# Patient Record
Sex: Female | Born: 1970 | Race: Black or African American | Hispanic: No | Marital: Married | Smoking: Former smoker
Health system: Southern US, Community
[De-identification: ages and names within clinical notes are randomized; demographics above are authoritative.]

## PROBLEM LIST (undated history)

## (undated) DIAGNOSIS — Z853 Personal history of malignant neoplasm of breast: Secondary | ICD-10-CM

## (undated) DIAGNOSIS — Z9221 Personal history of antineoplastic chemotherapy: Secondary | ICD-10-CM

## (undated) DIAGNOSIS — Z1379 Encounter for other screening for genetic and chromosomal anomalies: Secondary | ICD-10-CM

## (undated) HISTORY — DX: Encounter for other screening for genetic and chromosomal anomalies: Z13.79

---

## 1998-03-17 ENCOUNTER — Other Ambulatory Visit: Admission: RE | Admit: 1998-03-17 | Discharge: 1998-03-17 | Payer: Self-pay | Admitting: Obstetrics and Gynecology

## 1998-04-28 ENCOUNTER — Ambulatory Visit (HOSPITAL_COMMUNITY): Admission: AD | Admit: 1998-04-28 | Discharge: 1998-04-28 | Payer: Self-pay | Admitting: Obstetrics and Gynecology

## 1998-07-22 ENCOUNTER — Inpatient Hospital Stay (HOSPITAL_COMMUNITY): Admission: AD | Admit: 1998-07-22 | Discharge: 1998-07-22 | Payer: Self-pay | Admitting: Obstetrics and Gynecology

## 1998-08-03 ENCOUNTER — Inpatient Hospital Stay (HOSPITAL_COMMUNITY): Admission: AD | Admit: 1998-08-03 | Discharge: 1998-08-03 | Payer: Self-pay | Admitting: *Deleted

## 1998-08-04 ENCOUNTER — Inpatient Hospital Stay (HOSPITAL_COMMUNITY): Admission: AD | Admit: 1998-08-04 | Discharge: 1998-08-08 | Payer: Self-pay | Admitting: Obstetrics and Gynecology

## 1998-09-14 ENCOUNTER — Other Ambulatory Visit: Admission: RE | Admit: 1998-09-14 | Discharge: 1998-09-14 | Payer: Self-pay | Admitting: Obstetrics and Gynecology

## 1999-10-25 ENCOUNTER — Other Ambulatory Visit: Admission: RE | Admit: 1999-10-25 | Discharge: 1999-10-25 | Payer: Self-pay | Admitting: Obstetrics and Gynecology

## 2001-06-12 ENCOUNTER — Other Ambulatory Visit: Admission: RE | Admit: 2001-06-12 | Discharge: 2001-06-12 | Payer: Self-pay | Admitting: Obstetrics & Gynecology

## 2002-12-05 ENCOUNTER — Other Ambulatory Visit: Admission: RE | Admit: 2002-12-05 | Discharge: 2002-12-05 | Payer: Self-pay | Admitting: Obstetrics & Gynecology

## 2003-12-11 ENCOUNTER — Other Ambulatory Visit: Admission: RE | Admit: 2003-12-11 | Discharge: 2003-12-11 | Payer: Self-pay | Admitting: Obstetrics & Gynecology

## 2005-01-27 ENCOUNTER — Other Ambulatory Visit: Admission: RE | Admit: 2005-01-27 | Discharge: 2005-01-27 | Payer: Self-pay | Admitting: Obstetrics & Gynecology

## 2006-01-30 ENCOUNTER — Ambulatory Visit: Payer: Self-pay | Admitting: Family Medicine

## 2006-10-17 ENCOUNTER — Inpatient Hospital Stay (HOSPITAL_COMMUNITY): Admission: AD | Admit: 2006-10-17 | Discharge: 2006-10-17 | Payer: Self-pay | Admitting: Obstetrics and Gynecology

## 2007-03-14 ENCOUNTER — Ambulatory Visit (HOSPITAL_COMMUNITY): Admission: RE | Admit: 2007-03-14 | Discharge: 2007-03-14 | Payer: Self-pay | Admitting: Obstetrics & Gynecology

## 2007-05-28 ENCOUNTER — Inpatient Hospital Stay (HOSPITAL_COMMUNITY): Admission: RE | Admit: 2007-05-28 | Discharge: 2007-05-31 | Payer: Self-pay | Admitting: Obstetrics & Gynecology

## 2008-09-12 ENCOUNTER — Ambulatory Visit: Payer: Self-pay | Admitting: Family Medicine

## 2008-09-12 DIAGNOSIS — J029 Acute pharyngitis, unspecified: Secondary | ICD-10-CM | POA: Insufficient documentation

## 2008-09-17 ENCOUNTER — Encounter (INDEPENDENT_AMBULATORY_CARE_PROVIDER_SITE_OTHER): Payer: Self-pay | Admitting: *Deleted

## 2010-01-22 ENCOUNTER — Ambulatory Visit: Payer: Self-pay | Admitting: Family Medicine

## 2010-01-22 DIAGNOSIS — N760 Acute vaginitis: Secondary | ICD-10-CM | POA: Insufficient documentation

## 2010-01-22 DIAGNOSIS — R109 Unspecified abdominal pain: Secondary | ICD-10-CM | POA: Insufficient documentation

## 2010-01-22 LAB — CONVERTED CEMR LAB
Bilirubin Urine: NEGATIVE
Ketones, urine, test strip: NEGATIVE
Protein, U semiquant: NEGATIVE
Urobilinogen, UA: 0.2
pH: 5

## 2010-01-23 ENCOUNTER — Encounter: Payer: Self-pay | Admitting: Family Medicine

## 2010-01-25 ENCOUNTER — Telehealth (INDEPENDENT_AMBULATORY_CARE_PROVIDER_SITE_OTHER): Payer: Self-pay | Admitting: *Deleted

## 2010-01-25 LAB — CONVERTED CEMR LAB: Chlamydia, Swab/Urine, PCR: NEGATIVE

## 2010-02-12 ENCOUNTER — Encounter: Admission: RE | Admit: 2010-02-12 | Discharge: 2010-02-12 | Payer: Self-pay | Admitting: Family Medicine

## 2010-02-12 ENCOUNTER — Telehealth (INDEPENDENT_AMBULATORY_CARE_PROVIDER_SITE_OTHER): Payer: Self-pay | Admitting: *Deleted

## 2010-12-07 NOTE — Progress Notes (Signed)
Summary: Lab results   Phone Note Outgoing Call   Call placed by: Army Fossa CMA,  January 25, 2010 10:13 AM Reason for Call: Discuss lab or test results Summary of Call: Regarding results, LMTCB:  [place], GC probe, Culture- all normal  Follow-up for Phone Call        Pt is aware. Army Fossa CMA  January 27, 2010 1:22 PM

## 2010-12-07 NOTE — Progress Notes (Signed)
Summary: [place] Results  Phone Note Outgoing Call   Reason for Call: Discuss lab or test results Summary of Call: to discuss lab results  essentially negative   Signed by Loreen Freud DO on 02/12/2010 at 10:58 AM  LMTCB Harold Barban  February 12, 2010 3:20 PM  Initial call taken by: Harold Barban,  February 12, 2010 3:20 PM  Follow-up for Phone Call        [facility]. Army Fossa CMA  February 15, 2010 10:50 AM   Additional Follow-up for Phone Call Additional follow up Details #1::        Pt is aware. Army Fossa CMA  February 15, 2010 11:52 AM

## 2010-12-07 NOTE — Assessment & Plan Note (Signed)
Summary: pain during menstural cycle//lch   Vital Signs:  Patient profile:   40 year old female Height:      65 inches Weight:      141 pounds BMI:     23.55 Pulse rate:   82 / minute Pulse rhythm:   regular BP sitting:   102 / 70  (left arm) Cuff size:   regular  Vitals Entered By: Army Fossa CMA (January 22, 2010 2:59 PM) CC: Olivia Frederick c/o very painful cramps during period. x 2-3 months.    History of Present Illness: Olivia Frederick here c/o cramps with period .   It starts few days before and last about a week.  Periods only last about 3 days.  Olivia Frederick has taken Ibuprofen with little relief.   Pap in May was normal.  No d/c.     Current Medications (verified): 1)  Metrogel-Vaginal 0.75 % Gel (Metronidazole) .Marland Kitchen.. 1 Applicator Pv At Bedtime  Allergies (verified): No Known Drug Allergies  Past History:  Past medical, surgical, family and social histories (including risk factors) reviewed for relevance to current acute and chronic problems.  Family History: Reviewed history and no changes required.  Social History: Reviewed history and no changes required.  Review of Systems      See HPI  Physical Exam  General:  Well-developed,well-nourished,in no acute distress; alert,appropriate and cooperative throughout examination Abdomen:  Bowel sounds positive,abdomen soft and non-tender without masses, organomegaly or hernias noted. Genitalia:  normal introitus, no external lesions, normal uterus size and position, no adnexal masses or tenderness, and vaginal discharge.   Psych:  Oriented X3 and normally interactive.     Impression & Recommendations:  Problem # 1:  PELVIC PAIN, RIGHT (ICD-789.09)  Discussed use of medications, application of heat or cold, and exercises.   Orders: T-Culture, Urine ([id number]) T-GC Probe, urine [id number]) T-Chlamydia  Probe, urine [id number]) Wet Prep ([id number]) UA Dipstick w/o Micro (manual) (29528) Radiology Referral (Radiology)  Problem # 2:   VAGINITIS, BACTERIAL (ICD-616.10)  Her updated medication list for this problem includes:    Metrogel-vaginal 0.75 % Gel (Metronidazole) .Marland Kitchen... 1 applicator pv at bedtime  Discussed symptomatic relief and treatment options.   Complete Medication List: 1)  Metrogel-vaginal 0.75 % Gel (Metronidazole) .Marland Kitchen.. 1 applicator pv at bedtime Prescriptions: METROGEL-VAGINAL 0.75 % GEL (METRONIDAZOLE) 1 applicator pv at bedtime  #5 days x 0   Entered and Authorized by:   Loreen Freud DO   Signed by:   Loreen Freud DO on 01/22/2010   Method used:   Electronically to        [facility] [phone]* (retail)       [address]       [place], [place]  [address]       Ph: [phone]       Fax: [phone]   RxID:   [id number]   Laboratory Results   Urine Tests    Routine Urinalysis   Color: yellow Appearance: Clear Glucose: negative   (Normal Range: Negative) Bilirubin: negative   (Normal Range: Negative) Ketone: negative   (Normal Range: Negative) Spec. Gravity: 1.015   (Normal Range: 1.003-1.035) Blood: trace-lysed   (Normal Range: Negative) pH: 5.0   (Normal Range: 5.0-8.0) Protein: negative   (Normal Range: Negative) Urobilinogen: 0.2   (Normal Range: 0-1) Nitrite: negative   (Normal Range: Negative) Leukocyte Esterace: negative   (Normal Range: Negative)    Comments: Army Fossa CMA  January 22, 2010 3:29 PM     Appended Document: pain during menstural cycle//lch  Laboratory Results   Urine Tests   Date/Time Reported: January 22, 2010 3:49 PM     Urine HCG: negative Comments: Floydene Flock  January 22, 2010 3:50 PM

## 2011-03-25 NOTE — H&P (Signed)
Olivia Frederick, Olivia Frederick         ACCOUNT NO.:  [id number]   MEDICAL RECORD NO.:  [id number]          PATIENT TYPE:  INP   LOCATION:  NA                            [facility]   PHYSICIAN:  Freddy Finner, M.D.   DATE OF BIRTH:  1971-04-06   DATE OF ADMISSION:  DATE OF DISCHARGE:                              HISTORY & PHYSICAL   ADMISSION DIAGNOSES:  1. Intrauterine pregnancy at 38-1/[redacted] weeks gestation.  2. Surgically scarred uterus.  3. For repeat cesarean delivery.   The patient is a 40 year old gravida 2, para 1 whose prenatal has been  uncomplicated.  She delivered her first child by cesarean for placental  abruption.  She is admitted now for repeat cesarean delivery.  Her  prenatal course has been uncomplicated.   REVIEW OF SYSTEMS:  Cardiopulmonary, GI, GU are negative.   PAST MEDICAL HISTORY:  Recorded in the prenatal summary and not repeated  here.   FAMILY HISTORY:  Recorded in the prenatal summary and not repeated here.   PHYSICAL EXAMINATION:  HEENT:  Grossly within normal limits.  NECK:  The thyroid gland is not palpably enlarged.  VITAL SIGNS:  Blood pressure in the office 114/84.  CHEST:  Clear to auscultation.  HEART:  Normal sinus rhythm without murmurs, rubs, or gallops.  ABDOMEN:  Remarkable for gravid state.  Estimated fetal size of 8 pounds  or more.  EXTREMITIES:  +1 edema without cyanosis or clubbing.  PELVIC:  Exam is not performed.   ASSESSMENT:  1. Intrauterine pregnancy at term, uncomplicated prenatal course.  2. Surgically scarred uterus.   PLAN:  Repeat cesarean delivery.      Freddy Finner, M.D.  Electronically Signed     WRN/MEDQ  D:  05/24/2007  T:  05/24/2007  Job:  [id number]

## 2011-03-25 NOTE — Discharge Summary (Signed)
NAMEPAMELLA, SAMONS         ACCOUNT NO.:  [id number]   MEDICAL RECORD NO.:  [id number]          PATIENT TYPE:  INP   LOCATION:  9101                          [facility]   PHYSICIAN:  Freddy Finner, M.D.   DATE OF BIRTH:  May 24, 1971   DATE OF ADMISSION:  05/28/2007  DATE OF DISCHARGE:  05/31/2007                               DISCHARGE SUMMARY   ADMISSION DIAGNOSES:  1. Intrauterine pregnancy at term.  2. Previous cesarean delivery, desires repeat.   DISCHARGE DIAGNOSIS:  Status post low transverse cesarean section with a  viable female infant.   PROCEDURE:  Repeat low transverse cesarean section.   REASON FOR ADMISSION:  Please see dictated H&P.   HOSPITAL COURSE:  The patient 40 year old black married female gravida  2, para 1 that was admitted to [facility] all for scheduled  cesarean section.  The patient had had a previous cesarean delivery and  desired repeat.  On the morning of admission the patient was taken to  operating room where spinal anesthesia was administered without  difficulty.  Low transverse incision was made with delivery of a viable  female infant weighing 7 pounds 14 ounces with Apgars of 9 at 1 and 9 at  5 minutes.  The patient tolerated procedure well and taken to the  recovery room in stable condition.  On postoperative day #1 the patient  was without complaint.  Vital signs were stable.  She is afebrile.  Abdomen soft with good return of bowel function.  Fundus was firm and  nontender.  Abdominal dressing was noted to be clean, dry and intact.  Laboratory findings revealed hemoglobin of 8.7.  CBC was ordered for the  following a.m.  On postoperative day #2 the patient was without  complaint.  She denied any dizziness.  Vital signs were stable.  Heart  rate was 71-83.  Abdomen soft.  Fundus firm and nontender.  Incision was  noted to have a small amount of oozing noted in the midpoint.  She is  ambulating well.  Laboratory findings  revealed hemoglobin of 7.4,  platelet count 175,000, WBC count of 9.3.  The patient was started on  some iron supplementation and CBC was ordered for the following morning.  On postoperative day #3 the patient did complain of some peripheral  edema.  She denied headache or blurred vision.  Vital signs were stable.  She was afebrile.  Blood pressure was 127/71.  Abdomen soft.  Fundus  firm and even incision was noted to have a scant amount of moisture  noted from the midpoint.  Staples were left in place. The patient was  ambulating well, tolerating a regular diet without complaints of nausea,  vomiting.  Laboratory findings revealed hemoglobin of 7.8, platelet  count 199,000, WBC count of 9.4.  Instructions were reviewed and the  patient was later discharged home.   CONDITION ON DISCHARGE:  Stable, diet regular as tolerated.   ACTIVITY:  No heavy lifting, no driving x2 weeks.  A vaginal entry.   FOLLOW-UP:  She is to follow up in the office in 3-4 days for staple  removal.  She is to call for temperature greater than 100 degrees,  persistent nausea, vomiting, heavy vaginal bleeding and/or redness or  drainage from incisional site.   DISCHARGE MEDICATIONS:  1. Percocet 5/325 #30 one p.o. q.4-6 hours.  2. Motrin 600 mg every 6 hours.  3. Prenatal vitamins one p.o. daily.  4. Tandem one p.o. daily p.r.n.      Julio Sicks, N.P.      Freddy Finner, M.D.  Electronically Signed    CC/MEDQ  D:  06/21/2007  T:  06/21/2007  Job:  [id number]

## 2011-03-25 NOTE — Op Note (Signed)
Olivia Frederick, Olivia Frederick         ACCOUNT NO.:  [id number]   MEDICAL RECORD NO.:  [id number]          PATIENT TYPE:  INP   LOCATION:  9101                          [facility]   PHYSICIAN:  Freddy Finner, M.D.   DATE OF BIRTH:  [birth year]   DATE OF PROCEDURE:  05/28/2007  DATE OF DISCHARGE:                               OPERATIVE REPORT   PREOPERATIVE DIAGNOSES:  1. Intrauterine pregnancy at term.  2. Surgically scarred uterus by previous cesarean delivery.   POSTOPERATIVE DIAGNOSES:  1. Intrauterine pregnancy at term.  2. Surgically scarred uterus by previous cesarean delivery.  3. Subserosal leiomyomata.   OPERATIVE PROCEDURE:  Repeat low transverse cervical cesarean section  with delivery of viable female infant, Apgars of 9 and 9, birth weight 7  pounds 14 ounces.   SURGEON:  Freddy Finner, M.D.   ANESTHESIA:  Spinal.   ESTIMATED INTRAOPERATIVE BLOOD LOSS:  Less than or equal to 800 mL.   INTRAOPERATIVE COMPLICATIONS:  None.   The patient is a 40 year old black married female, gravida 2, para 1 by  cesarean, who has been followed through an uneventful prenatal course  and is admitted now at 38-1/2 weeks' gestation for repeat cesarean  delivery.  She was admitted on the morning of surgery.  She was taken to  the operating room, there placed under adequate spinal anesthesia,  placed in the dorsal recumbent position with elevation of the right hip  by 15 degrees.  Abdomen was prepped and draped in the usual fashion,  then Foley catheter placed using sterile technique.  A transverse lower  abdominal incision was made through an old scar and carried sharply down  to the fascia.  The fascia was entered sharply and extended to the  extent of skin incision.  Rectus sheath was developed superiorly and  inferiorly with blunt and sharp dissection.  Rectus muscles were divided  in the midline, peritoneum was elevated and entered sharply and extended  bluntly to the extent of  skin incision.  The old scar could be  visualized above the bladder reflection and for that reason, the bladder  flap was not developed.  A transverse incision was made in the lower  uterine segment, then extended into the amnion.  The fluid was clear.  The incision was extended bluntly in a transverse direction.  A Kiwi  vacuum with assist device was then used and easy delivery of the fetal  head was accomplished without difficulty.  A single loose loop of nuchal  cord was reduced.  The nostrils and mouth were bulbed before delivery of  the shoulders.  A viable female infant was then delivered.  Statistics  were noted above.  The placenta and other products of conception were  removed and taken off the table for placental cord donation.  The uterus  was delivered onto the anterior abdominal wall.  There were two small  subserosal leiomyomata measuring less than 6 cm each.  Both were on the  right side and one along the right posterior surface of the fundus.  Careful examination of the uterine cavity confirmed complete evacuation  of products of  conception.  The incision was then closed in a double  layer with running locking Monocryl or the first layer and an  imbricating suture of 0 Monocryl for the second layer.  The uterus was  delivered back into the abdominal cavity.  Hemostasis was complete.  Irrigation was carried out.  All pack, needle and instrument counts were  correct.  The abdominal incision was then closed in layers.  Running 0  Monocryl was used to close the peritoneum and reapproximate the rectus  muscles.  The fascia was closed with running 0 PDS running from angle to  angle on either side.  The subcutaneous tissue was approximated with a  running 2-0 plain suture.  The skin was closed with broad skin staples  and quarter-inch Steri-Strips.  The patient tolerated the operative  procedure well.  She was taken to recovery in good condition.      Freddy Finner, M.D.   Electronically Signed     WRN/MEDQ  D:  05/28/2007  T:  05/28/2007  Job:  [id number]

## 2011-06-10 ENCOUNTER — Ambulatory Visit: Payer: Self-pay | Admitting: Internal Medicine

## 2011-06-10 ENCOUNTER — Encounter: Payer: Self-pay | Admitting: Internal Medicine

## 2011-06-10 ENCOUNTER — Ambulatory Visit (INDEPENDENT_AMBULATORY_CARE_PROVIDER_SITE_OTHER): Payer: 59 | Admitting: Internal Medicine

## 2011-06-10 VITALS — BP 98/66 | HR 61 | Temp 98.6°F | Wt 147.2 lb

## 2011-06-10 DIAGNOSIS — H9190 Unspecified hearing loss, unspecified ear: Secondary | ICD-10-CM

## 2011-06-10 DIAGNOSIS — H6120 Impacted cerumen, unspecified ear: Secondary | ICD-10-CM

## 2011-06-10 DIAGNOSIS — H6122 Impacted cerumen, left ear: Secondary | ICD-10-CM

## 2011-06-10 DIAGNOSIS — H9192 Unspecified hearing loss, left ear: Secondary | ICD-10-CM

## 2011-06-10 NOTE — Progress Notes (Signed)
  Subjective:    Patient ID: Olivia Frederick, female    DOB: 1971/07/17, 40 y.o.   MRN: [id number]  HPI she has had a hearing loss on the left which began while she was at the beach and swimming in the ocean. She has had some minor frontal headaches. She denies facial pain, nasal obstruction/congestion, purulent discharge, dental pain, lymphadenopathy, fatigue, or loss of smell.   Review of Systems     Objective:   Physical Exam on exam she is healthy-appearing in no distress.  Nares are patent with no exudates or purulence.  Dental  hygiene is excellent. The oropharynx reveals no erythema or exudate.  She has no significant cervical or axillary lymphadenopathy.  The right tympanic membrane is slightly dull but otherwise normal. There is a large left cerumen impaction present. There is significant hearing loss on the left        Assessment & Plan:  #1 hearing loss due to  cerumen impaction  Plan: soaking & gavage with hearing retesting.. After gavage L canal & TM examined; no significant changes present. Hearing normal.

## 2011-06-10 NOTE — Patient Instructions (Signed)
Please do not use Q-tips as we discussed. Should wax build up occur, please put 2-3 drops of mineral oil in the ear at night and cover the canal with a  cotton ball. In the morning fill the canal with hydrogen peroxide & leave  for 10-15 minutes. Following this shower and use the thinnest washrag available to wick out the wax.  

## 2011-07-05 ENCOUNTER — Ambulatory Visit (INDEPENDENT_AMBULATORY_CARE_PROVIDER_SITE_OTHER): Payer: 59 | Admitting: Family Medicine

## 2011-07-05 ENCOUNTER — Encounter: Payer: Self-pay | Admitting: Family Medicine

## 2011-07-05 VITALS — BP 120/72 | HR 63 | Temp 99.1°F | Wt 144.8 lb

## 2011-07-05 DIAGNOSIS — Z0100 Encounter for examination of eyes and vision without abnormal findings: Secondary | ICD-10-CM

## 2011-07-05 DIAGNOSIS — H109 Unspecified conjunctivitis: Secondary | ICD-10-CM

## 2011-07-05 MED ORDER — MOXIFLOXACIN HCL 0.5 % OP SOLN: 1.0000 [drp] | Freq: Three times a day (TID) | OPHTHALMIC | Status: AC

## 2011-07-05 NOTE — Progress Notes (Signed)
  Subjective:    Olivia Frederick is a 41 y.o. female who presents for evaluation of discharge, erythema, itching and tearing in the right eye. She has noticed the above symptoms for 4 months. Onset was gradual. Patient denies blurred vision, foreign body sensation, pain, photophobia and visual field deficit. There is a history of wearing glasses and had her makeup done at dept store in May.  The following portions of the patient's history were reviewed and updated as appropriate: allergies, current medications, past family history, past medical history, past social history, past surgical history and problem list.  Review of Systems Pertinent items are noted in HPI.   Objective:    BP 120/72  Pulse 63  Temp(Src) 99.1 F (37.3 C) (Oral)  Wt 144 lb 12.8 oz (65.681 kg)  SpO2 97%      General: alert, cooperative, appears stated age and no distress  Eyes:  negative findings: L eye normal, positive findings: conjunctiva: 2+ injection, injected bacterial conjunctivitis and sclera red  Vision: Uncorrected:            L  20/50            R 20/50  Fluorescein:  not done     Assessment:    Acute conjunctivitis   Plan:    Discussed the diagnosis and proper care of conjunctivitis.  Stressed household Presenter, broadcasting. Ophthalmic drops per orders. Warm compress to eye(s).  F/u eye doctor in 2-3 days if no better

## 2011-07-12 ENCOUNTER — Telehealth: Payer: Self-pay

## 2011-07-12 DIAGNOSIS — H44009 Unspecified purulent endophthalmitis, unspecified eye: Secondary | ICD-10-CM

## 2011-07-12 NOTE — Telephone Encounter (Signed)
She needs referral to optho

## 2011-07-12 NOTE — Telephone Encounter (Signed)
Discussed with patient referral put in     [place]

## 2011-07-12 NOTE — Telephone Encounter (Signed)
Call from patient and she sated she was told to call back if the medicatoin did not work on her eye, she says she has something inside of her eye, says it looks like a white pimple is in her eye and it is still red. She thinks she goes to [facility] eye care on [address]. Please advise    KP

## 2011-08-22 LAB — URINALYSIS, ROUTINE W REFLEX MICROSCOPIC
Glucose, UA: NEGATIVE
Nitrite: NEGATIVE
Protein, ur: NEGATIVE
Specific Gravity, Urine: <1.005 — ABNORMAL LOW
pH: 6.5

## 2011-08-22 LAB — CBC
HCT: 21.5 — ABNORMAL LOW
HCT: 22.2 — ABNORMAL LOW
HCT: 25.4 — ABNORMAL LOW
HCT: 35.3 — ABNORMAL LOW
Hemoglobin: 12.3
Hemoglobin: 7.4 — CL
Hemoglobin: 7.8 — CL
MCHC: 34.9
MCV: 82.1
MCV: 83.3
Platelets: 163
Platelets: 175
Platelets: 188
Platelets: 199
RBC: 2.66 — ABNORMAL LOW
RDW: 13.7
RDW: 13.7
WBC: 9.4
WBC: 9.9

## 2011-08-22 LAB — RPR: RPR Ser Ql: NONREACTIVE

## 2011-08-22 LAB — RH IMMUNE GLOB WKUP(>/=20WKS)(NOT WOMEN'S HOSP)

## 2012-11-28 ENCOUNTER — Other Ambulatory Visit: Payer: Self-pay | Admitting: Obstetrics & Gynecology

## 2012-11-28 DIAGNOSIS — R928 Other abnormal and inconclusive findings on diagnostic imaging of breast: Secondary | ICD-10-CM

## 2012-12-07 ENCOUNTER — Ambulatory Visit
Admission: RE | Admit: 2012-12-07 | Discharge: 2012-12-07 | Disposition: A | Discharge status: Home / Self Care | Payer: 59 | Source: Ambulatory Visit | Attending: Obstetrics & Gynecology | Admitting: Obstetrics & Gynecology

## 2012-12-07 DIAGNOSIS — R928 Other abnormal and inconclusive findings on diagnostic imaging of breast: Secondary | ICD-10-CM

## 2013-08-16 ENCOUNTER — Ambulatory Visit (INDEPENDENT_AMBULATORY_CARE_PROVIDER_SITE_OTHER): Payer: 59 | Admitting: Family Medicine

## 2013-08-16 ENCOUNTER — Encounter: Payer: Self-pay | Admitting: Family Medicine

## 2013-08-16 VITALS — BP 118/74 | HR 78 | Temp 98.3°F | Wt 146.6 lb

## 2013-08-16 DIAGNOSIS — J029 Acute pharyngitis, unspecified: Secondary | ICD-10-CM

## 2013-08-16 DIAGNOSIS — J020 Streptococcal pharyngitis: Secondary | ICD-10-CM

## 2013-08-16 MED ORDER — PENICILLIN G BENZATHINE 1200000 UNIT/2ML IM SUSP
1.2000 10*6.[IU] | Freq: Once | INTRAMUSCULAR | Status: AC
  Administered 2013-08-16: 1.2 10*6.[IU] via INTRAMUSCULAR

## 2013-08-16 NOTE — Progress Notes (Signed)
  Subjective:     Olivia Frederick is a 42 y.o. female who presents for evaluation of sore throat. Associated symptoms include nasal blockage, post nasal drip, sinus and nasal congestion, sore throat and swollen glands. Onset of symptoms was 2 weeks ago, and have been gradually worsening since that time. She is drinking plenty of fluids. She has not had a recent close exposure to someone with proven streptococcal pharyngitis.  The following portions of the patient's history were reviewed and updated as appropriate: allergies, current medications, past family history, past medical history, past social history, past surgical history and problem list.  Review of Systems Pertinent items are noted in HPI.    Objective:    BP 118/74  Pulse 78  Temp(Src) 98.3 F (36.8 C) (Oral)  Wt 146 lb 9.6 oz (66.497 kg)  BMI 24.4 kg/m2  SpO2 98% General appearance: alert, cooperative, appears stated age and no distress Ears: normal TM's and external ear canals both ears Nose: Nares normal. Septum midline. Mucosa normal. No drainage or sinus tenderness. Throat: abnormal findings: exudates present and marked oropharyngeal erythema Neck: moderate anterior cervical adenopathy, supple, symmetrical, trachea midline and thyroid not enlarged, symmetric, no tenderness/mass/nodules Lungs: clear to auscultation bilaterally  Laboratory Strep test done. Results:positive.    Assessment:    Acute pharyngitis, likely  Strep throat.    Plan:    Patient placed on antibiotics. Follow up as needed. Follow up in several days. -- if needed

## 2013-08-16 NOTE — Addendum Note (Signed)
Addended by: Arnette Norris on: 08/16/2013 05:41 PM   Modules accepted: Orders

## 2013-08-16 NOTE — Addendum Note (Signed)
Addended by: Arnette Norris on: 08/16/2013 05:42 PM   Modules accepted: Orders

## 2013-08-16 NOTE — Patient Instructions (Signed)
Strep Throat  Strep throat is an infection of the throat caused by a bacteria named Streptococcus pyogenes. Your caregiver may call the infection streptococcal "tonsillitis" or "pharyngitis" depending on whether there are signs of inflammation in the tonsils or back of the throat. Strep throat is most common in children aged 40 15 years during the cold months of the year, but it can occur in people of any age during any season. This infection is spread from person to person (contagious) through coughing, sneezing, or other close contact.  SYMPTOMS   · Fever or chills.  · Painful, swollen, red tonsils or throat.  · Pain or difficulty when swallowing.  · White or yellow spots on the tonsils or throat.  · Swollen, tender lymph nodes or "glands" of the neck or under the jaw.  · Red rash all over the body (rare).  DIAGNOSIS   Many different infections can cause the same symptoms. A test must be done to confirm the diagnosis so the right treatment can be given. A "rapid strep test" can help your caregiver make the diagnosis in a few minutes. If this test is not available, a light swab of the infected area can be used for a throat culture test. If a throat culture test is done, results are usually available in a day or two.  TREATMENT   Strep throat is treated with antibiotic medicine.  HOME CARE INSTRUCTIONS   · Gargle with 1 tsp of salt in 1 cup of warm water, 3 4 times per day or as needed for comfort.  · Family members who also have a sore throat or fever should be tested for strep throat and treated with antibiotics if they have the strep infection.  · Make sure everyone in your household washes their hands well.  · Do not share food, drinking cups, or personal items that could cause the infection to spread to others.  · You may need to eat a soft food diet until your sore throat gets better.  · Drink enough water and fluids to keep your urine clear or pale yellow. This will help prevent dehydration.  · Get plenty of  rest.  · Stay home from school, daycare, or work until you have been on antibiotics for 24 hours.  · Only take over-the-counter or prescription medicines for pain, discomfort, or fever as directed by your caregiver.  · If antibiotics are prescribed, take them as directed. Finish them even if you start to feel better.  SEEK MEDICAL CARE IF:   · The glands in your neck continue to enlarge.  · You develop a rash, cough, or earache.  · You cough up green, yellow-brown, or bloody sputum.  · You have pain or discomfort not controlled by medicines.  · Your problems seem to be getting worse rather than better.  SEEK IMMEDIATE MEDICAL CARE IF:   · You develop any new symptoms such as vomiting, severe headache, stiff or painful neck, chest pain, shortness of breath, or trouble swallowing.  · You develop severe throat pain, drooling, or changes in your voice.  · You develop swelling of the neck, or the skin on the neck becomes red and tender.  · You have a fever.  · You develop signs of dehydration, such as fatigue, dry mouth, and decreased urination.  · You become increasingly sleepy, or you cannot wake up completely.  Document Released: 10/21/2000 Document Revised: 10/10/2012 Document Reviewed: 12/23/2010  [employer]® Patient Information ©2014 [employer].

## 2014-07-29 ENCOUNTER — Ambulatory Visit (INDEPENDENT_AMBULATORY_CARE_PROVIDER_SITE_OTHER): Payer: 59 | Admitting: Family Medicine

## 2014-07-29 ENCOUNTER — Encounter: Payer: Self-pay | Admitting: Family Medicine

## 2014-07-29 ENCOUNTER — Ambulatory Visit: Payer: 59 | Admitting: Family Medicine

## 2014-07-29 VITALS — BP 129/76 | HR 63 | Temp 98.0°F | Wt 147.5 lb

## 2014-07-29 DIAGNOSIS — M545 Low back pain, unspecified: Secondary | ICD-10-CM

## 2014-07-29 MED ORDER — HYDROCODONE-ACETAMINOPHEN 5-325MG PREPACK (~~LOC~~: ORAL_TABLET | ORAL | Status: DC

## 2014-07-29 MED ORDER — CYCLOBENZAPRINE HCL 10 MG PO TABS: 10.0000 mg | ORAL_TABLET | Freq: Three times a day (TID) | ORAL | Status: DC | PRN

## 2014-07-29 NOTE — Patient Instructions (Signed)
Back Pain, Adult Low back pain is very common. About 1 in 5 people have back pain.The cause of low back pain is rarely dangerous. The pain often gets better over time.About half of people with a sudden onset of back pain feel better in just 2 weeks. About 8 in 10 people feel better by 6 weeks.  CAUSES Some common causes of back pain include:  Strain of the muscles or ligaments supporting the spine.  Wear and tear (degeneration) of the spinal discs.  Arthritis.  Direct injury to the back. DIAGNOSIS Most of the time, the direct cause of low back pain is not known.However, back pain can be treated effectively even when the exact cause of the pain is unknown.Answering your caregiver's questions about your overall health and symptoms is one of the most accurate ways to make sure the cause of your pain is not dangerous. If your caregiver needs more information, he or she may order lab work or imaging tests (X-rays or MRIs).However, even if imaging tests show changes in your back, this usually does not require surgery. HOME CARE INSTRUCTIONS For many people, back pain returns.Since low back pain is rarely dangerous, it is often a condition that people can learn to manageon their own.   Remain active. It is stressful on the back to sit or stand in one place. Do not sit, drive, or stand in one place for more than 30 minutes at a time. Take short walks on level surfaces as soon as pain allows.Try to increase the length of time you walk each day.  Do not stay in bed.Resting more than 1 or 2 days can delay your recovery.  Do not avoid exercise or work.Your body is made to move.It is not dangerous to be active, even though your back may hurt.Your back will likely heal faster if you return to being active before your pain is gone.  Pay attention to your body when you bend and lift. Many people have less discomfortwhen lifting if they bend their knees, keep the load close to their bodies,and  avoid twisting. Often, the most comfortable positions are those that put less stress on your recovering back.  Find a comfortable position to sleep. Use a firm mattress and lie on your side with your knees slightly bent. If you lie on your back, put a pillow under your knees.  Only take over-the-counter or prescription medicines as directed by your caregiver. Over-the-counter medicines to reduce pain and inflammation are often the most helpful.Your caregiver may prescribe muscle relaxant drugs.These medicines help dull your pain so you can more quickly return to your normal activities and healthy exercise.  Put ice on the injured area.  Put ice in a plastic bag.  Place a towel between your skin and the bag.  Leave the ice on for 15-20 minutes, 03-04 times a day for the first 2 to 3 days. After that, ice and heat may be alternated to reduce pain and spasms.  Ask your caregiver about trying back exercises and gentle massage. This may be of some benefit.  Avoid feeling anxious or stressed.Stress increases muscle tension and can worsen back pain.It is important to recognize when you are anxious or stressed and learn ways to manage it.Exercise is a great option. SEEK MEDICAL CARE IF:  You have pain that is not relieved with rest or medicine.  You have pain that does not improve in 1 week.  You have new symptoms.  You are generally not feeling well. SEEK   IMMEDIATE MEDICAL CARE IF:   You have pain that radiates from your back into your legs.  You develop new bowel or bladder control problems.  You have unusual weakness or numbness in your arms or legs.  You develop nausea or vomiting.  You develop abdominal pain.  You feel faint. Document Released: 10/24/2005 Document Revised: 04/24/2012 Document Reviewed: 02/25/2014 [employer] Patient Information 2015 [employer]. This information is not intended to replace advice given to you by your health care provider. Make sure you  discuss any questions you have with your health care provider.  

## 2014-07-29 NOTE — Progress Notes (Signed)
Pre visit review using our clinic review tool, if applicable. No additional management support is needed unless otherwise documented below in the visit note. 

## 2014-07-29 NOTE — Progress Notes (Signed)
  Subjective:    FLORENDA WATT is a 43 y.o. female who presents for evaluation of low back pain. The patient has had no prior back problems. Symptoms have been present for 3 days and are gradually worsening.  Onset was related to / precipitated by no known injury. The pain is located in the left lumbar area and radiates to the left hip. The pain is described as aching and sharp and occurs all day. She rates her pain as a 8 on a scale of 0-10. Symptoms are exacerbated by sitting, standing and twisting. Symptoms are improved by nothing. She has also tried acetaminophen, change in body position, heat, ice, NSAIDs, rest and sleep which provided no symptom relief. She has no other symptoms associated with the back pain. The patient has no "red flag" history indicative of complicated back pain.  The following portions of the patient's history were reviewed and updated as appropriate: allergies, current medications, past family history, past medical history, past social history, past surgical history and problem list.  Review of Systems Pertinent items are noted in HPI.    Objective:   Inspection and palpation: paraspinal tenderness noted R mid= low back, antalgic gait. Muscle tone and ROM exam: muscle spasm noted r mid to low back. Straight leg raise: positive at 10 degrees on the left. Neurological: normal DTRs, muscle strength and reflexes.    Assessment:    Nonspecific acute low back pain    Plan:    Natural history and expected course discussed. Questions answered. Neurosurgeon distributed. Regular aerobic and trunk strengthening exercises discussed. Short (2-4 day) period of relative rest recommended until acute symptoms improve. Ice to affected area as needed for local pain relief. Heat to affected area as needed for local pain relief. Muscle relaxants per medication orders. Follow-up in 2 weeks. ---prn

## 2014-11-13 ENCOUNTER — Ambulatory Visit (INDEPENDENT_AMBULATORY_CARE_PROVIDER_SITE_OTHER): Payer: 59 | Admitting: Family Medicine

## 2014-11-13 ENCOUNTER — Other Ambulatory Visit (HOSPITAL_COMMUNITY)
Admission: RE | Admit: 2014-11-13 | Discharge: 2014-11-13 | Disposition: A | Discharge status: Home / Self Care | Payer: 59 | Source: Ambulatory Visit | Attending: Family Medicine | Admitting: Family Medicine

## 2014-11-13 ENCOUNTER — Encounter: Payer: Self-pay | Admitting: Family Medicine

## 2014-11-13 VITALS — BP 120/78 | HR 63 | Temp 98.2°F | Resp 16 | Ht 66.0 in | Wt 147.2 lb

## 2014-11-13 DIAGNOSIS — Z1151 Encounter for screening for human papillomavirus (HPV): Secondary | ICD-10-CM | POA: Insufficient documentation

## 2014-11-13 DIAGNOSIS — Z1239 Encounter for other screening for malignant neoplasm of breast: Secondary | ICD-10-CM

## 2014-11-13 DIAGNOSIS — Z124 Encounter for screening for malignant neoplasm of cervix: Secondary | ICD-10-CM

## 2014-11-13 DIAGNOSIS — Z01419 Encounter for gynecological examination (general) (routine) without abnormal findings: Secondary | ICD-10-CM | POA: Insufficient documentation

## 2014-11-13 DIAGNOSIS — Z0000 Encounter for general adult medical examination without abnormal findings: Secondary | ICD-10-CM

## 2014-11-13 NOTE — Patient Instructions (Signed)
Preventive Care for Adults A healthy lifestyle and preventive care can promote health and wellness. Preventive health guidelines for women include the following key practices.  A routine yearly physical is a good way to check with your health care provider about your health and preventive screening. It is a chance to share any concerns and updates on your health and to receive a thorough exam.  Visit your dentist for a routine exam and preventive care every 6 months. Brush your teeth twice a day and floss once a day. Good oral hygiene prevents tooth decay and gum disease.  The frequency of eye exams is based on your age, health, family medical history, use of contact lenses, and other factors. Follow your health care provider's recommendations for frequency of eye exams.  Eat a healthy diet. Foods like vegetables, fruits, whole grains, low-fat dairy products, and lean protein foods contain the nutrients you need without too many calories. Decrease your intake of foods high in solid fats, added sugars, and salt. Eat the right amount of calories for you.Get information about a proper diet from your health care provider, if necessary.  Regular physical exercise is one of the most important things you can do for your health. Most adults should get at least 150 minutes of moderate-intensity exercise (any activity that increases your heart rate and causes you to sweat) each week. In addition, most adults need muscle-strengthening exercises on 2 or more days a week.  Maintain a healthy weight. The body mass index (BMI) is a screening tool to identify possible weight problems. It provides an estimate of body fat based on height and weight. Your health care provider can find your BMI and can help you achieve or maintain a healthy weight.For adults 20 years and older:  A BMI below 18.5 is considered underweight.  A BMI of 18.5 to 24.9 is normal.  A BMI of 25 to 29.9 is considered overweight.  A BMI of  30 and above is considered obese.  Maintain normal blood lipids and cholesterol levels by exercising and minimizing your intake of saturated fat. Eat a balanced diet with plenty of fruit and vegetables. Blood tests for lipids and cholesterol should begin at age 76 and be repeated every 5 years. If your lipid or cholesterol levels are high, you are over 50, or you are at high risk for heart disease, you may need your cholesterol levels checked more frequently.Ongoing high lipid and cholesterol levels should be treated with medicines if diet and exercise are not working.  If you smoke, find out from your health care provider how to quit. If you do not use tobacco, do not start.  Lung cancer screening is recommended for adults aged 22-80 years who are at high risk for developing lung cancer because of a history of smoking. A yearly low-dose CT scan of the lungs is recommended for people who have at least a 30-pack-year history of smoking and are a current smoker or have quit within the past 15 years. A pack year of smoking is smoking an average of 1 pack of cigarettes a day for 1 year (for example: 1 pack a day for 30 years or 2 packs a day for 15 years). Yearly screening should continue until the smoker has stopped smoking for at least 15 years. Yearly screening should be stopped for people who develop a health problem that would prevent them from having lung cancer treatment.  If you are pregnant, do not drink alcohol. If you are breastfeeding,  be very cautious about drinking alcohol. If you are not pregnant and choose to drink alcohol, do not have more than 1 drink per day. One drink is considered to be 12 ounces (355 mL) of beer, 5 ounces (148 mL) of wine, or 1.5 ounces (44 mL) of liquor.  Avoid use of street drugs. Do not share needles with anyone. Ask for help if you need support or instructions about stopping the use of drugs.  High blood pressure causes heart disease and increases the risk of  stroke. Your blood pressure should be checked at least every 1 to 2 years. Ongoing high blood pressure should be treated with medicines if weight loss and exercise do not work.  If you are 75-52 years old, ask your health care provider if you should take aspirin to prevent strokes.  Diabetes screening involves taking a blood sample to check your fasting blood sugar level. This should be done once every 3 years, after age 15, if you are within normal weight and without risk factors for diabetes. Testing should be considered at a younger age or be carried out more frequently if you are overweight and have at least 1 risk factor for diabetes.  Breast cancer screening is essential preventive care for women. You should practice "breast self-awareness." This means understanding the normal appearance and feel of your breasts and may include breast self-examination. Any changes detected, no matter how small, should be reported to a health care provider. Women in their 58s and 30s should have a clinical breast exam (CBE) by a health care provider as part of a regular health exam every 1 to 3 years. After age 16, women should have a CBE every year. Starting at age 53, women should consider having a mammogram (breast X-ray test) every year. Women who have a family history of breast cancer should talk to their health care provider about genetic screening. Women at a high risk of breast cancer should talk to their health care providers about having an MRI and a mammogram every year.  Breast cancer gene (BRCA)-related cancer risk assessment is recommended for women who have family members with BRCA-related cancers. BRCA-related cancers include breast, ovarian, tubal, and peritoneal cancers. Having family members with these cancers may be associated with an increased risk for harmful changes (mutations) in the breast cancer genes BRCA1 and BRCA2. Results of the assessment will determine the need for genetic counseling and  BRCA1 and BRCA2 testing.  Routine pelvic exams to screen for cancer are no longer recommended for nonpregnant women who are considered low risk for cancer of the pelvic organs (ovaries, uterus, and vagina) and who do not have symptoms. Ask your health care provider if a screening pelvic exam is right for you.  If you have had past treatment for cervical cancer or a condition that could lead to cancer, you need Pap tests and screening for cancer for at least 20 years after your treatment. If Pap tests have been discontinued, your risk factors (such as having a new sexual partner) need to be reassessed to determine if screening should be resumed. Some women have medical problems that increase the chance of getting cervical cancer. In these cases, your health care provider may recommend more frequent screening and Pap tests.  The HPV test is an additional test that may be used for cervical cancer screening. The HPV test looks for the virus that can cause the cell changes on the cervix. The cells collected during the Pap test can be  tested for HPV. The HPV test could be used to screen women aged 30 years and older, and should be used in women of any age who have unclear Pap test results. After the age of 30, women should have HPV testing at the same frequency as a Pap test.  Colorectal cancer can be detected and often prevented. Most routine colorectal cancer screening begins at the age of 50 years and continues through age 75 years. However, your health care provider may recommend screening at an earlier age if you have risk factors for colon cancer. On a yearly basis, your health care provider may provide home test kits to check for hidden blood in the stool. Use of a small camera at the end of a tube, to directly examine the colon (sigmoidoscopy or colonoscopy), can detect the earliest forms of colorectal cancer. Talk to your health care provider about this at age 50, when routine screening begins. Direct  exam of the colon should be repeated every 5-10 years through age 75 years, unless early forms of pre-cancerous polyps or small growths are found.  People who are at an increased risk for hepatitis B should be screened for this virus. You are considered at high risk for hepatitis B if:  You were born in a country where hepatitis B occurs often. Talk with your health care provider about which countries are considered high risk.  Your parents were born in a high-risk country and you have not received a shot to protect against hepatitis B (hepatitis B vaccine).  You have HIV or AIDS.  You use needles to inject street drugs.  You live with, or have sex with, someone who has hepatitis B.  You get hemodialysis treatment.  You take certain medicines for conditions like cancer, organ transplantation, and autoimmune conditions.  Hepatitis C blood testing is recommended for all people born from 1945 through 1965 and any individual with known risks for hepatitis C.  Practice safe sex. Use condoms and avoid high-risk sexual practices to reduce the spread of sexually transmitted infections (STIs). STIs include gonorrhea, chlamydia, syphilis, trichomonas, herpes, HPV, and human immunodeficiency virus (HIV). Herpes, HIV, and HPV are viral illnesses that have no cure. They can result in disability, cancer, and death.  You should be screened for sexually transmitted illnesses (STIs) including gonorrhea and chlamydia if:  You are sexually active and are younger than 24 years.  You are older than 24 years and your health care provider tells you that you are at risk for this type of infection.  Your sexual activity has changed since you were last screened and you are at an increased risk for chlamydia or gonorrhea. Ask your health care provider if you are at risk.  If you are at risk of being infected with HIV, it is recommended that you take a prescription medicine daily to prevent HIV infection. This is  called preexposure prophylaxis (PrEP). You are considered at risk if:  You are a heterosexual woman, are sexually active, and are at increased risk for HIV infection.  You take drugs by injection.  You are sexually active with a partner who has HIV.  Talk with your health care provider about whether you are at high risk of being infected with HIV. If you choose to begin PrEP, you should first be tested for HIV. You should then be tested every 3 months for as long as you are taking PrEP.  Osteoporosis is a disease in which the bones lose minerals and strength   with aging. This can result in serious bone fractures or breaks. The risk of osteoporosis can be identified using a bone density scan. Women ages 65 years and over and women at risk for fractures or osteoporosis should discuss screening with their health care providers. Ask your health care provider whether you should take a calcium supplement or vitamin D to reduce the rate of osteoporosis.  Menopause can be associated with physical symptoms and risks. Hormone replacement therapy is available to decrease symptoms and risks. You should talk to your health care provider about whether hormone replacement therapy is right for you.  Use sunscreen. Apply sunscreen liberally and repeatedly throughout the day. You should seek shade when your shadow is shorter than you. Protect yourself by wearing long sleeves, pants, a wide-brimmed hat, and sunglasses year round, whenever you are outdoors.  Once a month, do a whole body skin exam, using a mirror to look at the skin on your back. Tell your health care provider of new moles, moles that have irregular borders, moles that are larger than a pencil eraser, or moles that have changed in shape or color.  Stay current with required vaccines (immunizations).  Influenza vaccine. All adults should be immunized every year.  Tetanus, diphtheria, and acellular pertussis (Td, Tdap) vaccine. Pregnant women should  receive 1 dose of Tdap vaccine during each pregnancy. The dose should be obtained regardless of the length of time since the last dose. Immunization is preferred during the 27th-36th week of gestation. An adult who has not previously received Tdap or who does not know her vaccine status should receive 1 dose of Tdap. This initial dose should be followed by tetanus and diphtheria toxoids (Td) booster doses every 10 years. Adults with an unknown or incomplete history of completing a 3-dose immunization series with Td-containing vaccines should begin or complete a primary immunization series including a Tdap dose. Adults should receive a Td booster every 10 years.  Varicella vaccine. An adult without evidence of immunity to varicella should receive 2 doses or a second dose if she has previously received 1 dose. Pregnant females who do not have evidence of immunity should receive the first dose after pregnancy. This first dose should be obtained before leaving the health care facility. The second dose should be obtained 4-8 weeks after the first dose.  Human papillomavirus (HPV) vaccine. Females aged 13-26 years who have not received the vaccine previously should obtain the 3-dose series. The vaccine is not recommended for use in pregnant females. However, pregnancy testing is not needed before receiving a dose. If a female is found to be pregnant after receiving a dose, no treatment is needed. In that case, the remaining doses should be delayed until after the pregnancy. Immunization is recommended for any person with an immunocompromised condition through the age of 26 years if she did not get any or all doses earlier. During the 3-dose series, the second dose should be obtained 4-8 weeks after the first dose. The third dose should be obtained 24 weeks after the first dose and 16 weeks after the second dose.  Zoster vaccine. One dose is recommended for adults aged 60 years or older unless certain conditions are  present.  Measles, mumps, and rubella (MMR) vaccine. Adults born before 1957 generally are considered immune to measles and mumps. Adults born in 1957 or later should have 1 or more doses of MMR vaccine unless there is a contraindication to the vaccine or there is laboratory evidence of immunity to   each of the three diseases. A routine second dose of MMR vaccine should be obtained at least 28 days after the first dose for students attending postsecondary schools, health care workers, or international travelers. People who received inactivated measles vaccine or an unknown type of measles vaccine during 1963-1967 should receive 2 doses of MMR vaccine. People who received inactivated mumps vaccine or an unknown type of mumps vaccine before 1979 and are at high risk for mumps infection should consider immunization with 2 doses of MMR vaccine. For females of childbearing age, rubella immunity should be determined. If there is no evidence of immunity, females who are not pregnant should be vaccinated. If there is no evidence of immunity, females who are pregnant should delay immunization until after pregnancy. Unvaccinated health care workers born before 1957 who lack laboratory evidence of measles, mumps, or rubella immunity or laboratory confirmation of disease should consider measles and mumps immunization with 2 doses of MMR vaccine or rubella immunization with 1 dose of MMR vaccine.  Pneumococcal 13-valent conjugate (PCV13) vaccine. When indicated, a person who is uncertain of her immunization history and has no record of immunization should receive the PCV13 vaccine. An adult aged 19 years or older who has certain medical conditions and has not been previously immunized should receive 1 dose of PCV13 vaccine. This PCV13 should be followed with a dose of pneumococcal polysaccharide (PPSV23) vaccine. The PPSV23 vaccine dose should be obtained at least 8 weeks after the dose of PCV13 vaccine. An adult aged 19  years or older who has certain medical conditions and previously received 1 or more doses of PPSV23 vaccine should receive 1 dose of PCV13. The PCV13 vaccine dose should be obtained 1 or more years after the last PPSV23 vaccine dose.  Pneumococcal polysaccharide (PPSV23) vaccine. When PCV13 is also indicated, PCV13 should be obtained first. All adults aged 65 years and older should be immunized. An adult younger than age 65 years who has certain medical conditions should be immunized. Any person who resides in a nursing home or long-term care facility should be immunized. An adult smoker should be immunized. People with an immunocompromised condition and certain other conditions should receive both PCV13 and PPSV23 vaccines. People with human immunodeficiency virus (HIV) infection should be immunized as soon as possible after diagnosis. Immunization during chemotherapy or radiation therapy should be avoided. Routine use of PPSV23 vaccine is not recommended for American Indians, [place] Natives, or people younger than 65 years unless there are medical conditions that require PPSV23 vaccine. When indicated, people who have unknown immunization and have no record of immunization should receive PPSV23 vaccine. One-time revaccination 5 years after the first dose of PPSV23 is recommended for people aged 19-64 years who have chronic kidney failure, nephrotic syndrome, asplenia, or immunocompromised conditions. People who received 1-2 doses of PPSV23 before age 65 years should receive another dose of PPSV23 vaccine at age 65 years or later if at least 5 years have passed since the previous dose. Doses of PPSV23 are not needed for people immunized with PPSV23 at or after age 65 years.  Meningococcal vaccine. Adults with asplenia or persistent complement component deficiencies should receive 2 doses of quadrivalent meningococcal conjugate (MenACWY-D) vaccine. The doses should be obtained at least 2 months apart.  Microbiologists working with certain meningococcal bacteria, military recruits, people at risk during an outbreak, and people who travel to or live in countries with a high rate of meningitis should be immunized. A first-year college student up through age   21 years who is living in a residence hall should receive a dose if she did not receive a dose on or after her 16th birthday. Adults who have certain high-risk conditions should receive one or more doses of vaccine.  Hepatitis A vaccine. Adults who wish to be protected from this disease, have certain high-risk conditions, work with hepatitis A-infected animals, work in hepatitis A research labs, or travel to or work in countries with a high rate of hepatitis A should be immunized. Adults who were previously unvaccinated and who anticipate close contact with an international adoptee during the first 60 days after arrival in the [place] from a country with a high rate of hepatitis A should be immunized.  Hepatitis B vaccine. Adults who wish to be protected from this disease, have certain high-risk conditions, may be exposed to blood or other infectious body fluids, are household contacts or sex partners of hepatitis B positive people, are clients or workers in certain care facilities, or travel to or work in countries with a high rate of hepatitis B should be immunized.  Haemophilus influenzae type b (Hib) vaccine. A previously unvaccinated person with asplenia or sickle cell disease or having a scheduled splenectomy should receive 1 dose of Hib vaccine. Regardless of previous immunization, a recipient of a hematopoietic stem cell transplant should receive a 3-dose series 6-12 months after her successful transplant. Hib vaccine is not recommended for adults with HIV infection. Preventive Services / Frequency Ages 64 to 68 years  Blood pressure check.** / Every 1 to 2 years.  Lipid and cholesterol check.** / Every 5 years beginning at age  22.  Clinical breast exam.** / Every 3 years for women in their 88s and 53s.  BRCA-related cancer risk assessment.** / For women who have family members with a BRCA-related cancer (breast, ovarian, tubal, or peritoneal cancers).  Pap test.** / Every 2 years from ages 90 through 51. Every 3 years starting at age 21 through age 56 or 3 with a history of 3 consecutive normal Pap tests.  HPV screening.** / Every 3 years from ages 24 through ages 1 to 46 with a history of 3 consecutive normal Pap tests.  Hepatitis C blood test.** / For any individual with known risks for hepatitis C.  Skin self-exam. / Monthly.  Influenza vaccine. / Every year.  Tetanus, diphtheria, and acellular pertussis (Tdap, Td) vaccine.** / Consult your health care provider. Pregnant women should receive 1 dose of Tdap vaccine during each pregnancy. 1 dose of Td every 10 years.  Varicella vaccine.** / Consult your health care provider. Pregnant females who do not have evidence of immunity should receive the first dose after pregnancy.  HPV vaccine. / 3 doses over 6 months, if 72 and younger. The vaccine is not recommended for use in pregnant females. However, pregnancy testing is not needed before receiving a dose.  Measles, mumps, rubella (MMR) vaccine.** / You need at least 1 dose of MMR if you were born in 1957 or later. You may also need a 2nd dose. For females of childbearing age, rubella immunity should be determined. If there is no evidence of immunity, females who are not pregnant should be vaccinated. If there is no evidence of immunity, females who are pregnant should delay immunization until after pregnancy.  Pneumococcal 13-valent conjugate (PCV13) vaccine.** / Consult your health care provider.  Pneumococcal polysaccharide (PPSV23) vaccine.** / 1 to 2 doses if you smoke cigarettes or if you have certain conditions.  Meningococcal vaccine.** /  1 dose if you are age 19 to 21 years and a first-year college  student living in a residence hall, or have one of several medical conditions, you need to get vaccinated against meningococcal disease. You may also need additional booster doses.  Hepatitis A vaccine.** / Consult your health care provider.  Hepatitis B vaccine.** / Consult your health care provider.  Haemophilus influenzae type b (Hib) vaccine.** / Consult your health care provider. Ages 40 to 64 years  Blood pressure check.** / Every 1 to 2 years.  Lipid and cholesterol check.** / Every 5 years beginning at age 20 years.  Lung cancer screening. / Every year if you are aged 55-80 years and have a 30-pack-year history of smoking and currently smoke or have quit within the past 15 years. Yearly screening is stopped once you have quit smoking for at least 15 years or develop a health problem that would prevent you from having lung cancer treatment.  Clinical breast exam.** / Every year after age 40 years.  BRCA-related cancer risk assessment.** / For women who have family members with a BRCA-related cancer (breast, ovarian, tubal, or peritoneal cancers).  Mammogram.** / Every year beginning at age 40 years and continuing for as long as you are in good health. Consult with your health care provider.  Pap test.** / Every 3 years starting at age 30 years through age 65 or 70 years with a history of 3 consecutive normal Pap tests.  HPV screening.** / Every 3 years from ages 30 years through ages 65 to 70 years with a history of 3 consecutive normal Pap tests.  Fecal occult blood test (FOBT) of stool. / Every year beginning at age 50 years and continuing until age 75 years. You may not need to do this test if you get a colonoscopy every 10 years.  Flexible sigmoidoscopy or colonoscopy.** / Every 5 years for a flexible sigmoidoscopy or every 10 years for a colonoscopy beginning at age 50 years and continuing until age 75 years.  Hepatitis C blood test.** / For all people born from 1945 through  1965 and any individual with known risks for hepatitis C.  Skin self-exam. / Monthly.  Influenza vaccine. / Every year.  Tetanus, diphtheria, and acellular pertussis (Tdap/Td) vaccine.** / Consult your health care provider. Pregnant women should receive 1 dose of Tdap vaccine during each pregnancy. 1 dose of Td every 10 years.  Varicella vaccine.** / Consult your health care provider. Pregnant females who do not have evidence of immunity should receive the first dose after pregnancy.  Zoster vaccine.** / 1 dose for adults aged 60 years or older.  Measles, mumps, rubella (MMR) vaccine.** / You need at least 1 dose of MMR if you were born in 1957 or later. You may also need a 2nd dose. For females of childbearing age, rubella immunity should be determined. If there is no evidence of immunity, females who are not pregnant should be vaccinated. If there is no evidence of immunity, females who are pregnant should delay immunization until after pregnancy.  Pneumococcal 13-valent conjugate (PCV13) vaccine.** / Consult your health care provider.  Pneumococcal polysaccharide (PPSV23) vaccine.** / 1 to 2 doses if you smoke cigarettes or if you have certain conditions.  Meningococcal vaccine.** / Consult your health care provider.  Hepatitis A vaccine.** / Consult your health care provider.  Hepatitis B vaccine.** / Consult your health care provider.  Haemophilus influenzae type b (Hib) vaccine.** / Consult your health care provider. Ages 65   years and over  Blood pressure check.** / Every 1 to 2 years.  Lipid and cholesterol check.** / Every 5 years beginning at age 22 years.  Lung cancer screening. / Every year if you are aged 73-80 years and have a 30-pack-year history of smoking and currently smoke or have quit within the past 15 years. Yearly screening is stopped once you have quit smoking for at least 15 years or develop a health problem that would prevent you from having lung cancer  treatment.  Clinical breast exam.** / Every year after age 4 years.  BRCA-related cancer risk assessment.** / For women who have family members with a BRCA-related cancer (breast, ovarian, tubal, or peritoneal cancers).  Mammogram.** / Every year beginning at age 40 years and continuing for as long as you are in good health. Consult with your health care provider.  Pap test.** / Every 3 years starting at age 9 years through age 34 or 91 years with 3 consecutive normal Pap tests. Testing can be stopped between 65 and 70 years with 3 consecutive normal Pap tests and no abnormal Pap or HPV tests in the past 10 years.  HPV screening.** / Every 3 years from ages 57 years through ages 64 or 45 years with a history of 3 consecutive normal Pap tests. Testing can be stopped between 65 and 70 years with 3 consecutive normal Pap tests and no abnormal Pap or HPV tests in the past 10 years.  Fecal occult blood test (FOBT) of stool. / Every year beginning at age 15 years and continuing until age 17 years. You may not need to do this test if you get a colonoscopy every 10 years.  Flexible sigmoidoscopy or colonoscopy.** / Every 5 years for a flexible sigmoidoscopy or every 10 years for a colonoscopy beginning at age 86 years and continuing until age 71 years.  Hepatitis C blood test.** / For all people born from [birth year] through 1963 and any individual with known risks for hepatitis C.  Osteoporosis screening.** / A one-time screening for women ages 83 years and over and women at risk for fractures or osteoporosis.  Skin self-exam. / Monthly.  Influenza vaccine. / Every year.  Tetanus, diphtheria, and acellular pertussis (Tdap/Td) vaccine.** / 1 dose of Td every 10 years.  Varicella vaccine.** / Consult your health care provider.  Zoster vaccine.** / 1 dose for adults aged 61 years or older.  Pneumococcal 13-valent conjugate (PCV13) vaccine.** / Consult your health care provider.  Pneumococcal  polysaccharide (PPSV23) vaccine.** / 1 dose for all adults aged 28 years and older.  Meningococcal vaccine.** / Consult your health care provider.  Hepatitis A vaccine.** / Consult your health care provider.  Hepatitis B vaccine.** / Consult your health care provider.  Haemophilus influenzae type b (Hib) vaccine.** / Consult your health care provider. ** Family history and personal history of risk and conditions may change your health care provider's recommendations. Document Released: 12/20/2001 Document Revised: 03/10/2014 Document Reviewed: 03/21/2011 [facility] Patient Information 2015 [place], [place]. This information is not intended to replace advice given to you by your health care provider. Make sure you discuss any questions you have with your health care provider.

## 2014-11-13 NOTE — Progress Notes (Signed)
Subjective:     Olivia Frederick is a 44 y.o. female and is here for a comprehensive physical exam. The patient reports no problems.  History   Social History  . Marital Status: Married    Spouse Name: N/A    Number of Children: N/A  . Years of Education: N/A   Occupational History  . Not on file.   Social History Main Topics  . Smoking status: Current Some Day Smoker  . Smokeless tobacco: Not on file  . Alcohol Use: Yes  . Drug Use: No  . Sexual Activity: Not on file   Other Topics Concern  . Not on file   Social History Narrative   Health Maintenance  Topic Date Due  . PAP SMEAR  09/18/1991  . TETANUS/TDAP  09/17/1992  . INFLUENZA VACCINE  07/30/2015 (Originally 06/07/2014)    The following portions of the patient's history were reviewed and updated as appropriate:  She  has a past medical history of Cervical dysplasia. She  does not have any pertinent problems on file. She  has past surgical history that includes Breast surgery. Her family history includes Cancer in her paternal aunt; Diabetes in her father; Hypertension in her mother. She  reports that she has been smoking.  She does not have any smokeless tobacco history on file. She reports that she drinks alcohol. She reports that she does not use illicit drugs. She currently has no medications in their medication list. No current outpatient prescriptions on file prior to visit.   No current facility-administered medications on file prior to visit.   She has No Known Allergies..  Review of Systems Review of Systems  Constitutional: Negative for activity change, appetite change and fatigue.  HENT: Negative for hearing loss, congestion, tinnitus and ear discharge.  dentist q28m Eyes: Negative for visual disturbance (see optho q1y -- vision corrected to 20/20 with glasses).  Respiratory: Negative for cough, chest tightness and shortness of breath.   Cardiovascular: Negative for chest pain, palpitations and  leg swelling.  Gastrointestinal: Negative for abdominal pain, diarrhea, constipation and abdominal distention.  Genitourinary: Negative for urgency, frequency, decreased urine volume and difficulty urinating.  Musculoskeletal: Negative for back pain, arthralgias and gait problem.  Skin: Negative for color change, pallor and rash.  Neurological: Negative for dizziness, light-headedness, numbness and headaches.  Hematological: Negative for adenopathy. Does not bruise/bleed easily.  Psychiatric/Behavioral: Negative for suicidal ideas, confusion, sleep disturbance, self-injury, dysphoric mood, decreased concentration and agitation.      Objective:    BP 120/78 mmHg  Pulse 63  Temp(Src) 98.2 F (36.8 C) (Oral)  Resp 16  Ht 5\' 6"  (1.676 m)  Wt 147 lb 3.2 oz (66.769 kg)  BMI 23.77 kg/m2  SpO2 97% General appearance: alert, cooperative, appears stated age and no distress Head: Normocephalic, without obvious abnormality, atraumatic Eyes: conjunctivae/corneas clear. PERRL, EOM's intact. Fundi benign. Ears: normal TM's and external ear canals both ears Nose: Nares normal. Septum midline. Mucosa normal. No drainage or sinus tenderness. Throat: lips, mucosa, and tongue normal; teeth and gums normal Neck: no adenopathy, no carotid bruit, no JVD, supple, symmetrical, trachea midline and thyroid not enlarged, symmetric, no tenderness/mass/nodules Back: symmetric, no curvature. ROM normal. No CVA tenderness. Lungs: clear to auscultation bilaterally Breasts: normal appearance, no masses or tenderness Heart: regular rate and rhythm, S1, S2 normal, no murmur, click, rub or gallop Abdomen: soft, non-tender; bowel sounds normal; no masses,  no organomegaly Pelvic: cervix normal in appearance, external genitalia normal, no adnexal  masses or tenderness, no cervical motion tenderness, rectovaginal septum normal, uterus normal size, shape, and consistency, uterus surgically absent, vagina normal without  discharge and pap done--heme neg brown stool Extremities: extremities normal, atraumatic, no cyanosis or edema Pulses: 2+ and symmetric Skin: Skin color, texture, turgor normal. No rashes or lesions Lymph nodes: Cervical, supraclavicular, and axillary nodes normal. Neurologic: Alert and oriented X 3, normal strength and tone. Normal symmetric reflexes. Normal coordination and gait Psych- no depression, no anxiety      Assessment:    Healthy female exam.       Plan:    .ghm utd Check labs See After Visit Summary for Counseling Recommendations

## 2014-11-13 NOTE — Progress Notes (Signed)
Pre visit review using our clinic review tool, if applicable. No additional management support is needed unless otherwise documented below in the visit note. 

## 2014-11-14 ENCOUNTER — Telehealth: Payer: Self-pay | Admitting: Family Medicine

## 2014-11-14 NOTE — Telephone Encounter (Signed)
emmi mailed  °

## 2014-11-17 LAB — CYTOLOGY - PAP

## 2015-01-16 ENCOUNTER — Ambulatory Visit
Admission: RE | Admit: 2015-01-16 | Discharge: 2015-01-16 | Disposition: A | Discharge status: Home / Self Care | Payer: 59 | Source: Ambulatory Visit | Attending: Family Medicine | Admitting: Family Medicine

## 2015-01-16 ENCOUNTER — Encounter (INDEPENDENT_AMBULATORY_CARE_PROVIDER_SITE_OTHER): Payer: Self-pay

## 2015-01-16 DIAGNOSIS — Z1239 Encounter for other screening for malignant neoplasm of breast: Secondary | ICD-10-CM

## 2015-11-16 ENCOUNTER — Encounter: Payer: 59 | Admitting: Family Medicine

## 2016-11-07 DIAGNOSIS — Z853 Personal history of malignant neoplasm of breast: Secondary | ICD-10-CM

## 2016-11-07 HISTORY — DX: Personal history of malignant neoplasm of breast: Z85.3

## 2017-01-25 ENCOUNTER — Other Ambulatory Visit: Payer: Self-pay | Admitting: Obstetrics and Gynecology

## 2017-01-25 DIAGNOSIS — N6315 Unspecified lump in the right breast, overlapping quadrants: Secondary | ICD-10-CM

## 2017-01-25 DIAGNOSIS — N6310 Unspecified lump in the right breast, unspecified quadrant: Principal | ICD-10-CM

## 2017-01-30 ENCOUNTER — Ambulatory Visit
Admission: RE | Admit: 2017-01-30 | Discharge: 2017-01-30 | Disposition: A | Discharge status: Home / Self Care | Payer: 59 | Source: Ambulatory Visit | Attending: Obstetrics and Gynecology | Admitting: Obstetrics and Gynecology

## 2017-01-30 ENCOUNTER — Other Ambulatory Visit: Payer: Self-pay | Admitting: Obstetrics and Gynecology

## 2017-01-30 DIAGNOSIS — N6310 Unspecified lump in the right breast, unspecified quadrant: Principal | ICD-10-CM

## 2017-01-30 DIAGNOSIS — R599 Enlarged lymph nodes, unspecified: Secondary | ICD-10-CM

## 2017-01-30 DIAGNOSIS — N6315 Unspecified lump in the right breast, overlapping quadrants: Secondary | ICD-10-CM

## 2017-01-31 ENCOUNTER — Ambulatory Visit
Admission: RE | Admit: 2017-01-31 | Discharge: 2017-01-31 | Disposition: A | Discharge status: Home / Self Care | Payer: 59 | Source: Ambulatory Visit | Attending: Obstetrics and Gynecology | Admitting: Obstetrics and Gynecology

## 2017-01-31 ENCOUNTER — Other Ambulatory Visit: Payer: Self-pay | Admitting: Obstetrics and Gynecology

## 2017-01-31 DIAGNOSIS — N6310 Unspecified lump in the right breast, unspecified quadrant: Principal | ICD-10-CM

## 2017-01-31 DIAGNOSIS — N6315 Unspecified lump in the right breast, overlapping quadrants: Secondary | ICD-10-CM

## 2017-01-31 DIAGNOSIS — R599 Enlarged lymph nodes, unspecified: Secondary | ICD-10-CM

## 2017-02-02 ENCOUNTER — Other Ambulatory Visit: Payer: Self-pay | Admitting: *Deleted

## 2017-02-02 ENCOUNTER — Telehealth: Payer: Self-pay | Admitting: *Deleted

## 2017-02-02 DIAGNOSIS — Z170 Estrogen receptor positive status [ER+]: Principal | ICD-10-CM

## 2017-02-02 DIAGNOSIS — C50411 Malignant neoplasm of upper-outer quadrant of right female breast: Secondary | ICD-10-CM | POA: Insufficient documentation

## 2017-02-02 NOTE — Telephone Encounter (Signed)
Confirmed [facility] for 02/08/17 at 12:15pm .  Instructions and contact information given.

## 2017-02-07 ENCOUNTER — Other Ambulatory Visit: Payer: Self-pay | Admitting: Obstetrics & Gynecology

## 2017-02-07 DIAGNOSIS — D0511 Intraductal carcinoma in situ of right breast: Secondary | ICD-10-CM

## 2017-02-08 ENCOUNTER — Ambulatory Visit: Payer: Self-pay | Admitting: General Surgery

## 2017-02-08 ENCOUNTER — Encounter: Payer: Self-pay | Admitting: Radiation Oncology

## 2017-02-08 ENCOUNTER — Ambulatory Visit (HOSPITAL_BASED_OUTPATIENT_CLINIC_OR_DEPARTMENT_OTHER): Payer: 59 | Admitting: Oncology

## 2017-02-08 ENCOUNTER — Ambulatory Visit
Admission: RE | Admit: 2017-02-08 | Discharge: 2017-02-08 | Disposition: A | Discharge status: Home / Self Care | Payer: 59 | Source: Ambulatory Visit | Attending: Radiation Oncology | Admitting: Radiation Oncology

## 2017-02-08 ENCOUNTER — Other Ambulatory Visit (HOSPITAL_BASED_OUTPATIENT_CLINIC_OR_DEPARTMENT_OTHER): Payer: 59

## 2017-02-08 VITALS — BP 146/65 | HR 89 | Temp 98.2°F | Resp 18 | Ht 66.0 in | Wt 148.3 lb

## 2017-02-08 DIAGNOSIS — Z170 Estrogen receptor positive status [ER+]: Principal | ICD-10-CM

## 2017-02-08 DIAGNOSIS — C50411 Malignant neoplasm of upper-outer quadrant of right female breast: Secondary | ICD-10-CM | POA: Diagnosis not present

## 2017-02-08 LAB — CBC WITH DIFFERENTIAL/PLATELET
BASO%: 0.5 % (ref 0.0–2.0)
Basophils Absolute: 0 10*3/uL (ref 0.0–0.1)
EOS%: 1 % (ref 0.0–7.0)
Eosinophils Absolute: 0.1 10*3/uL (ref 0.0–0.5)
HEMATOCRIT: 34.4 % — AB (ref 34.8–46.6)
HEMOGLOBIN: 11.9 g/dL (ref 11.6–15.9)
LYMPH#: 1.4 10*3/uL (ref 0.9–3.3)
LYMPH%: 17.1 % (ref 14.0–49.7)
MCH: 26.4 pg (ref 25.1–34.0)
MCHC: 34.8 g/dL (ref 31.5–36.0)
MCV: 76 fL — ABNORMAL LOW (ref 79.5–101.0)
MONO#: 0.8 10*3/uL (ref 0.1–0.9)
MONO%: 9.8 % (ref 0.0–14.0)
NEUT#: 5.7 10*3/uL (ref 1.5–6.5)
NEUT%: 71.6 % (ref 38.4–76.8)
Platelets: 300 10*3/uL (ref 145–400)
RBC: 4.52 10*6/uL (ref 3.70–5.45)
RDW: 14.5 % (ref 11.2–14.5)
WBC: 8 10*3/uL (ref 3.9–10.3)

## 2017-02-08 LAB — COMPREHENSIVE METABOLIC PANEL
ALT: 9 U/L (ref 0–55)
ANION GAP: 11 meq/L (ref 3–11)
AST: 13 U/L (ref 5–34)
Albumin: 4.4 g/dL (ref 3.5–5.0)
Alkaline Phosphatase: 49 U/L (ref 40–150)
BUN: 6.6 mg/dL — ABNORMAL LOW (ref 7.0–26.0)
CALCIUM: 10 mg/dL (ref 8.4–10.4)
CHLORIDE: 104 meq/L (ref 98–109)
CO2: 23 mEq/L (ref 22–29)
Creatinine: 0.9 mg/dL (ref 0.6–1.1)
EGFR: >90 mL/min/{1.73_m2} (ref >90)
Glucose: 121 mg/dl (ref 70–140)
Potassium: 4 mEq/L (ref 3.5–5.1)
SODIUM: 138 meq/L (ref 136–145)
Total Bilirubin: 0.69 mg/dL (ref 0.20–1.20)
Total Protein: 8.1 g/dL (ref 6.4–8.3)

## 2017-02-08 NOTE — Progress Notes (Addendum)
Radiation Oncology         [phone] ________________________________  Name: Olivia Frederick MRN: [id number]  Date: 02/08/2017  DOB: 1971-09-08      REFERRING PHYSICIAN: Excell Seltzer, MD   DIAGNOSIS: The encounter diagnosis was Malignant neoplasm of upper-outer quadrant of right breast in female, estrogen receptor positive ([place]).   HISTORY OF PRESENT ILLNESS: Olivia Frederick is a 46 y.o. female seen in the multidisciplinary breast clinic for a new diagnosis of right breast cancer. Of note she's had previous breast surgery to remove a benign fibrous tumor of the right breast at the age of 36. She had dense breast tissue and two years ago was called back for repeat imaging. She had a normal mammogram as well last year. She recently  identified a mass in the right breast about 3-4 months ago and proceeded with diagnostic mammogram on 01/30/17 which revealed a  Mass deep to the areola. An ultrasound confirmed a 4.6 x 3.4 x 2.7 cm mass in the subareolar region, and her axillary assessment revealed a 4 mm node with cortical thickening. 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 Her lymph node biopsy was negative for malignancy. She comes today to discuss options for treatment of her cancer.    PREVIOUS RADIATION THERAPY: No   PAST MEDICAL HISTORY:  Past Medical History:  Diagnosis Date  . Cervical dysplasia        PAST SURGICAL HISTORY: Past Surgical History:  Procedure Laterality Date  . BREAST EXCISIONAL BIOPSY Right   . BREAST SURGERY     right breast benign tumor      FAMILY HISTORY:  Family History  Problem Relation Age of Onset  . Hypertension Mother   . Diabetes Father   . Leukemia Paternal Grandfather   . Breast cancer Cousin 79  . Cancer Paternal Uncle     details unknown  . Cancer Maternal Uncle     details unknown     SOCIAL HISTORY:  reports that  she has been smoking.  She does not have any smokeless tobacco history on file. She reports that she drinks alcohol. She reports that she does not use drugs. The patient is married and lives in [place]. She works as an [employer] fast [employer].    ALLERGIES: Patient has no known allergies.   MEDICATIONS:  Current Outpatient Prescriptions  Medication Sig Dispense Refill  . acetaminophen (TYLENOL) 325 MG tablet Take 650 mg by mouth as needed.    . diphenhydrAMINE (BENADRYL) 25 mg capsule Take 25 mg by mouth as needed.     No current facility-administered medications for this encounter.      REVIEW OF SYSTEMS: On review of systems, the patient reports that she is doing well overall. She denies any chest pain, shortness of breath, cough, fevers, chills, night sweats, unintended weight changes. She denies any bowel or bladder disturbances, and denies abdominal pain, nausea or vomiting. She denies any new musculoskeletal or joint aches or pains. A complete review of systems is obtained and is otherwise negative.     PHYSICAL EXAM:  Wt Readings from Last 3 Encounters:  02/08/17 148 lb 4.8 oz (67.3 kg)  11/13/14 147 lb 3.2 oz (66.8 kg)  07/29/14 147 lb 7.8 oz (66.9 kg)   Temp Readings from Last 3 Encounters:  02/08/17 98.2 F (36.8 C) (Oral)  11/13/14 98.2 F (36.8 C) (Oral)  07/29/14 98 F (  36.7 C) (Oral)   BP Readings from Last 3 Encounters:  02/08/17 (!) 146/65  11/13/14 120/78  07/29/14 129/76   Pulse Readings from Last 3 Encounters:  02/08/17 89  11/13/14 63  07/29/14 63       In general this is a well appearing African American female in no acute distress. She is alert and oriented x4 and appropriate throughout the examination. HEENT reveals that the patient is normocephalic, atraumatic. EOMs are intact. PERRLA. Skin is intact without any evidence of gross lesions. Cardiovascular exam reveals a regular rate and rhythm, no clicks rubs or murmurs are auscultated. Chest is clear to  auscultation bilaterally. Lymphatic assessment is performed and does not reveal any adenopathy in the cervical, supraclavicular, axillary, or inguinal chains. Bilateral breasts are examined and reveal no nipple bleeding or discharge is noted of either breast. The left breast is negative for palpable mass. The right breast reveals fullness of the breast deep to the areola and the mass measures about 5 cm in greatest dimension. Abdomen has active bowel sounds in all quadrants and is intact. The abdomen is soft, non tender, non distended. Lower extremities are negative for pretibial pitting edema, deep calf tenderness, cyanosis or clubbing.   ECOG = 0  0 - Asymptomatic (Fully active, able to carry on all predisease activities without restriction)  1 - Symptomatic but completely ambulatory (Restricted in physically strenuous activity but ambulatory and able to carry out work of a light or sedentary nature. For example, light housework, office work)  2 - Symptomatic, <50% in bed during the day (Ambulatory and capable of all self care but unable to carry out any work activities. Up and about more than 50% of waking hours)  3 - Symptomatic, >50% in bed, but not bedbound (Capable of only limited self-care, confined to bed or chair 50% or more of waking hours)  4 - Bedbound (Completely disabled. Cannot carry on any self-care. Totally confined to bed or chair)  5 - Death   Eustace Pen MM, Creech RH, Tormey DC, et al. [phone]). "Toxicity and response criteria of the [facility]". [place] Oncol. 5 (6): 649-55    LABORATORY DATA:  Lab Results  Component Value Date   WBC 8.0 02/08/2017   HGB 11.9 02/08/2017   HCT 34.4 (L) 02/08/2017   MCV 76.0 (L) 02/08/2017   PLT 300 02/08/2017   Lab Results  Component Value Date   NA 138 02/08/2017   K 4.0 02/08/2017   CO2 23 02/08/2017   Lab Results  Component Value Date   ALT 9 02/08/2017   AST 13 02/08/2017   ALKPHOS 49 02/08/2017    BILITOT 0.69 02/08/2017      RADIOGRAPHY: US Breast Ltd Uni Right Inc Axilla  Result Date: 01/30/2017 CLINICAL DATA:  46 year old female with palpable abnormality in the right breast at 12 o'clock for 3-4 months. EXAM: 2D DIGITAL DIAGNOSTIC BILATERAL MAMMOGRAM WITH CAD AND ADJUNCT TOMO ULTRASOUND RIGHT BREAST COMPARISON:  Previous exam(s). ACR Breast Density Category c: The breast tissue is heterogeneously dense, which may obscure small masses. FINDINGS: There is an irregular mass within the anterior, superior right breast underlying the palpable marker which spans approximately 5 cm. No suspicious mass, calcifications, or other abnormality is identified within the left breast. Mammographic images were processed with CAD. On physical exam, there is a firm palpable mass at 12 o'clock, 2 cm from the nipple, corresponding to the patient's area of concern. Targeted ultrasound is performed demonstrating an  irregular mixed echogenicity area with posterior acoustic shadowing centered at 12 o'clock, 2 cm from the nipple spanning at least 4.6 x 3.4 x 2.7 cm. There is a similar-appearing adjacent area at 1 o'clock, 2 cm from the nipple measuring 1.3 x 1 x 1 cm which is thought to be contiguous with the area at 12 o'clock. Targeted ultrasound of the right axilla demonstrates a right axillary lymph node with cortical thickening measuring up to 4 mm. IMPRESSION: 1. Suspicious right breast mass centered at 12 o'clock, 2 cm from the nipple. 2. Indeterminate right axillary lymph node. RECOMMENDATION: 1. Ultrasound-guided biopsy of the right breast mass at 12 o'clock, 2 cm from the nipple. If biopsy results demonstrate malignancy, breast MRI is recommended for further evaluation of extent of disease. 2. Ultrasound-guided biopsy of the indeterminate right axillary lymph node. I have discussed the findings and recommendations with the patient. Results were also provided in writing at the conclusion of the visit. If applicable, a  reminder letter will be sent to the patient regarding the next appointment. BI-RADS CATEGORY  5: Highly suggestive of malignancy. Electronically Signed   By: Pamelia Hoit M.D.   On: 01/30/2017 17:07   Mm Diag Breast Tomo Bilateral  Result Date: 01/30/2017 CLINICAL DATA:  45 year old female with palpable abnormality in the right breast at 12 o'clock for 3-4 months. EXAM: 2D DIGITAL DIAGNOSTIC BILATERAL MAMMOGRAM WITH CAD AND ADJUNCT TOMO ULTRASOUND RIGHT BREAST COMPARISON:  Previous exam(s). ACR Breast Density Category c: The breast tissue is heterogeneously dense, which may obscure small masses. FINDINGS: There is an irregular mass within the anterior, superior right breast underlying the palpable marker which spans approximately 5 cm. No suspicious mass, calcifications, or other abnormality is identified within the left breast. Mammographic images were processed with CAD. On physical exam, there is a firm palpable mass at 12 o'clock, 2 cm from the nipple, corresponding to the patient's area of concern. Targeted ultrasound is performed demonstrating an irregular mixed echogenicity area with posterior acoustic shadowing centered at 12 o'clock, 2 cm from the nipple spanning at least 4.6 x 3.4 x 2.7 cm. There is a similar-appearing adjacent area at 1 o'clock, 2 cm from the nipple measuring 1.3 x 1 x 1 cm which is thought to be contiguous with the area at 12 o'clock. Targeted ultrasound of the right axilla demonstrates a right axillary lymph node with cortical thickening measuring up to 4 mm. IMPRESSION: 1. Suspicious right breast mass centered at 12 o'clock, 2 cm from the nipple. 2. Indeterminate right axillary lymph node. RECOMMENDATION: 1. Ultrasound-guided biopsy of the right breast mass at 12 o'clock, 2 cm from the nipple. If biopsy results demonstrate malignancy, breast MRI is recommended for further evaluation of extent of disease. 2. Ultrasound-guided biopsy of the indeterminate right axillary lymph node. I  have discussed the findings and recommendations with the patient. Results were also provided in writing at the conclusion of the visit. If applicable, a reminder letter will be sent to the patient regarding the next appointment. BI-RADS CATEGORY  5: Highly suggestive of malignancy. Electronically Signed   By: Pamelia Hoit M.D.   On: 01/30/2017 17:07   [place] Axillary Node Core Biopsy Right  Addendum Date: 02/03/2017   ADDENDUM REPORT: 02/02/2017 15:30 ADDENDUM: Pathology revealed GRADE II INVASIVE DUCTAL CARCINOMA, DUCTAL CARCINOMA IN SITU of the Right breast, 12:00. REACTIVE LYMPH NODE of the Right axilla. This was found to be concordant by Dr. Curlene Dolphin. Pathology results were discussed with the patient by telephone.  The patient reported doing well after the biopsies with tenderness at the sites. Post biopsy instructions and care were reviewed and questions were answered. The patient was encouraged to call [facility] for any additional concerns. The patient was referred to [facility] on February 08, 2017. Recommendation for bilateral breast MRI for further evaluation of extent of disease. Pathology results reported by Terie Purser, RN on 02/01/2017. Electronically Signed   By: Curlene Dolphin M.D.   On: 02/02/2017 15:30   Result Date: 02/03/2017 CLINICAL DATA:  Right axillary lymph node with mild cortical thickening was seen at the time of evaluation of a suspicious palpable mass in the right breast. Axillary lymph node biopsy was recommended. EXAM: ULTRASOUND GUIDED CORE NEEDLE BIOPSY OF A RIGHT AXILLARY NODE COMPARISON:  Previous exam(s). FINDINGS: I met with the patient and we discussed the procedure of ultrasound-guided biopsy, including benefits and alternatives. We discussed the high likelihood of a successful procedure. We discussed the risks of the procedure, including infection, bleeding, tissue injury,  clip migration, and inadequate sampling. Informed written consent was given. The usual time-out protocol was performed immediately prior to the procedure. Using sterile technique and 1% Lidocaine as local anesthetic, under direct ultrasound visualization, a 14 gauge spring-loaded device was used to perform biopsy of a lymph node with cortical thickening in the right axilla using a lateral to medial approach. At the conclusion of the procedure a spiral HydroMARK tissue marker clip was deployed into the biopsy cavity. Follow up 2 view mammogram was performed and dictated separately. IMPRESSION: Ultrasound guided biopsy of right axillary lymph node. No apparent complications. Electronically Signed: By: Curlene Dolphin M.D. On: 01/31/2017 16:51   Mm Clip Placement Right  Result Date: 01/31/2017 CLINICAL DATA:  Suspicious mass in the right breast 12 o'clock position was biopsied under ultrasound guidance. Additionally, a single right axillary lymph node with mild cortical thickening was biopsied. EXAM: DIAGNOSTIC RIGHT MAMMOGRAM POST ULTRASOUND BIOPSY COMPARISON:  Previous exam(s). FINDINGS: Mammographic images were obtained following ultrasound guided biopsy of a right breast mass at 12 o'clock position and of a right axillary lymph node. A ribbon shaped biopsy clip is satisfactorily positioned within the palpable mass at 12 o'clock position in the right breast. The biopsied right axillary lymph node is not included in the imaging field. IMPRESSION: Satisfactory position of ribbon shaped biopsy clip in the right breast mass. Final Assessment: Post Procedure Mammograms for Marker Placement Electronically Signed   By: Curlene Dolphin M.D.   On: 01/31/2017 16:56   [place] Rt Breast Bx W Loc Dev 1st Lesion Img Bx Spec US Guide  Addendum Date: 02/03/2017   ADDENDUM REPORT: 02/02/2017 15:31 ADDENDUM: Pathology revealed GRADE II INVASIVE DUCTAL CARCINOMA, DUCTAL CARCINOMA IN SITU of the Right breast, 12:00. REACTIVE LYMPH NODE  of the Right axilla. This was found to be concordant by Dr. Curlene Dolphin. Pathology results were discussed with the patient by telephone. The patient reported doing well after the biopsies with tenderness at the sites. Post biopsy instructions and care were reviewed and questions were answered. The patient was encouraged to call [facility] for any additional concerns. The patient was referred to [facility] on February 08, 2017. Recommendation for bilateral breast MRI for further evaluation of extent of disease. Pathology results reported by Terie Purser, RN on 02/01/2017. Electronically Signed  By: Curlene Dolphin M.D.   On: 02/02/2017 15:31   Result Date: 02/03/2017 CLINICAL DATA:  Ultrasound-guided biopsy was recommended of a suspicious palpable mass spanning the 12 to 1 o'clock position of the right breast. EXAM: ULTRASOUND GUIDED RIGHT BREAST CORE NEEDLE BIOPSY COMPARISON:  Previous exam(s). FINDINGS: I met with the patient and we discussed the procedure of ultrasound-guided biopsy, including benefits and alternatives. We discussed the high likelihood of a successful procedure. We discussed the risks of the procedure, including infection, bleeding, tissue injury, clip migration, and inadequate sampling. Informed written consent was given. The usual time-out protocol was performed immediately prior to the procedure. Using sterile technique and 1% Lidocaine as local anesthetic, under direct ultrasound visualization, a 12 gauge spring-loaded device was used to perform biopsy of a suspicious palpable mass in the 12-1 o'clock position of the right breast using a lateral to medial approach. At the conclusion of the procedure a ribbon shaped tissue marker clip was deployed into the biopsy cavity. Follow up 2 view mammogram was performed and dictated separately. IMPRESSION: Ultrasound guided biopsy of right breast mass.  No apparent complications. Electronically Signed: By: Curlene Dolphin M.D. On: 01/31/2017 16:49       IMPRESSION/PLAN: 1. Stage IB, cT2N0Mx triple positive, grade 2 invasive ductal carcinoma of the right breast. Dr. Sondra Come discusses the pathology findings and reviews the nature of invasive ductal breast disease. We reviewed the discussion from conference regarding her case and due to the size of her tumor, and receptor status for HER2, Dr. Jana Hakim would recommend neoadjuvant chemotherapy. He has recommended an MRI of the breasts to outline the characteristics of her disease, and following chemotherapy, her post treatment imaging would guide surgical options. Dr. Sondra Come reviews that role for radiotherapy following surgery as well as adjuvant antiestrogen therapy. We discussed the risks, benefits, short, and long term effects of radiotherapy, and the patient is interested in proceeding. Dr. Sondra Come discusses the delivery and logistics of radiotherapy. We will see her back about 2 weeks after surgery to move forward with the simulation and planning process and anticipate starting radiotherapy about 4 weeks after surgery.    The above documentation reflects my direct findings during this shared patient visit. Please see the separate note by Dr. Sondra Come on this date for the remainder of the patient's plan of care.    Carola Rhine, PAC Please see the note from Shona Simpson, PA-C from today's visit for more details of today's encounter.  I have personally performed a face to face diagnostic evaluation on this patient and devised the following assessment and plan.   Gery Pray, MD

## 2017-02-08 NOTE — Progress Notes (Signed)
START ON PATHWAY REGIMEN - Breast   Docetaxel  + Carboplatin + Trastuzumab + Pertuzumab (TCHP) q21 Days:   A cycle is every 21 days:     Pertuzumab      Pertuzumab      Trastuzumab      Trastuzumab      Carboplatin      Docetaxel   **Always confirm dose/schedule in your pharmacy ordering system**    Trastuzumab (Maintenance - NO Loading Dose):   A cycle is every 21 days:     Trastuzumab   **Always confirm dose/schedule in your pharmacy ordering system**    Patient Characteristics: Preoperative or Nonsurgical Candidate (Clinical Staging), Neoadjuvant Therapy followed by Surgery, Invasive Disease, Chemotherapy, HER2 Positive, ER Positive Therapeutic Status: Preoperative or Nonsurgical Candidate (Clinical Staging) AJCC M Category: cM0 Breast Surgical Plan: Neoadjuvant Therapy followed by Surgery AJCC 8 Stage Grouping: IB ER Status: Positive (+) HER2 Status: Positive (+) AJCC T Category: T2 AJCC Grade: G2 AJCC N Category: cN0 PR Status: Positive (+)  Intent of Therapy: Curative Intent, Discussed with Patient

## 2017-02-08 NOTE — Progress Notes (Signed)
Olivia Frederick  [phone] [phone]     ID: Olivia Frederick DOB: [birth year]  MR#: [id number]  YIA#:[id number]  Patient Care Team: Olivia Held, DO as PCP - General (Family Medicine) Olivia Seltzer, MD as Consulting Physician (General Surgery) Olivia Cruel, MD as Consulting Physician (Oncology) Olivia Pray, MD as Consulting Physician (Radiation Oncology) Olivia Cruel, MD OTHER MD:  CHIEF COMPLAINT: Triple positive breast cancer  CURRENT TREATMENT: Neoadjuvant chemotherapy   BREAST CANCER HISTORY: Olivia Frederick noted a change in her right breast around November 2017. She was not very concerned about it but did mention it at the time of mammography which had been scheduled for 01/30/2017. Accordingly she had bilateral diagnostic mammography with tomography and right breast ultrasonography that day at the [facility]. In the right breast there was an irregular mass measuring approximately 5 cm. On exam this was firm and palpable at the 12:00 location 2 cm from the nipple targeted ultrasonography showed an area of mixed echogenicity superiorly measuring at least 4.6 cm with a similar appearing adjacent area measuring 1.3 cm thought to be contiguous ultrasound of the right axilla showed one ymph node with cortical thickening.  Biopsy of the right breast mass 01/31/2017 showed ([id number]) invasive ductal carcinoma, grade 2, estrogen receptor 100% positive, with strong staining intensity, progesterone receptor 25% positive, with moderate staining intensity, with an MIB-1 of 25%, and HER-2 amplified with a signals ratio of 2.35, the number per cell being 4.0. Biopsy of the suspicious axillary lymph node showed only reactive  Her subsequent history is as detailed below  INTERVAL HISTORY: Olivia Frederick was evaluated in the multidisciplinary breast cancer clinic 02/08/2017 accompanied by her husband Olivia Frederick. Her case was also presented in the  multidisciplinary breast cancer conference that same morning. At that time a preliminary plan was proposed: Neoadjuvant chemotherapy and anti-HER-2 immunotherapy, implying the need for a baseline MRI; genetics testing, with definitive surgical plan depending on those results; adjuvant radiation is appropriate and then anti-estrogens  REVIEW OF SYSTEMS: Aside from the mass itself, there where  There were no specific symptoms leading to the original mammogram, which was routinely scheduled. The patient denies unusual headaches, visual changes, nausea, vomiting, stiff neck, dizziness, or gait imbalance. There has been no cough, phlegm production, or pleurisy, no chest pain or pressure, and no change in bowel or bladder habits. The patient denies fever, rash, bleeding, unexplained fatigue or unexplained weight loss. The patient and exercises by walking but not on a regular basis. A detailed review of systems was otherwise entirely negative.       PAST MEDICAL HISTORY: Past Medical History:  Diagnosis Date  . Cervical dysplasia     PAST SURGICAL HISTORY: Past Surgical History:  Procedure Laterality Date  . BREAST EXCISIONAL BIOPSY Right   . BREAST SURGERY     right breast benign tumor     FAMILY HISTORY Family History  Problem Relation Age of Onset  . Hypertension Mother   . Diabetes Father   . Cancer Paternal Aunt   The patient's parents are living, both in their early 53s as of April 2018. She has one brother and one sister. On the paternal side a grandfather was diagnosed with leukemia at age 57, an uncle also with leukemia at age 21, another uncle at age 54 with colon cancer, and a cousin with breast cancer at age 88. On the mother's side there was an uncle with cancer of some type, not known to the patient,  diagnosed age 64   GYNECOLOGIC HISTORY:  No LMP recorded.  Menarche age 74. She is GX P2, first live birth age 76. As of April 2018 she is having regular periods. She never used  oral contraceptives.    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 Son Olivia Frederick is 29, daughter Olivia Frederick is 84, as of April 2018.     ADVANCED DIRECTIVES: Not in place    HEALTH MAINTENANCE: Social History  Substance Use Topics  . Smoking status: Current Some Day Smoker  . Smokeless tobacco: Not on file  . Alcohol use Yes     Colonoscopy: n/a  PAP:  Bone density:   No Known Allergies  Current Outpatient Prescriptions  Medication Sig Dispense Refill  . acetaminophen (TYLENOL) 325 MG tablet Take 650 mg by mouth as needed.    . diphenhydrAMINE (BENADRYL) 25 mg capsule Take 25 mg by mouth as needed.     No current facility-administered medications for this visit.     OBJECTIVE: [name] who appears well  Vitals:   02/08/17 1306  BP: (!) 146/65  Pulse: 89  Resp: 18  Temp: 98.2 F (36.8 C)     Body mass index is 23.94 kg/m.    ECOG FS:0 - Asymptomatic  Ocular: Sclerae unicteric, pupils round and equal  Ear-nose-throat: Oropharynx clear and moist Lymphatic: No cervical or supraclavicular adenopathy Lungs no rales or rhonchi, good excursion bilaterally Heart regular rate and rhythm, no murmur appreciated Abd soft, nontender, positive bowel sounds MSK no focal spinal tenderness, no joint edema Neuro: non-focal, well-oriented, appropriate affect Breasts: The right breast is status post recent biopsy. There is an easily palpable mass in the superior aspect of the breast. This is movable. There is no nipple retraction or other skin changes of concern. Left breast is unremarkable. Both axillae are benign.    LAB RESULTS:  CMP     Component Value Date/Time   NA 138 02/08/2017 1257   K 4.0 02/08/2017 1257   CO2 23 02/08/2017 1257   GLUCOSE 121 02/08/2017 1257   BUN 6.6 (L) 02/08/2017 1257   CREATININE 0.9 02/08/2017 1257   CALCIUM 10.0 02/08/2017 1257   PROT 8.1 02/08/2017 1257   ALBUMIN 4.4 02/08/2017 1257   AST 13  02/08/2017 1257   ALT 9 02/08/2017 1257   ALKPHOS 49 02/08/2017 1257   BILITOT 0.69 02/08/2017 1257    No results found for: TOTALPROTELP, ALBUMINELP, A1GS, A2GS, BETS, BETA2SER, GAMS, MSPIKE, SPEI  No results found for: KPAFRELGTCHN, LAMBDASER, KAPLAMBRATIO  Lab Results  Component Value Date   WBC 8.0 02/08/2017   NEUTROABS 5.7 02/08/2017   HGB 11.9 02/08/2017   HCT 34.4 (L) 02/08/2017   MCV 76.0 (L) 02/08/2017   PLT 300 02/08/2017      Chemistry      Component Value Date/Time   NA 138 02/08/2017 1257   K 4.0 02/08/2017 1257   CO2 23 02/08/2017 1257   BUN 6.6 (L) 02/08/2017 1257   CREATININE 0.9 02/08/2017 1257      Component Value Date/Time   CALCIUM 10.0 02/08/2017 1257   ALKPHOS 49 02/08/2017 1257   AST 13 02/08/2017 1257   ALT 9 02/08/2017 1257   BILITOT 0.69 02/08/2017 1257       No results found for: LABCA2  No components found for: [id number]  No results for input(s): INR in the last 168 hours.  Urinalysis    Component Value Date/Time  COLORURINE yellow 01/22/2010 1457   APPEARANCEUR Clear 01/22/2010 1457   LABSPEC 1.015 01/22/2010 1457   PHURINE 5.0 01/22/2010 1457   GLUCOSEU NEGATIVE 05/24/2007 1404   HGBUR trace-lysed 01/22/2010 1457   BILIRUBINUR negative 01/22/2010 [place] 05/24/2007 1404   PROTEINUR NEGATIVE 05/24/2007 1404   UROBILINOGEN 0.2 01/22/2010 1457   NITRITE negative 01/22/2010 1457   LEUKOCYTESUR  05/24/2007 1404    NEGATIVE MICROSCOPIC NOT DONE ON URINES WITH NEGATIVE PROTEIN, BLOOD, LEUKOCYTES, NITRITE, OR GLUCOSE <1000 mg/dL.     STUDIES: US Breast Ltd Uni Right Inc Axilla  Result Date: 01/30/2017 CLINICAL DATA:  46 year old female with palpable abnormality in the right breast at 12 o'clock for 3-4 months. EXAM: 2D DIGITAL DIAGNOSTIC BILATERAL MAMMOGRAM WITH CAD AND ADJUNCT TOMO ULTRASOUND RIGHT BREAST COMPARISON:  Previous exam(s). ACR Breast Density Category c: The breast tissue is heterogeneously  dense, which may obscure small masses. FINDINGS: There is an irregular mass within the anterior, superior right breast underlying the palpable marker which spans approximately 5 cm. No suspicious mass, calcifications, or other abnormality is identified within the left breast. Mammographic images were processed with CAD. On physical exam, there is a firm palpable mass at 12 o'clock, 2 cm from the nipple, corresponding to the patient's area of concern. Targeted ultrasound is performed demonstrating an irregular mixed echogenicity area with posterior acoustic shadowing centered at 12 o'clock, 2 cm from the nipple spanning at least 4.6 x 3.4 x 2.7 cm. There is a similar-appearing adjacent area at 1 o'clock, 2 cm from the nipple measuring 1.3 x 1 x 1 cm which is thought to be contiguous with the area at 12 o'clock. Targeted ultrasound of the right axilla demonstrates a right axillary lymph node with cortical thickening measuring up to 4 mm. IMPRESSION: 1. Suspicious right breast mass centered at 12 o'clock, 2 cm from the nipple. 2. Indeterminate right axillary lymph node. RECOMMENDATION: 1. Ultrasound-guided biopsy of the right breast mass at 12 o'clock, 2 cm from the nipple. If biopsy results demonstrate malignancy, breast MRI is recommended for further evaluation of extent of disease. 2. Ultrasound-guided biopsy of the indeterminate right axillary lymph node. I have discussed the findings and recommendations with the patient. Results were also provided in writing at the conclusion of the visit. If applicable, a reminder letter will be sent to the patient regarding the next appointment. BI-RADS CATEGORY  5: Highly suggestive of malignancy. Electronically Signed   By: Pamelia Hoit M.D.   On: 01/30/2017 17:07   Mm Diag Breast Tomo Bilateral  Result Date: 01/30/2017 CLINICAL DATA:  46 year old female with palpable abnormality in the right breast at 12 o'clock for 3-4 months. EXAM: 2D DIGITAL DIAGNOSTIC BILATERAL  MAMMOGRAM WITH CAD AND ADJUNCT TOMO ULTRASOUND RIGHT BREAST COMPARISON:  Previous exam(s). ACR Breast Density Category c: The breast tissue is heterogeneously dense, which may obscure small masses. FINDINGS: There is an irregular mass within the anterior, superior right breast underlying the palpable marker which spans approximately 5 cm. No suspicious mass, calcifications, or other abnormality is identified within the left breast. Mammographic images were processed with CAD. On physical exam, there is a firm palpable mass at 12 o'clock, 2 cm from the nipple, corresponding to the patient's area of concern. Targeted ultrasound is performed demonstrating an irregular mixed echogenicity area with posterior acoustic shadowing centered at 12 o'clock, 2 cm from the nipple spanning at least 4.6 x 3.4 x 2.7 cm. There is a similar-appearing adjacent area at 1 o'clock, 2  cm from the nipple measuring 1.3 x 1 x 1 cm which is thought to be contiguous with the area at 12 o'clock. Targeted ultrasound of the right axilla demonstrates a right axillary lymph node with cortical thickening measuring up to 4 mm. IMPRESSION: 1. Suspicious right breast mass centered at 12 o'clock, 2 cm from the nipple. 2. Indeterminate right axillary lymph node. RECOMMENDATION: 1. Ultrasound-guided biopsy of the right breast mass at 12 o'clock, 2 cm from the nipple. If biopsy results demonstrate malignancy, breast MRI is recommended for further evaluation of extent of disease. 2. Ultrasound-guided biopsy of the indeterminate right axillary lymph node. I have discussed the findings and recommendations with the patient. Results were also provided in writing at the conclusion of the visit. If applicable, a reminder letter will be sent to the patient regarding the next appointment. BI-RADS CATEGORY  5: Highly suggestive of malignancy. Electronically Signed   By: Pamelia Hoit M.D.   On: 01/30/2017 17:07   [place] Axillary Node Core Biopsy Right  Addendum Date:  02/03/2017   ADDENDUM REPORT: 02/02/2017 15:30 ADDENDUM: Pathology revealed GRADE II INVASIVE DUCTAL CARCINOMA, DUCTAL CARCINOMA IN SITU of the Right breast, 12:00. REACTIVE LYMPH NODE of the Right axilla. This was found to be concordant by Dr. Curlene Dolphin. Pathology results were discussed with the patient by telephone. The patient reported doing well after the biopsies with tenderness at the sites. Post biopsy instructions and care were reviewed and questions were answered. The patient was encouraged to call [facility] for any additional concerns. The patient was referred to [facility] on February 08, 2017. Recommendation for bilateral breast MRI for further evaluation of extent of disease. Pathology results reported by Terie Purser, RN on 02/01/2017. Electronically Signed   By: Curlene Dolphin M.D.   On: 02/02/2017 15:30   Result Date: 02/03/2017 CLINICAL DATA:  Right axillary lymph node with mild cortical thickening was seen at the time of evaluation of a suspicious palpable mass in the right breast. Axillary lymph node biopsy was recommended. EXAM: ULTRASOUND GUIDED CORE NEEDLE BIOPSY OF A RIGHT AXILLARY NODE COMPARISON:  Previous exam(s). FINDINGS: I met with the patient and we discussed the procedure of ultrasound-guided biopsy, including benefits and alternatives. We discussed the high likelihood of a successful procedure. We discussed the risks of the procedure, including infection, bleeding, tissue injury, clip migration, and inadequate sampling. Informed written consent was given. The usual time-out protocol was performed immediately prior to the procedure. Using sterile technique and 1% Lidocaine as local anesthetic, under direct ultrasound visualization, a 14 gauge spring-loaded device was used to perform biopsy of a lymph node with cortical thickening in the right axilla using a lateral to medial  approach. At the conclusion of the procedure a spiral HydroMARK tissue marker clip was deployed into the biopsy Olivia Frederick. Follow up 2 view mammogram was performed and dictated separately. IMPRESSION: Ultrasound guided biopsy of right axillary lymph node. No apparent complications. Electronically Signed: By: Curlene Dolphin M.D. On: 01/31/2017 16:51   Mm Clip Placement Right  Result Date: 01/31/2017 CLINICAL DATA:  Suspicious mass in the right breast 12 o'clock position was biopsied under ultrasound guidance. Additionally, a single right axillary lymph node with mild cortical thickening was biopsied. EXAM: DIAGNOSTIC RIGHT MAMMOGRAM POST ULTRASOUND BIOPSY COMPARISON:  Previous exam(s). FINDINGS: Mammographic images were obtained following ultrasound guided biopsy of a right breast mass at 12 o'clock position and of a  right axillary lymph node. A ribbon shaped biopsy clip is satisfactorily positioned within the palpable mass at 12 o'clock position in the right breast. The biopsied right axillary lymph node is not included in the imaging field. IMPRESSION: Satisfactory position of ribbon shaped biopsy clip in the right breast mass. Final Assessment: Post Procedure Mammograms for Marker Placement Electronically Signed   By: Curlene Dolphin M.D.   On: 01/31/2017 16:56   [place] Rt Breast Bx W Loc Dev 1st Lesion Img Bx Spec US Guide  Addendum Date: 02/03/2017   ADDENDUM REPORT: 02/02/2017 15:31 ADDENDUM: Pathology revealed GRADE II INVASIVE DUCTAL CARCINOMA, DUCTAL CARCINOMA IN SITU of the Right breast, 12:00. REACTIVE LYMPH NODE of the Right axilla. This was found to be concordant by Dr. Curlene Dolphin. Pathology results were discussed with the patient by telephone. The patient reported doing well after the biopsies with tenderness at the sites. Post biopsy instructions and care were reviewed and questions were answered. The patient was encouraged to call [facility] for any additional concerns.  The patient was referred to [facility] on February 08, 2017. Recommendation for bilateral breast MRI for further evaluation of extent of disease. Pathology results reported by Terie Purser, RN on 02/01/2017. Electronically Signed   By: Curlene Dolphin M.D.   On: 02/02/2017 15:31   Result Date: 02/03/2017 CLINICAL DATA:  Ultrasound-guided biopsy was recommended of a suspicious palpable mass spanning the 12 to 1 o'clock position of the right breast. EXAM: ULTRASOUND GUIDED RIGHT BREAST CORE NEEDLE BIOPSY COMPARISON:  Previous exam(s). FINDINGS: I met with the patient and we discussed the procedure of ultrasound-guided biopsy, including benefits and alternatives. We discussed the high likelihood of a successful procedure. We discussed the risks of the procedure, including infection, bleeding, tissue injury, clip migration, and inadequate sampling. Informed written consent was given. The usual time-out protocol was performed immediately prior to the procedure. Using sterile technique and 1% Lidocaine as local anesthetic, under direct ultrasound visualization, a 12 gauge spring-loaded device was used to perform biopsy of a suspicious palpable mass in the 12-1 o'clock position of the right breast using a lateral to medial approach. At the conclusion of the procedure a ribbon shaped tissue marker clip was deployed into the biopsy Olivia Frederick. Follow up 2 view mammogram was performed and dictated separately. IMPRESSION: Ultrasound guided biopsy of right breast mass. No apparent complications. Electronically Signed: By: Curlene Dolphin M.D. On: 01/31/2017 16:49    ELIGIBLE FOR AVAILABLE RESEARCH PROTOCOL:   ASSESSMENT: 45 y.o.  Olivia Frederick woman status post right breast biopsy 02/01/2017 for a cT2 pN0, stage IB invasive ductal carcinoma, grade 2, estrogen and progesterone receptor positive, with an MIB-1 of 25%, and HER-2 amplified   (a) biopsy of a  suspicious right axillary lymph node 01/24/2017 showed only reactive lymphoid tissue  (1) genetics testing pending  (2) neoadjuvant chemotherapy will consist of carboplatin, docetaxel, and trastuzumab with pertuzumab given every 3 weeks 6, starting 02/16/2017   (3) anti-HER-2 immunotherapy to continue to total 1 year  (4) definitive surgery to follow at the completion of chemotherapy  (5) adjuvant radiation as appropriate  (6) anti-estrogens to follow the completion of local treatment   PLAN: We spent the better part of today's hour-long appointment discussing the biology of breast cancer in general, and the specifics of the patient's tumor in particular. We first reviewed the fact that cancer is not one disease but more than  100 different diseases and that it is important to keep them separate-- otherwise when friends and relatives discuss their own cancer experiences with [facility] confusion can result. Similarly we explained that if breast cancer spreads to the bone or liver, the patient would not have bone cancer or liver cancer, but breast cancer in the bone and breast cancer in the liver: one cancer in three places-- not 3 different cancers which otherwise would have to be treated in 3 different ways.  We discussed the difference between local and systemic therapy. In terms of loco-regional treatment, lumpectomy plus radiation is equivalent to mastectomy as far as survival is concerned. For this reason, and because the cosmetic results are generally superior, we recommend breast conserving surgery. We also noted that in terms of sequencing of treatments, whether systemic therapy or surgery is done first does not affect the ultimate outcome.  We then discussed the rationale for systemic therapy. There is some risk that this cancer may have already spread to other parts of her body. Patients frequently ask at this point about bone scans, CAT scans and PET scans to find out if they have occult  breast cancer somewhere else. The problem is that in early stage disease we are much more likely to find false positives then true cancers and this would expose the patient to unnecessary procedures as well as unnecessary radiation. Scans cannot answer the question the patient really would like to know, which is whether she has microscopic disease elsewhere in her body. For those reasons we do not recommend them.  Of course we would proceed to aggressive evaluation of any symptoms that might suggest metastatic disease, but that is not the case here.  Next we went over the options for systemic therapy which are anti-estrogens, anti-HER-2 immunotherapy, and chemotherapy. Elayne meets criteria for all 3. This is very favorable as it means whatever her baseline risk of recurrence after optimal local treatment is, we standard systemic therapy that risk will drop to 1/6 of baseline  She qualifies for genetics testing and because the results of that test will affect her surgical decision, and it may be several weeks before results are in, we feel starting with chemotherapy in her case would be optimal. We discussed the possible toxicities, side effects and complications of carboplatin and docetaxel, to be given every 3 weeks 6. She understands she may become permanently menopausal after these treatments. This is not a concern to her. We also discussed the possibility of weakening the heart muscle with anti-HER-2 immunotherapy. She will be scheduled for an echocardiogram prior to the start of treatment  She will receive anti-HER-2 treatment for total of a year and once she completes local therapy she will start anti-estrogen therapy for a minimum of 7 years.  Efrata has a good understanding of the overall plan. She agrees with it. She knows the goal of treatment in her case is cure. She will call with any problems that may develop before her next visit here.  Olivia Cruel, MD   02/08/2017 1:45 PM Medical  Oncology and Hematology [facility] [address] [place], [place] [address] Tel. [phone]    Fax. [phone]

## 2017-02-08 NOTE — Progress Notes (Signed)
Nutrition Assessment  Reason for Assessment:  Pt seen in Breast Clinic  ASSESSMENT:   46 year old female with new diagnosis of right breast cancer. Past medical history reviewed.  Patient reports normal appetite  Medications:  reviewed  Labs: reviewed  Anthropometrics:   Height: 66 inches Weight: 148 lb BMI: 24   NUTRITION DIAGNOSIS: Food and nutrition related knowledge deficit related to new diagnosis of breast cancer as evidenced by no prior need for nutrition related information.  INTERVENTION:   Discussed and provided packet of information regarding nutritional tips for breast cancer patients.  Questions answered.  Teachback method used.  Contact information provided and patient knows to contact me with questions/concerns.    MONITORING, EVALUATION, and GOAL: Pt will consume a healthy plant based diet to maintain lean body mass throughout treatment.   Deng Kemler B. Zenia Resides, [place], [place] Registered Dietitian [phone] (pager)

## 2017-02-09 ENCOUNTER — Telehealth: Payer: Self-pay | Admitting: *Deleted

## 2017-02-09 ENCOUNTER — Encounter: Payer: Self-pay | Admitting: General Practice

## 2017-02-09 ENCOUNTER — Encounter: Payer: Self-pay | Admitting: Oncology

## 2017-02-09 NOTE — Progress Notes (Signed)
[facility] Psychosocial Distress Screening Spiritual Care  Met with Lydiah and husband Thad in [facility] to introduce [facility] team/resources, reviewing distress screen per protocol.  The patient scored a 3 on the Psychosocial Distress Thermometer which indicates mild distress. Also assessed for distress and other psychosocial needs.   ONCBCN DISTRESS SCREENING 02/09/2017  Screening Type Initial Screening  Distress experienced in past week (1-10) 3  Practical problem type Insurance;Work/school  Information Concerns Type Lack of info about diagnosis;Lack of info about treatment  Referral to financial advocate Yes  Referral to support programs Yes   Meekah presented as more distressed than a 3; tears were near the surface, and, per pt, she is very anxious about getting port and managing chemo/xrt/surgical recovery in the midst of a very busy schedule. 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 Although daytime events pose a challenge, she is very interested in healing arts programs. She engaged readily in conversation and verbalized value of emotional support. Normalized feelings. Encouraged focus on health/self-care at this time and setting boundaries with other commitments during this season.   Follow up needed: No. Referring to [facility] and financial advocate per pt request. Encouraged pt/family to reach out anytime for further support. They have full packet of team/programming info. Will also mail handwritten f/u note today because pt was so receptive to and appreciative of emotional support.   [place], [place], [facility] Pager [phone] Voicemail [phone]

## 2017-02-09 NOTE — Telephone Encounter (Signed)
Left vm for pt to return call to discuss [facility] and questions from 02/08/17.  Contact information provided.

## 2017-02-09 NOTE — Progress Notes (Signed)
Received referral from Lisa(chaplain) of patient with financial concerns and insurance questions. Attempted to reach patient on number listed as home number and there was no answer and kept ringing with no voicemail that picked up. Called patient on number listed as cell number and no answer. Left a message on voicemail for her to return call with my name and number to schedule an appointment.

## 2017-02-10 ENCOUNTER — Telehealth: Payer: Self-pay | Admitting: *Deleted

## 2017-02-10 ENCOUNTER — Encounter: Payer: Self-pay | Admitting: *Deleted

## 2017-02-10 NOTE — Telephone Encounter (Signed)
  Oncology Nurse Navigator Documentation  Navigator Location: [facility] (02/10/17 0800)   )Navigator Encounter Type: Telephone (02/10/17 0800) Telephone: Incoming Call;Appt Confirmation/Clarification (02/10/17 0800)    Schedule and confirmed chemo class on 4/10 at 0930. Denies further questions or needs at this time.                   Barriers/Navigation Needs: Coordination of Care (02/10/17 0800)   Interventions: Coordination of Care (02/10/17 0800)   Coordination of Care: Appts (02/10/17 0800)                  Time Spent with Patient: 15 (02/10/17 0800)

## 2017-02-13 ENCOUNTER — Encounter (HOSPITAL_COMMUNITY): Payer: Self-pay

## 2017-02-13 ENCOUNTER — Ambulatory Visit (HOSPITAL_COMMUNITY)
Admission: RE | Admit: 2017-02-13 | Discharge: 2017-02-13 | Disposition: A | Discharge status: Home / Self Care | Payer: 59 | Source: Ambulatory Visit | Attending: Oncology | Admitting: Oncology

## 2017-02-13 ENCOUNTER — Ambulatory Visit
Admission: RE | Admit: 2017-02-13 | Discharge: 2017-02-13 | Disposition: A | Discharge status: Home / Self Care | Payer: 59 | Source: Ambulatory Visit | Attending: Obstetrics & Gynecology | Admitting: Obstetrics & Gynecology

## 2017-02-13 ENCOUNTER — Ambulatory Visit (HOSPITAL_BASED_OUTPATIENT_CLINIC_OR_DEPARTMENT_OTHER)
Admission: RE | Admit: 2017-02-13 | Discharge: 2017-02-13 | Disposition: A | Discharge status: Home / Self Care | Payer: 59 | Source: Ambulatory Visit | Attending: Cardiology | Admitting: Cardiology

## 2017-02-13 VITALS — BP 125/68 | HR 79 | Wt 147.5 lb

## 2017-02-13 DIAGNOSIS — C50411 Malignant neoplasm of upper-outer quadrant of right female breast: Secondary | ICD-10-CM | POA: Insufficient documentation

## 2017-02-13 DIAGNOSIS — Z01810 Encounter for preprocedural cardiovascular examination: Secondary | ICD-10-CM | POA: Diagnosis present

## 2017-02-13 DIAGNOSIS — Z170 Estrogen receptor positive status [ER+]: Secondary | ICD-10-CM | POA: Insufficient documentation

## 2017-02-13 DIAGNOSIS — D0511 Intraductal carcinoma in situ of right breast: Secondary | ICD-10-CM

## 2017-02-13 MED ORDER — GADOBENATE DIMEGLUMINE 529 MG/ML IV SOLN
15.0000 mL | Freq: Once | INTRAVENOUS | Status: AC | PRN
  Administered 2017-02-13: 15 mL via INTRAVENOUS

## 2017-02-13 NOTE — Progress Notes (Signed)
Oncology: Dr. Jana Hakim  46 yo with no significant past history presents for cardio-oncology evaluation s/p recent diagnosis with triple positive breast cancer.  Breast cancer was diagnosed 3/18, ER+/PR+/HER2+.  She will get chemotherapy starting on 02/16/17 with carboplatin, docetaxel, trastuzumab, pertuzumab x 6 cycles.  She will then continue Herceptin to complete 1 year.  She will have definitive surgery and radiation after chemo.   She has no prior cardiac disease. Uncle with CABG in his 22s, otherwise no heart disease in her family.  She is a prior smoker.  No exertional dyspnea or chest pain, good exercise tolerance.   PMH: 1.  Breast cancer: Diagnosed 3/18, ER+/PR+/HER2+.  She will get chemotherapy starting on 02/16/17 with carboplatin, docetaxel, trastuzumab, pertuzumab x 6 cycles.  She will then continue Herceptin to complete 1 year.  She will have definitive surgery and radiation after chemo.  - Echo (3/18): EF 83-15%, normal diastolic function, GLS -17.6%.   Social History   Social History  . Marital status: Married    Spouse name: N/A  . Number of children: N/A  . Years of education: N/A   Occupational History  . Not on file.   Social History Main Topics  . Smoking status: Former Smoker    Quit date: 02/01/2017  . Smokeless tobacco: Never Used  . Alcohol use Yes  . Drug use: No  . Sexual activity: Not on file   Other Topics Concern  . Not on file   Social History Narrative  . No narrative on file   Family History  Problem Relation Age of Onset  . Hypertension Mother   . Diabetes Father   . Leukemia Paternal Grandfather   . Breast cancer Cousin 68  . Cancer Paternal Uncle     details unknown  . Cancer Maternal Uncle     details unknown   ROS: All systems reviewed and negative except as per HPI.   Current Outpatient Prescriptions  Medication Sig Dispense Refill  . acetaminophen (TYLENOL) 325 MG tablet Take 650 mg by mouth as needed.    . diphenhydrAMINE  (BENADRYL) 25 mg capsule Take 25 mg by mouth as needed.     No current facility-administered medications for this encounter.    BP 125/68   Pulse 79   Wt 147 lb 8 oz (66.9 kg)   SpO2 100%   BMI 23.81 kg/m  General: NAD Neck: No JVD, no thyromegaly or thyroid nodule.  Lungs: Clear to auscultation bilaterally with normal respiratory effort. CV: Nondisplaced PMI.  Heart regular S1/S2, no S3/S4, no murmur.  No peripheral edema.  No carotid bruit.  Normal pedal pulses.  Abdomen: Soft, nontender, no hepatosplenomegaly, no distention.  Skin: Intact without lesions or rashes.  Neurologic: Alert and oriented x 3.  Psych: Normal affect. Extremities: No clubbing or cyanosis.  HEENT: Normal.   Assessment/Plan: 46 yo with triple positive breast cancer presents for cardio-oncology evaluation.  She will be getting Herceptin for a total of 1 year, starting later this week.  I reviewed her echo today.  Baseline echo appears normal.  We discussed the cardiac risk from Herceptin and the rationale behind echo screening.   - She will return in 3 months with a repeat echocardiogram.   [name] 02/13/2017

## 2017-02-13 NOTE — Progress Notes (Signed)
  Echocardiogram 2D Echocardiogram has been performed.  Olivia Frederick 02/13/2017, 10:49 AM

## 2017-02-13 NOTE — Patient Instructions (Signed)
We will contact you in 3 months to schedule your next appointment and echocardiogram  

## 2017-02-14 ENCOUNTER — Encounter: Payer: Self-pay | Admitting: Adult Health

## 2017-02-14 ENCOUNTER — Encounter: Payer: Self-pay | Admitting: *Deleted

## 2017-02-14 ENCOUNTER — Ambulatory Visit (HOSPITAL_BASED_OUTPATIENT_CLINIC_OR_DEPARTMENT_OTHER): Payer: 59 | Admitting: Adult Health

## 2017-02-14 ENCOUNTER — Other Ambulatory Visit: Payer: 59

## 2017-02-14 VITALS — BP 118/66 | HR 90 | Temp 98.1°F | Resp 18 | Ht 66.0 in | Wt 146.6 lb

## 2017-02-14 DIAGNOSIS — C50411 Malignant neoplasm of upper-outer quadrant of right female breast: Secondary | ICD-10-CM

## 2017-02-14 DIAGNOSIS — Z170 Estrogen receptor positive status [ER+]: Secondary | ICD-10-CM | POA: Diagnosis not present

## 2017-02-14 MED ORDER — LORAZEPAM 0.5 MG PO TABS: 0.5000 mg | ORAL_TABLET | Freq: Four times a day (QID) | ORAL | 0 refills | Status: DC | PRN

## 2017-02-14 MED ORDER — LIDOCAINE-PRILOCAINE 2.5-2.5 % EX CREA: TOPICAL_CREAM | CUTANEOUS | 3 refills | Status: DC

## 2017-02-14 MED ORDER — DEXAMETHASONE 4 MG PO TABS: 8.0000 mg | ORAL_TABLET | Freq: Two times a day (BID) | ORAL | 1 refills | Status: DC

## 2017-02-14 MED ORDER — PROCHLORPERAZINE MALEATE 10 MG PO TABS: 10.0000 mg | ORAL_TABLET | Freq: Four times a day (QID) | ORAL | 1 refills | Status: DC | PRN

## 2017-02-14 NOTE — Progress Notes (Signed)
[place]  [phone] [phone]     ID: Olivia Frederick DOB: Nov 30, 1970  MR#: [id number]  ION#:[id number]  Patient Care Team: Ann Held, DO as PCP - General (Family Medicine) Excell Seltzer, MD as Consulting Physician (General Surgery) Chauncey Cruel, MD as Consulting Physician (Oncology) Gery Pray, MD as Consulting Physician (Radiation Oncology) Scot Dock, NP OTHER MD:  CHIEF COMPLAINT: Triple positive breast cancer  CURRENT TREATMENT: Neoadjuvant chemotherapy   BREAST CANCER HISTORY: Olivia Frederick noted a change in her right breast around November 2017. She was not very concerned about it but did mention it at the time of mammography which had been scheduled for 01/30/2017. Accordingly she had bilateral diagnostic mammography with tomography and right breast ultrasonography that day at the [facility]. In the right breast there was an irregular mass measuring approximately 5 cm. On exam this was firm and palpable at the 12:00 location 2 cm from the nipple targeted ultrasonography showed an area of mixed echogenicity superiorly measuring at least 4.6 cm with a similar appearing adjacent area measuring 1.3 cm thought to be contiguous ultrasound of the right axilla showed one ymph node with cortical thickening.  Biopsy of the right breast mass 01/31/2017 showed ([id number]) invasive ductal carcinoma, grade 2, estrogen receptor 100% positive, with strong staining intensity, progesterone receptor 25% positive, with moderate staining intensity, with an MIB-1 of 25%, and HER-2 amplified with a signals ratio of 2.35, the number per cell being 4.0. Biopsy of the suspicious axillary lymph node showed only reactive  Her subsequent history is as detailed below  INTERVAL HISTORY: Olivia Frederick is here today for evaluation prior to beginning her first cycle of Docetaxel, Carboplatin, Trastuzumab and Pertuzumab.  She is doing well today.  She  does have questions about the chemotherapy regimen, scheduling of such, common side effects, and anti-nausea medications.  She had appointment with Dr. Aundra Dubin in our cardio oncology clinic yesterday and he reviewed her echocardiogram and cleared her to start treatment.    REVIEW OF SYSTEMS: Olivia Frederick denies fevers, chills, mouth sores, nausea, vomiting, constipation, diarrhea, neuropathy, chest pain, palpitations, or any further concerns.  A detailed ROS was non contributory.        PAST MEDICAL HISTORY: Past Medical History:  Diagnosis Date  . Cervical dysplasia     PAST SURGICAL HISTORY: Past Surgical History:  Procedure Laterality Date  . BREAST EXCISIONAL BIOPSY Right   . BREAST SURGERY     right breast benign tumor     FAMILY HISTORY Family History  Problem Relation Age of Onset  . Hypertension Mother   . Diabetes Father   . Leukemia Paternal Grandfather   . Breast cancer Cousin 51  . Cancer Paternal Uncle     details unknown  . Cancer Maternal Uncle     details unknown  The patient's parents are living, both in their early 26s as of April 2018. She has one brother and one sister. On the paternal side a grandfather was diagnosed with leukemia at age 86, an uncle also with leukemia at age 55, another uncle at age 36 with colon cancer, and a cousin with breast cancer at age 22. On the mother's side there was an uncle with cancer of some type, not known to the patient, diagnosed age 29   GYNECOLOGIC HISTORY:  No LMP recorded.  Menarche age 89. She is GX P2, first live birth age 82. As of April 2018 she is having regular periods. She never  used oral contraceptives.    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 Son Olivia Frederick is 12, daughter cavity is 52, as of April 2018.     ADVANCED DIRECTIVES: Not in place    HEALTH MAINTENANCE: Social History  Substance Use Topics  . Smoking status: Former Smoker    Quit date: 02/01/2017  . Smokeless tobacco:  Never Used  . Alcohol use Yes     Colonoscopy: n/a  PAP:  Bone density:   No Known Allergies  Current Outpatient Prescriptions  Medication Sig Dispense Refill  . acetaminophen (TYLENOL) 325 MG tablet Take 650 mg by mouth as needed.    Marland Kitchen dexamethasone (DECADRON) 4 MG tablet Take 2 tablets (8 mg total) by mouth 2 (two) times daily. Start the day before Taxotere. Then again the day after chemo for 3 days. 30 tablet 1  . diphenhydrAMINE (BENADRYL) 25 mg capsule Take 25 mg by mouth as needed.    . lidocaine-prilocaine (EMLA) cream Apply to affected area once 30 g 3  . LORazepam (ATIVAN) 0.5 MG tablet Take 1 tablet (0.5 mg total) by mouth every 6 (six) hours as needed (Nausea or vomiting). 30 tablet 0  . prochlorperazine (COMPAZINE) 10 MG tablet Take 1 tablet (10 mg total) by mouth every 6 (six) hours as needed (Nausea or vomiting). 30 tablet 1   No current facility-administered medications for this visit.     OBJECTIVE: [name] who appears well  Vitals:   02/14/17 0843  BP: 118/66  Pulse: 90  Resp: 18  Temp: 98.1 F (36.7 C)     Body mass index is 23.66 kg/m.    ECOG FS:0 - Asymptomatic GENERAL: Patient is a well appearing female in no acute distress HEENT:  Sclerae anicteric.  Oropharynx clear and moist. No ulcerations or evidence of oropharyngeal candidiasis. Neck is supple.  NODES:  No cervical, supraclavicular, or axillary lymphadenopathy palpated.  BREAST EXAM:  Right nodule under nipple about 2-3 cm, ecchymosis to left of nipple noted LUNGS:  Clear to auscultation bilaterally.  No wheezes or rhonchi. HEART:  Regular rate and rhythm. No murmur appreciated. ABDOMEN:  Soft, nontender.  Positive, normoactive bowel sounds. No organomegaly palpated. MSK:  No focal spinal tenderness to palpation. Full range of motion bilaterally in the upper extremities. EXTREMITIES:  No peripheral edema.   SKIN:  Clear with no obvious rashes or skin changes. No nail  dyscrasia. NEURO:  Nonfocal. Well oriented.  Appropriate affect.    LAB RESULTS:  CMP     Component Value Date/Time   NA 138 02/08/2017 1257   K 4.0 02/08/2017 1257   CO2 23 02/08/2017 1257   GLUCOSE 121 02/08/2017 1257   BUN 6.6 (L) 02/08/2017 1257   CREATININE 0.9 02/08/2017 1257   CALCIUM 10.0 02/08/2017 1257   PROT 8.1 02/08/2017 1257   ALBUMIN 4.4 02/08/2017 1257   AST 13 02/08/2017 1257   ALT 9 02/08/2017 1257   ALKPHOS 49 02/08/2017 1257   BILITOT 0.69 02/08/2017 1257    No results found for: TOTALPROTELP, ALBUMINELP, A1GS, A2GS, BETS, BETA2SER, GAMS, MSPIKE, SPEI  No results found for: KPAFRELGTCHN, LAMBDASER, KAPLAMBRATIO  Lab Results  Component Value Date   WBC 8.0 02/08/2017   NEUTROABS 5.7 02/08/2017   HGB 11.9 02/08/2017   HCT 34.4 (L) 02/08/2017   MCV 76.0 (L) 02/08/2017   PLT 300 02/08/2017      Chemistry      Component Value Date/Time  NA 138 02/08/2017 1257   K 4.0 02/08/2017 1257   CO2 23 02/08/2017 1257   BUN 6.6 (L) 02/08/2017 1257   CREATININE 0.9 02/08/2017 1257      Component Value Date/Time   CALCIUM 10.0 02/08/2017 1257   ALKPHOS 49 02/08/2017 1257   AST 13 02/08/2017 1257   ALT 9 02/08/2017 1257   BILITOT 0.69 02/08/2017 1257       No results found for: LABCA2  No components found for: [id number]  No results for input(s): INR in the last 168 hours.  Urinalysis    Component Value Date/Time   COLORURINE yellow 01/22/2010 1457   APPEARANCEUR Clear 01/22/2010 1457   LABSPEC 1.015 01/22/2010 1457   PHURINE 5.0 01/22/2010 1457   GLUCOSEU NEGATIVE 05/24/2007 1404   HGBUR trace-lysed 01/22/2010 1457   BILIRUBINUR negative 01/22/2010 [place] 05/24/2007 1404   PROTEINUR NEGATIVE 05/24/2007 1404   UROBILINOGEN 0.2 01/22/2010 1457   NITRITE negative 01/22/2010 1457   LEUKOCYTESUR  05/24/2007 1404    NEGATIVE MICROSCOPIC NOT DONE ON URINES WITH NEGATIVE PROTEIN, BLOOD, LEUKOCYTES, NITRITE, OR GLUCOSE <1000  mg/dL.     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 LABS:  None performed today EXAM: BILATERAL BREAST MRI WITH AND WITHOUT CONTRAST TECHNIQUE: Multiplanar, multisequence MR images of both breasts were obtained prior to and following the intravenous administration of 15 ml of MultiHance. THREE-DIMENSIONAL MR IMAGE RENDERING ON INDEPENDENT WORKSTATION: Three-dimensional MR images were rendered by post-processing of the original MR data on an independent workstation. The three-dimensional MR images were interpreted, and findings are reported in the following complete MRI report for this study. Three dimensional images were evaluated at the independent DynaCad workstation COMPARISON:  Prior mammograms and ultrasound FINDINGS: Breast composition: c. Heterogeneous fibroglandular tissue. Background parenchymal enhancement: Moderate. Right breast: 3 adjacent irregular masses within the upper and central right breast are identified as follows: A 1.5 x 1.6 x 1.5 cm upper retroareolar right breast mass A 3.1 x 1.6 x 1.3 cm mass in the upper right breast (anterior third) with biopsy clip artifact A 1.8 x 1.8 x 1.6 cm mass in the inner central right breast These 3 masses encompasses an area measuring 5.1 x 1.8 x 3.1 cm. A slightly irregular 1.2 x 0.9 x 1.2 cm enhancing mass within the lower outer right breast (at junction of the middle and posterior thirds) is noted. This mass lies 3 cm posterior and inferior to the 1.8 cm central right breast mass described above. Left breast: No mass or abnormal enhancement. Lymph nodes: No abnormal appearing lymph nodes. Ancillary findings:  None. IMPRESSION: Three adjacent irregular masses within the upper/ central right breast encompassing an area measuring 5.1 x 1.8 x 3.1 cm. The upper right breast mass (anterior third) has biopsy clip artifact and  represents biopsy-proven malignancy. Suspicious 1.2 cm mass within the lower outer right breast. Recommend second-look right breast ultrasound and biopsy if breast conservation is desired. No MR evidence of left breast malignancy or abnormal lymph nodes. RECOMMENDATION: Second-look right breast ultrasound and biopsy of 1.2 cm lower outer right breast mass if breast conservation is desired. Treatment plan otherwise. BI-RADS CATEGORY  6: Known biopsy-proven malignancy. Electronically Signed   By: Margarette Canada M.D.   On: 02/14/2017 10:44   US Breast Ltd Uni Right Inc Axilla  Result Date: 01/30/2017 CLINICAL DATA:  46 year old female with  palpable abnormality in the right breast at 12 o'clock for 3-4 months. EXAM: 2D DIGITAL DIAGNOSTIC BILATERAL MAMMOGRAM WITH CAD AND ADJUNCT TOMO ULTRASOUND RIGHT BREAST COMPARISON:  Previous exam(s). ACR Breast Density Category c: The breast tissue is heterogeneously dense, which may obscure small masses. FINDINGS: There is an irregular mass within the anterior, superior right breast underlying the palpable marker which spans approximately 5 cm. No suspicious mass, calcifications, or other abnormality is identified within the left breast. Mammographic images were processed with CAD. On physical exam, there is a firm palpable mass at 12 o'clock, 2 cm from the nipple, corresponding to the patient's area of concern. Targeted ultrasound is performed demonstrating an irregular mixed echogenicity area with posterior acoustic shadowing centered at 12 o'clock, 2 cm from the nipple spanning at least 4.6 x 3.4 x 2.7 cm. There is a similar-appearing adjacent area at 1 o'clock, 2 cm from the nipple measuring 1.3 x 1 x 1 cm which is thought to be contiguous with the area at 12 o'clock. Targeted ultrasound of the right axilla demonstrates a right axillary lymph node with cortical thickening measuring up to 4 mm. IMPRESSION: 1. Suspicious right breast mass centered at 12 o'clock, 2 cm from the  nipple. 2. Indeterminate right axillary lymph node. RECOMMENDATION: 1. Ultrasound-guided biopsy of the right breast mass at 12 o'clock, 2 cm from the nipple. If biopsy results demonstrate malignancy, breast MRI is recommended for further evaluation of extent of disease. 2. Ultrasound-guided biopsy of the indeterminate right axillary lymph node. I have discussed the findings and recommendations with the patient. Results were also provided in writing at the conclusion of the visit. If applicable, a reminder letter will be sent to the patient regarding the next appointment. BI-RADS CATEGORY  5: Highly suggestive of malignancy. Electronically Signed   By: Pamelia Hoit M.D.   On: 01/30/2017 17:07   Mm Diag Breast Tomo Bilateral  Result Date: 01/30/2017 CLINICAL DATA:  46 year old female with palpable abnormality in the right breast at 12 o'clock for 3-4 months. EXAM: 2D DIGITAL DIAGNOSTIC BILATERAL MAMMOGRAM WITH CAD AND ADJUNCT TOMO ULTRASOUND RIGHT BREAST COMPARISON:  Previous exam(s). ACR Breast Density Category c: The breast tissue is heterogeneously dense, which may obscure small masses. FINDINGS: There is an irregular mass within the anterior, superior right breast underlying the palpable marker which spans approximately 5 cm. No suspicious mass, calcifications, or other abnormality is identified within the left breast. Mammographic images were processed with CAD. On physical exam, there is a firm palpable mass at 12 o'clock, 2 cm from the nipple, corresponding to the patient's area of concern. Targeted ultrasound is performed demonstrating an irregular mixed echogenicity area with posterior acoustic shadowing centered at 12 o'clock, 2 cm from the nipple spanning at least 4.6 x 3.4 x 2.7 cm. There is a similar-appearing adjacent area at 1 o'clock, 2 cm from the nipple measuring 1.3 x 1 x 1 cm which is thought to be contiguous with the area at 12 o'clock. Targeted ultrasound of the right axilla demonstrates a  right axillary lymph node with cortical thickening measuring up to 4 mm. IMPRESSION: 1. Suspicious right breast mass centered at 12 o'clock, 2 cm from the nipple. 2. Indeterminate right axillary lymph node. RECOMMENDATION: 1. Ultrasound-guided biopsy of the right breast mass at 12 o'clock, 2 cm from the nipple. If biopsy results demonstrate malignancy, breast MRI is recommended for further evaluation of extent of disease. 2. Ultrasound-guided biopsy of the indeterminate right axillary lymph node. I have discussed the  findings and recommendations with the patient. Results were also provided in writing at the conclusion of the visit. If applicable, a reminder letter will be sent to the patient regarding the next appointment. BI-RADS CATEGORY  5: Highly suggestive of malignancy. Electronically Signed   By: Pamelia Hoit M.D.   On: 01/30/2017 17:07   [place] Axillary Node Core Biopsy Right  Addendum Date: 02/03/2017   ADDENDUM REPORT: 02/02/2017 15:30 ADDENDUM: Pathology revealed GRADE II INVASIVE DUCTAL CARCINOMA, DUCTAL CARCINOMA IN SITU of the Right breast, 12:00. REACTIVE LYMPH NODE of the Right axilla. This was found to be concordant by Dr. Curlene Dolphin. Pathology results were discussed with the patient by telephone. The patient reported doing well after the biopsies with tenderness at the sites. Post biopsy instructions and care were reviewed and questions were answered. The patient was encouraged to call [facility] for any additional concerns. The patient was referred to [facility] on February 08, 2017. Recommendation for bilateral breast MRI for further evaluation of extent of disease. Pathology results reported by Terie Purser, RN on 02/01/2017. Electronically Signed   By: Curlene Dolphin M.D.   On: 02/02/2017 15:30   Result Date: 02/03/2017 CLINICAL DATA:  Right axillary lymph node with mild cortical thickening was  seen at the time of evaluation of a suspicious palpable mass in the right breast. Axillary lymph node biopsy was recommended. EXAM: ULTRASOUND GUIDED CORE NEEDLE BIOPSY OF A RIGHT AXILLARY NODE COMPARISON:  Previous exam(s). FINDINGS: I met with the patient and we discussed the procedure of ultrasound-guided biopsy, including benefits and alternatives. We discussed the high likelihood of a successful procedure. We discussed the risks of the procedure, including infection, bleeding, tissue injury, clip migration, and inadequate sampling. Informed written consent was given. The usual time-out protocol was performed immediately prior to the procedure. Using sterile technique and 1% Lidocaine as local anesthetic, under direct ultrasound visualization, a 14 gauge spring-loaded device was used to perform biopsy of a lymph node with cortical thickening in the right axilla using a lateral to medial approach. At the conclusion of the procedure a spiral HydroMARK tissue marker clip was deployed into the biopsy cavity. Follow up 2 view mammogram was performed and dictated separately. IMPRESSION: Ultrasound guided biopsy of right axillary lymph node. No apparent complications. Electronically Signed: By: Curlene Dolphin M.D. On: 01/31/2017 16:51   Mm Clip Placement Right  Result Date: 01/31/2017 CLINICAL DATA:  Suspicious mass in the right breast 12 o'clock position was biopsied under ultrasound guidance. Additionally, a single right axillary lymph node with mild cortical thickening was biopsied. EXAM: DIAGNOSTIC RIGHT MAMMOGRAM POST ULTRASOUND BIOPSY COMPARISON:  Previous exam(s). FINDINGS: Mammographic images were obtained following ultrasound guided biopsy of a right breast mass at 12 o'clock position and of a right axillary lymph node. A ribbon shaped biopsy clip is satisfactorily positioned within the palpable mass at 12 o'clock position in the right breast. The biopsied right axillary lymph node is not included in the  imaging field. IMPRESSION: Satisfactory position of ribbon shaped biopsy clip in the right breast mass. Final Assessment: Post Procedure Mammograms for Marker Placement Electronically Signed   By: Curlene Dolphin M.D.   On: 01/31/2017 16:56   [place] Rt Breast Bx W Loc Dev 1st Lesion Img Bx Spec US Guide  Addendum Date: 02/03/2017   ADDENDUM REPORT: 02/02/2017 15:31 ADDENDUM: Pathology revealed GRADE II INVASIVE DUCTAL CARCINOMA, DUCTAL CARCINOMA IN SITU of  the Right breast, 12:00. REACTIVE LYMPH NODE of the Right axilla. This was found to be concordant by Dr. Curlene Dolphin. Pathology results were discussed with the patient by telephone. The patient reported doing well after the biopsies with tenderness at the sites. Post biopsy instructions and care were reviewed and questions were answered. The patient was encouraged to call [facility] for any additional concerns. The patient was referred to [facility] on February 08, 2017. Recommendation for bilateral breast MRI for further evaluation of extent of disease. Pathology results reported by Terie Purser, RN on 02/01/2017. Electronically Signed   By: Curlene Dolphin M.D.   On: 02/02/2017 15:31   Result Date: 02/03/2017 CLINICAL DATA:  Ultrasound-guided biopsy was recommended of a suspicious palpable mass spanning the 12 to 1 o'clock position of the right breast. EXAM: ULTRASOUND GUIDED RIGHT BREAST CORE NEEDLE BIOPSY COMPARISON:  Previous exam(s). FINDINGS: I met with the patient and we discussed the procedure of ultrasound-guided biopsy, including benefits and alternatives. We discussed the high likelihood of a successful procedure. We discussed the risks of the procedure, including infection, bleeding, tissue injury, clip migration, and inadequate sampling. Informed written consent was given. The usual time-out protocol was performed immediately prior to the procedure.  Using sterile technique and 1% Lidocaine as local anesthetic, under direct ultrasound visualization, a 12 gauge spring-loaded device was used to perform biopsy of a suspicious palpable mass in the 12-1 o'clock position of the right breast using a lateral to medial approach. At the conclusion of the procedure a ribbon shaped tissue marker clip was deployed into the biopsy cavity. Follow up 2 view mammogram was performed and dictated separately. IMPRESSION: Ultrasound guided biopsy of right breast mass. No apparent complications. Electronically Signed: By: Curlene Dolphin M.D. On: 01/31/2017 16:49    ELIGIBLE FOR AVAILABLE RESEARCH PROTOCOL:   ASSESSMENT: 46 y.o.  [place] woman status post right breast biopsy 02/01/2017 for a cT2 pN0, stage IB invasive ductal carcinoma, grade 2, estrogen and progesterone receptor positive, with an MIB-1 of 25%, and HER-2 amplified   (a) biopsy of a suspicious right axillary lymph node 01/24/2017 showed only reactive lymphoid tissue  (1) genetics testing pending  (2) neoadjuvant chemotherapy will consist of carboplatin, docetaxel, and trastuzumab with pertuzumab given every 3 weeks 6, starting 02/16/2017   (3) anti-HER-2 immunotherapy to continue to total 1 year  (4) definitive surgery to follow at the completion of chemotherapy  (5) adjuvant radiation as appropriate  (6) anti-estrogens to follow the completion of local treatment   PLAN: Kema is doing well today.  Her last labs were normal.  She and I reviewed her chemotherapy regimen, their names, and common side effects.  She and I reviewed the timing of everything, and the overall plan.  We reviewed her anti-nausea medication regimen following her treatment and I sent in her prescriptions for such.  I gave her a handout of her nausea regimen and how to take it.  We reviewed Neulasta and the fact that she will need Neulasta injections on Saturdays after treatment.    Cassondra will return on Thursday, 02/16/2017  for her first cycle of chemotherapy.  She knows if she needs anything whatsoever during treatment to have the nurses call me and I will pop over there. She has Neulasta scheduled on 4/14, and f/u with Dr. Jana Hakim for lab and evaluation on 02/24/2017.     Quantia has a good understanding  of the overall plan. She agrees with it. She knows the goal of treatment in her case is cure. She will call with any problems that may develop before her next visit here.  A total of (30) minutes of face-to-face time was spent with this patient with greater than 50% of that time in counseling and care-coordination.   Scot Dock, NP   02/14/2017 12:57 PM Medical Oncology and Hematology [facility] [address] [place], [place] [address] Tel. [phone]    Fax. [phone]

## 2017-02-14 NOTE — Addendum Note (Signed)
Encounter addended by: Gery Pray, MD on: 02/14/2017  7:01 PM<BR>    Actions taken: Sign clinical note

## 2017-02-15 ENCOUNTER — Other Ambulatory Visit: Payer: Self-pay | Admitting: General Surgery

## 2017-02-15 ENCOUNTER — Telehealth: Payer: Self-pay | Admitting: *Deleted

## 2017-02-15 DIAGNOSIS — N63 Unspecified lump in unspecified breast: Secondary | ICD-10-CM

## 2017-02-15 NOTE — Telephone Encounter (Signed)
Attempted to reach patient re: appts tomorrow. Pt is 1st [facility] scheduled at 1000. I left non-descriptive message asking if she could come in earlier at 70 for labs and 930 in order to get back to treatment area sooner. Left direct line for her to return call; I did not adjust her appts.

## 2017-02-16 ENCOUNTER — Other Ambulatory Visit: Payer: Self-pay | Admitting: Oncology

## 2017-02-16 ENCOUNTER — Ambulatory Visit (HOSPITAL_COMMUNITY): Payer: 59

## 2017-02-16 ENCOUNTER — Other Ambulatory Visit (HOSPITAL_COMMUNITY): Payer: 59

## 2017-02-16 ENCOUNTER — Ambulatory Visit (HOSPITAL_BASED_OUTPATIENT_CLINIC_OR_DEPARTMENT_OTHER): Payer: 59

## 2017-02-16 ENCOUNTER — Other Ambulatory Visit (HOSPITAL_BASED_OUTPATIENT_CLINIC_OR_DEPARTMENT_OTHER): Payer: 59

## 2017-02-16 ENCOUNTER — Encounter: Payer: Self-pay | Admitting: *Deleted

## 2017-02-16 VITALS — BP 136/83 | HR 69 | Temp 98.3°F | Resp 18

## 2017-02-16 DIAGNOSIS — C50411 Malignant neoplasm of upper-outer quadrant of right female breast: Secondary | ICD-10-CM

## 2017-02-16 DIAGNOSIS — Z170 Estrogen receptor positive status [ER+]: Principal | ICD-10-CM

## 2017-02-16 DIAGNOSIS — Z5112 Encounter for antineoplastic immunotherapy: Secondary | ICD-10-CM | POA: Diagnosis not present

## 2017-02-16 DIAGNOSIS — Z5111 Encounter for antineoplastic chemotherapy: Secondary | ICD-10-CM

## 2017-02-16 LAB — CBC WITH DIFFERENTIAL/PLATELET
BASO%: 0.3 % (ref 0.0–2.0)
BASOS ABS: 0 10*3/uL (ref 0.0–0.1)
EOS ABS: 0 10*3/uL (ref 0.0–0.5)
EOS%: 0 % (ref 0.0–7.0)
HEMATOCRIT: 35.9 % (ref 34.8–46.6)
HEMOGLOBIN: 12.2 g/dL (ref 11.6–15.9)
LYMPH#: 1 10*3/uL (ref 0.9–3.3)
LYMPH%: 7.3 % — ABNORMAL LOW (ref 14.0–49.7)
MCH: 26 pg (ref 25.1–34.0)
MCHC: 33.9 g/dL (ref 31.5–36.0)
MCV: 76.5 fL — ABNORMAL LOW (ref 79.5–101.0)
MONO#: 1.2 10*3/uL — ABNORMAL HIGH (ref 0.1–0.9)
MONO%: 8.7 % (ref 0.0–14.0)
NEUT%: 83.7 % — ABNORMAL HIGH (ref 38.4–76.8)
NEUTROS ABS: 11.4 10*3/uL — AB (ref 1.5–6.5)
Platelets: 379 10*3/uL (ref 145–400)
RBC: 4.69 10*6/uL (ref 3.70–5.45)
RDW: 14.6 % — ABNORMAL HIGH (ref 11.2–14.5)
WBC: 13.6 10*3/uL — AB (ref 3.9–10.3)

## 2017-02-16 LAB — COMPREHENSIVE METABOLIC PANEL
ALBUMIN: 4.4 g/dL (ref 3.5–5.0)
ALK PHOS: 50 U/L (ref 40–150)
ALT: 11 U/L (ref 0–55)
AST: 12 U/L (ref 5–34)
Anion Gap: 13 mEq/L — ABNORMAL HIGH (ref 3–11)
BUN: 10.1 mg/dL (ref 7.0–26.0)
CALCIUM: 10.3 mg/dL (ref 8.4–10.4)
CO2: 27 mEq/L (ref 22–29)
CREATININE: 0.8 mg/dL (ref 0.6–1.1)
Chloride: 103 mEq/L (ref 98–109)
EGFR: >90 mL/min/{1.73_m2} (ref >90)
GLUCOSE: 146 mg/dL — AB (ref 70–140)
Potassium: 3.9 mEq/L (ref 3.5–5.1)
SODIUM: 142 meq/L (ref 136–145)
Total Bilirubin: 0.49 mg/dL (ref 0.20–1.20)
Total Protein: 8.2 g/dL (ref 6.4–8.3)

## 2017-02-16 MED ORDER — HEPARIN SOD (PORK) LOCK FLUSH 100 UNIT/ML IV SOLN
500.0000 [IU] | Freq: Once | INTRAVENOUS | Status: AC | PRN
  Administered 2017-02-16: 500 [IU]
  Filled 2017-02-16: qty 5

## 2017-02-16 MED ORDER — PALONOSETRON HCL INJECTION 0.25 MG/5ML
0.2500 mg | Freq: Once | INTRAVENOUS | Status: AC
  Administered 2017-02-16: 0.25 mg via INTRAVENOUS

## 2017-02-16 MED ORDER — ACETAMINOPHEN 325 MG PO TABS
650.0000 mg | ORAL_TABLET | Freq: Once | ORAL | Status: AC
  Administered 2017-02-16: 650 mg via ORAL

## 2017-02-16 MED ORDER — DIPHENHYDRAMINE HCL 25 MG PO CAPS
ORAL_CAPSULE | ORAL | Status: AC
  Filled 2017-02-16: qty 2

## 2017-02-16 MED ORDER — SODIUM CHLORIDE 0.9 % IV SOLN
606.5000 mg | Freq: Once | INTRAVENOUS | Status: AC
  Administered 2017-02-16: 610 mg via INTRAVENOUS
  Filled 2017-02-16: qty 61

## 2017-02-16 MED ORDER — ACETAMINOPHEN 325 MG PO TABS
ORAL_TABLET | ORAL | Status: AC
  Filled 2017-02-16: qty 2

## 2017-02-16 MED ORDER — DEXAMETHASONE SODIUM PHOSPHATE 10 MG/ML IJ SOLN
INTRAMUSCULAR | Status: AC
  Filled 2017-02-16: qty 1

## 2017-02-16 MED ORDER — TRASTUZUMAB CHEMO 150 MG IV SOLR
8.0000 mg/kg | Freq: Once | INTRAVENOUS | Status: AC
  Administered 2017-02-16: 546 mg via INTRAVENOUS
  Filled 2017-02-16: qty 26

## 2017-02-16 MED ORDER — SODIUM CHLORIDE 0.9 % IV SOLN
840.0000 mg | Freq: Once | INTRAVENOUS | Status: AC
  Administered 2017-02-16: 840 mg via INTRAVENOUS
  Filled 2017-02-16: qty 28

## 2017-02-16 MED ORDER — SODIUM CHLORIDE 0.9 % IV SOLN: 10.0000 mg | Freq: Once | INTRAVENOUS | Status: DC

## 2017-02-16 MED ORDER — SODIUM CHLORIDE 0.9% FLUSH
10.0000 mL | INTRAVENOUS | Status: DC | PRN
Start: 2017-02-16 — End: 2017-02-16
  Administered 2017-02-16: 10 mL
  Filled 2017-02-16: qty 10

## 2017-02-16 MED ORDER — DOCETAXEL CHEMO INJECTION 160 MG/16ML
75.0000 mg/m2 | Freq: Once | INTRAVENOUS | Status: AC
  Administered 2017-02-16: 130 mg via INTRAVENOUS
  Filled 2017-02-16: qty 13

## 2017-02-16 MED ORDER — SODIUM CHLORIDE 0.9 % IV SOLN
Freq: Once | INTRAVENOUS | Status: AC
  Administered 2017-02-16: 11:00:00 via INTRAVENOUS

## 2017-02-16 MED ORDER — DIPHENHYDRAMINE HCL 25 MG PO CAPS
50.0000 mg | ORAL_CAPSULE | Freq: Once | ORAL | Status: AC
  Administered 2017-02-16: 50 mg via ORAL

## 2017-02-16 MED ORDER — DEXAMETHASONE SODIUM PHOSPHATE 10 MG/ML IJ SOLN
10.0000 mg | Freq: Once | INTRAMUSCULAR | Status: AC
  Administered 2017-02-16: 10 mg via INTRAVENOUS

## 2017-02-16 MED ORDER — PALONOSETRON HCL INJECTION 0.25 MG/5ML
INTRAVENOUS | Status: AC
  Filled 2017-02-16: qty 5

## 2017-02-16 NOTE — Progress Notes (Signed)
Pt tolerated treatment well.

## 2017-02-16 NOTE — Patient Instructions (Addendum)
[facility] Discharge Instructions for Patients Receiving Chemotherapy  Today you received the following chemotherapy agents: Herceptin, Perjeta, Taxotere and Carboplatin   To help prevent nausea and vomiting after your treatment, we encourage you to take your nausea medication as directed.    If you develop nausea and vomiting that is not controlled by your nausea medication, call the clinic.   BELOW ARE SYMPTOMS THAT SHOULD BE REPORTED IMMEDIATELY:  *FEVER GREATER THAN 100.5 F  *CHILLS WITH OR WITHOUT FEVER  NAUSEA AND VOMITING THAT IS NOT CONTROLLED WITH YOUR NAUSEA MEDICATION  *UNUSUAL SHORTNESS OF BREATH  *UNUSUAL BRUISING OR BLEEDING  TENDERNESS IN MOUTH AND THROAT WITH OR WITHOUT PRESENCE OF ULCERS  *URINARY PROBLEMS  *BOWEL PROBLEMS  UNUSUAL RASH Items with * indicate a potential emergency and should be followed up as soon as possible.  Feel free to call the clinic you have any questions or concerns. The clinic phone number is [phone].  Please show the [facility] at check-in to the Emergency Department and triage nurse.  Trastuzumab injection for infusion What is this medicine? TRASTUZUMAB (tras TOO zoo mab) is a monoclonal antibody. It is used to treat breast cancer and stomach cancer. This medicine may be used for other purposes; ask your health care provider or pharmacist if you have questions. COMMON BRAND NAME(S): Herceptin What should I tell my health care provider before I take this medicine? They need to know if you have any of these conditions: -heart disease -heart failure -lung or breathing disease, like asthma -an unusual or allergic reaction to trastuzumab, benzyl alcohol, or other medications, foods, dyes, or preservatives -pregnant or trying to get pregnant -breast-feeding How should I use this medicine? This drug is given as an infusion into a vein. It is administered in a hospital or clinic by a specially trained  health care professional. Talk to your pediatrician regarding the use of this medicine in children. This medicine is not approved for use in children. Overdosage: If you think you have taken too much of this medicine contact a poison control center or emergency room at once. NOTE: This medicine is only for you. Do not share this medicine with others. What if I miss a dose? It is important not to miss a dose. Call your doctor or health care professional if you are unable to keep an appointment. What may interact with this medicine? This medicine may interact with the following medications: -certain types of chemotherapy, such as daunorubicin, doxorubicin, epirubicin, and idarubicin This list may not describe all possible interactions. Give your health care provider a list of all the medicines, herbs, non-prescription drugs, or dietary supplements you use. Also tell them if you smoke, drink alcohol, or use illegal drugs. Some items may interact with your medicine. What should I watch for while using this medicine? Visit your doctor for checks on your progress. Report any side effects. Continue your course of treatment even though you feel ill unless your doctor tells you to stop. Call your doctor or health care professional for advice if you get a fever, chills or sore throat, or other symptoms of a cold or flu. Do not treat yourself. Try to avoid being around people who are sick. You may experience fever, chills and shaking during your first infusion. These effects are usually mild and can be treated with other medicines. Report any side effects during the infusion to your health care professional. Fever and chills usually do not happen with later infusions.  Do not become pregnant while taking this medicine or for 7 months after stopping it. Women should inform their doctor if they wish to become pregnant or think they might be pregnant. Women of child-bearing potential will need to have a negative  pregnancy test before starting this medicine. There is a potential for serious side effects to an unborn child. Talk to your health care professional or pharmacist for more information. Do not breast-feed an infant while taking this medicine or for 7 months after stopping it. Women must use effective birth control with this medicine. What side effects may I notice from receiving this medicine? Side effects that you should report to your doctor or health care professional as soon as possible: -allergic reactions like skin rash, itching or hives, swelling of the face, lips, or tongue -chest pain or palpitations -cough -dizziness -feeling faint or lightheaded, falls -fever -general ill feeling or flu-like symptoms -signs of worsening heart failure like breathing problems; swelling in your legs and feet -unusually weak or tired Side effects that usually do not require medical attention (report to your doctor or health care professional if they continue or are bothersome): -bone pain -changes in taste -diarrhea -joint pain -nausea/vomiting -weight loss This list may not describe all possible side effects. Call your doctor for medical advice about side effects. You may report side effects to FDA at 1-800-FDA-1088. Where should I keep my medicine? This drug is given in a hospital or clinic and will not be stored at home. NOTE: This sheet is a summary. It may not cover all possible information. If you have questions about this medicine, talk to your doctor, pharmacist, or health care provider.  2018 Elsevier/Gold Standard (2016-10-18 14:37:52)   Pertuzumab injection What is this medicine? PERTUZUMAB (per TOOZ ue mab) is a monoclonal antibody. It is used to treat breast cancer. This medicine may be used for other purposes; ask your health care provider or pharmacist if you have questions. COMMON BRAND NAME(S): PERJETA What should I tell my health care provider before I take this  medicine? They need to know if you have any of these conditions: -heart disease -heart failure -high blood pressure -history of irregular heart beat -recent or ongoing radiation therapy -an unusual or allergic reaction to pertuzumab, other medicines, foods, dyes, or preservatives -pregnant or trying to get pregnant -breast-feeding How should I use this medicine? This medicine is for infusion into a vein. It is given by a health care professional in a hospital or clinic setting. Talk to your pediatrician regarding the use of this medicine in children. Special care may be needed. Overdosage: If you think you have taken too much of this medicine contact a poison control center or emergency room at once. NOTE: This medicine is only for you. Do not share this medicine with others. What if I miss a dose? It is important not to miss your dose. Call your doctor or health care professional if you are unable to keep an appointment. What may interact with this medicine? Interactions are not expected. Give your health care provider a list of all the medicines, herbs, non-prescription drugs, or dietary supplements you use. Also tell them if you smoke, drink alcohol, or use illegal drugs. Some items may interact with your medicine. This list may not describe all possible interactions. Give your health care provider a list of all the medicines, herbs, non-prescription drugs, or dietary supplements you use. Also tell them if you smoke, drink alcohol, or use illegal drugs. Some  items may interact with your medicine. What should I watch for while using this medicine? Your condition will be monitored carefully while you are receiving this medicine. Report any side effects. Continue your course of treatment even though you feel ill unless your doctor tells you to stop. Do not become pregnant while taking this medicine or for 7 months after stopping it. Women should inform their doctor if they wish to become  pregnant or think they might be pregnant. Women of child-bearing potential will need to have a negative pregnancy test before starting this medicine. There is a potential for serious side effects to an unborn child. Talk to your health care professional or pharmacist for more information. Do not breast-feed an infant while taking this medicine or for 7 months after stopping it. Women must use effective birth control with this medicine. Call your doctor or health care professional for advice if you get a fever, chills or sore throat, or other symptoms of a cold or flu. Do not treat yourself. Try to avoid being around people who are sick. You may experience fever, chills, and headache during the infusion. Report any side effects during the infusion to your health care professional. What side effects may I notice from receiving this medicine? Side effects that you should report to your doctor or health care professional as soon as possible: -breathing problems -chest pain or palpitations -dizziness -feeling faint or lightheaded -fever or chills -skin rash, itching or hives -sore throat -swelling of the face, lips, or tongue -swelling of the legs or ankles -unusually weak or tired Side effects that usually do not require medical attention (report to your doctor or health care professional if they continue or are bothersome): -diarrhea -hair loss -nausea, vomiting -tiredness This list may not describe all possible side effects. Call your doctor for medical advice about side effects. You may report side effects to FDA at 1-800-FDA-1088. Where should I keep my medicine? This drug is given in a hospital or clinic and will not be stored at home. NOTE: This sheet is a summary. It may not cover all possible information. If you have questions about this medicine, talk to your doctor, pharmacist, or health care provider.  2018 Elsevier/Gold Standard (2015-11-26 12:08:50)   Docetaxel injection What  is this medicine? DOCETAXEL (doe se TAX el) is a chemotherapy drug. It targets fast dividing cells, like cancer cells, and causes these cells to die. This medicine is used to treat many types of cancers like breast cancer, certain stomach cancers, head and neck cancer, lung cancer, and prostate cancer. This medicine may be used for other purposes; ask your health care provider or pharmacist if you have questions. COMMON BRAND NAME(S): Docefrez, Taxotere What should I tell my health care provider before I take this medicine? They need to know if you have any of these conditions: -infection (especially a virus infection such as chickenpox, cold sores, or herpes) -liver disease -low blood counts, like low white cell, platelet, or red cell counts -an unusual or allergic reaction to docetaxel, polysorbate 80, other chemotherapy agents, other medicines, foods, dyes, or preservatives -pregnant or trying to get pregnant -breast-feeding How should I use this medicine? This drug is given as an infusion into a vein. It is administered in a hospital or clinic by a specially trained health care professional. Talk to your pediatrician regarding the use of this medicine in children. Special care may be needed. Overdosage: If you think you have taken too much of this medicine  contact a poison control center or emergency room at once. NOTE: This medicine is only for you. Do not share this medicine with others. What if I miss a dose? It is important not to miss your dose. Call your doctor or health care professional if you are unable to keep an appointment. What may interact with this medicine? -cyclosporine -erythromycin -ketoconazole -medicines to increase blood counts like filgrastim, pegfilgrastim, sargramostim -vaccines Talk to your doctor or health care professional before taking any of these medicines: -acetaminophen -aspirin -ibuprofen -ketoprofen -naproxen This list may not describe all  possible interactions. Give your health care provider a list of all the medicines, herbs, non-prescription drugs, or dietary supplements you use. Also tell them if you smoke, drink alcohol, or use illegal drugs. Some items may interact with your medicine. What should I watch for while using this medicine? Your condition will be monitored carefully while you are receiving this medicine. You will need important blood work done while you are taking this medicine. This drug may make you feel generally unwell. This is not uncommon, as chemotherapy can affect healthy cells as well as cancer cells. Report any side effects. Continue your course of treatment even though you feel ill unless your doctor tells you to stop. In some cases, you may be given additional medicines to help with side effects. Follow all directions for their use. Call your doctor or health care professional for advice if you get a fever, chills or sore throat, or other symptoms of a cold or flu. Do not treat yourself. This drug decreases your body's ability to fight infections. Try to avoid being around people who are sick. This medicine may increase your risk to bruise or bleed. Call your doctor or health care professional if you notice any unusual bleeding. This medicine may contain alcohol in the product. You may get drowsy or dizzy. Do not drive, use machinery, or do anything that needs mental alertness until you know how this medicine affects you. Do not stand or sit up quickly, especially if you are an older patient. This reduces the risk of dizzy or fainting spells. Avoid alcoholic drinks. Do not become pregnant while taking this medicine. Women should inform their doctor if they wish to become pregnant or think they might be pregnant. There is a potential for serious side effects to an unborn child. Talk to your health care professional or pharmacist for more information. Do not breast-feed an infant while taking this medicine. What  side effects may I notice from receiving this medicine? Side effects that you should report to your doctor or health care professional as soon as possible: -allergic reactions like skin rash, itching or hives, swelling of the face, lips, or tongue -low blood counts - This drug may decrease the number of white blood cells, red blood cells and platelets. You may be at increased risk for infections and bleeding. -signs of infection - fever or chills, cough, sore throat, pain or difficulty passing urine -signs of decreased platelets or bleeding - bruising, pinpoint red spots on the skin, black, tarry stools, nosebleeds -signs of decreased red blood cells - unusually weak or tired, fainting spells, lightheadedness -breathing problems -fast or irregular heartbeat -low blood pressure -mouth sores -nausea and vomiting -pain, swelling, redness or irritation at the injection site -pain, tingling, numbness in the hands or feet -swelling of the ankle, feet, hands -weight gain Side effects that usually do not require medical attention (report to your doctor or health care professional if  they continue or are bothersome): -bone pain -complete hair loss including hair on your head, underarms, pubic hair, eyebrows, and eyelashes -diarrhea -excessive tearing -changes in the color of fingernails -loosening of the fingernails -nausea -muscle pain -red flush to skin -sweating -weak or tired This list may not describe all possible side effects. Call your doctor for medical advice about side effects. You may report side effects to FDA at 1-800-FDA-1088. Where should I keep my medicine? This drug is given in a hospital or clinic and will not be stored at home. NOTE: This sheet is a summary. It may not cover all possible information. If you have questions about this medicine, talk to your doctor, pharmacist, or health care provider.  2018 Elsevier/Gold Standard (2015-11-26 12:32:56)    Carboplatin  injection What is this medicine? CARBOPLATIN (KAR boe pla tin) is a chemotherapy drug. It targets fast dividing cells, like cancer cells, and causes these cells to die. This medicine is used to treat ovarian cancer and many other cancers. This medicine may be used for other purposes; ask your health care provider or pharmacist if you have questions. COMMON BRAND NAME(S): Paraplatin What should I tell my health care provider before I take this medicine? They need to know if you have any of these conditions: -blood disorders -hearing problems -kidney disease -recent or ongoing radiation therapy -an unusual or allergic reaction to carboplatin, cisplatin, other chemotherapy, other medicines, foods, dyes, or preservatives -pregnant or trying to get pregnant -breast-feeding How should I use this medicine? This drug is usually given as an infusion into a vein. It is administered in a hospital or clinic by a specially trained health care professional. Talk to your pediatrician regarding the use of this medicine in children. Special care may be needed. Overdosage: If you think you have taken too much of this medicine contact a poison control center or emergency room at once. NOTE: This medicine is only for you. Do not share this medicine with others. What if I miss a dose? It is important not to miss a dose. Call your doctor or health care professional if you are unable to keep an appointment. What may interact with this medicine? -medicines for seizures -medicines to increase blood counts like filgrastim, pegfilgrastim, sargramostim -some antibiotics like amikacin, gentamicin, neomycin, streptomycin, tobramycin -vaccines Talk to your doctor or health care professional before taking any of these medicines: -acetaminophen -aspirin -ibuprofen -ketoprofen -naproxen This list may not describe all possible interactions. Give your health care provider a list of all the medicines, herbs,  non-prescription drugs, or dietary supplements you use. Also tell them if you smoke, drink alcohol, or use illegal drugs. Some items may interact with your medicine. What should I watch for while using this medicine? Your condition will be monitored carefully while you are receiving this medicine. You will need important blood work done while you are taking this medicine. This drug may make you feel generally unwell. This is not uncommon, as chemotherapy can affect healthy cells as well as cancer cells. Report any side effects. Continue your course of treatment even though you feel ill unless your doctor tells you to stop. In some cases, you may be given additional medicines to help with side effects. Follow all directions for their use. Call your doctor or health care professional for advice if you get a fever, chills or sore throat, or other symptoms of a cold or flu. Do not treat yourself. This drug decreases your body's ability to fight infections. Try  to avoid being around people who are sick. This medicine may increase your risk to bruise or bleed. Call your doctor or health care professional if you notice any unusual bleeding. Be careful brushing and flossing your teeth or using a toothpick because you may get an infection or bleed more easily. If you have any dental work done, tell your dentist you are receiving this medicine. Avoid taking products that contain aspirin, acetaminophen, ibuprofen, naproxen, or ketoprofen unless instructed by your doctor. These medicines may hide a fever. Do not become pregnant while taking this medicine. Women should inform their doctor if they wish to become pregnant or think they might be pregnant. There is a potential for serious side effects to an unborn child. Talk to your health care professional or pharmacist for more information. Do not breast-feed an infant while taking this medicine. What side effects may I notice from receiving this medicine? Side effects  that you should report to your doctor or health care professional as soon as possible: -allergic reactions like skin rash, itching or hives, swelling of the face, lips, or tongue -signs of infection - fever or chills, cough, sore throat, pain or difficulty passing urine -signs of decreased platelets or bleeding - bruising, pinpoint red spots on the skin, black, tarry stools, nosebleeds -signs of decreased red blood cells - unusually weak or tired, fainting spells, lightheadedness -breathing problems -changes in hearing -changes in vision -chest pain -high blood pressure -low blood counts - This drug may decrease the number of white blood cells, red blood cells and platelets. You may be at increased risk for infections and bleeding. -nausea and vomiting -pain, swelling, redness or irritation at the injection site -pain, tingling, numbness in the hands or feet -problems with balance, talking, walking -trouble passing urine or change in the amount of urine Side effects that usually do not require medical attention (report to your doctor or health care professional if they continue or are bothersome): -hair loss -loss of appetite -metallic taste in the mouth or changes in taste This list may not describe all possible side effects. Call your doctor for medical advice about side effects. You may report side effects to FDA at 1-800-FDA-1088. Where should I keep my medicine? This drug is given in a hospital or clinic and will not be stored at home. NOTE: This sheet is a summary. It may not cover all possible information. If you have questions about this medicine, talk to your doctor, pharmacist, or health care provider.  2018 Elsevier/Gold Standard (2008-01-29 14:38:05)   Palonosetron Injection What is this medicine? PALONOSETRON (pal oh NOE se tron) is used to prevent nausea and vomiting caused by chemotherapy. It also helps prevent delayed nausea and vomiting that may occur a few days after  your treatment. This medicine may be used for other purposes; ask your health care provider or pharmacist if you have questions. COMMON BRAND NAME(S): Aloxi What should I tell my health care provider before I take this medicine? They need to know if you have any of these conditions: -an unusual or allergic reaction to palonosetron, dolasetron, granisetron, ondansetron, other medicines, foods, dyes, or preservatives -pregnant or trying to get pregnant -breast-feeding How should I use this medicine? This medicine is for infusion into a vein. It is given by a health care professional in a hospital or clinic setting. Talk to your pediatrician regarding the use of this medicine in children. While this drug may be prescribed for children as young as 1 month for  selected conditions, precautions do apply. Overdosage: If you think you have taken too much of this medicine contact a poison control center or emergency room at once. NOTE: This medicine is only for you. Do not share this medicine with others. What if I miss a dose? This does not apply. What may interact with this medicine? -certain medicines for depression, anxiety, or psychotic disturbances -fentanyl -linezolid -MAOIs like Carbex, Eldepryl, Marplan, Nardil, and Parnate -methylene blue (injected into a vein) -tramadol This list may not describe all possible interactions. Give your health care provider a list of all the medicines, herbs, non-prescription drugs, or dietary supplements you use. Also tell them if you smoke, drink alcohol, or use illegal drugs. Some items may interact with your medicine. What should I watch for while using this medicine? Your condition will be monitored carefully while you are receiving this medicine. What side effects may I notice from receiving this medicine? Side effects that you should report to your doctor or health care professional as soon as possible: -allergic reactions like skin rash, itching or  hives, swelling of the face, lips, or tongue -breathing problems -confusion -dizziness -fast, irregular heartbeat -fever and chills -loss of balance or coordination -seizures -sweating -swelling of the hands and feet -tremors -unusually weak or tired Side effects that usually do not require medical attention (report to your doctor or health care professional if they continue or are bothersome): -constipation or diarrhea -headache This list may not describe all possible side effects. Call your doctor for medical advice about side effects. You may report side effects to FDA at 1-800-FDA-1088. Where should I keep my medicine? This drug is given in a hospital or clinic and will not be stored at home. NOTE: This sheet is a summary. It may not cover all possible information. If you have questions about this medicine, talk to your doctor, pharmacist, or health care provider.  2018 Elsevier/Gold Standard (2013-08-30 10:38:36)

## 2017-02-17 ENCOUNTER — Ambulatory Visit
Admission: RE | Admit: 2017-02-17 | Discharge: 2017-02-17 | Disposition: A | Discharge status: Home / Self Care | Payer: 59 | Source: Ambulatory Visit | Attending: General Surgery | Admitting: General Surgery

## 2017-02-17 ENCOUNTER — Telehealth: Payer: Self-pay

## 2017-02-17 ENCOUNTER — Other Ambulatory Visit: Payer: Self-pay | Admitting: General Surgery

## 2017-02-17 DIAGNOSIS — N63 Unspecified lump in unspecified breast: Secondary | ICD-10-CM

## 2017-02-17 NOTE — Telephone Encounter (Signed)
-----   Message from Egbert Garibaldi, RN sent at 02/16/2017  6:31 PM EDT ----- Regarding: magrinat chemo f/u call  Pt of Dr. Jana Hakim, first time herceptin, perjeta, taxotere, carboplatin. Pt tolerated well.

## 2017-02-18 ENCOUNTER — Ambulatory Visit (HOSPITAL_BASED_OUTPATIENT_CLINIC_OR_DEPARTMENT_OTHER): Payer: 59

## 2017-02-18 VITALS — BP 130/78 | HR 79 | Temp 98.5°F | Resp 18

## 2017-02-18 DIAGNOSIS — Z5189 Encounter for other specified aftercare: Secondary | ICD-10-CM

## 2017-02-18 DIAGNOSIS — C50411 Malignant neoplasm of upper-outer quadrant of right female breast: Secondary | ICD-10-CM

## 2017-02-18 DIAGNOSIS — Z170 Estrogen receptor positive status [ER+]: Principal | ICD-10-CM

## 2017-02-18 MED ORDER — PEGFILGRASTIM INJECTION 6 MG/0.6ML ~~LOC~~
6.0000 mg | PREFILLED_SYRINGE | Freq: Once | SUBCUTANEOUS | Status: AC
  Administered 2017-02-18: 6 mg via SUBCUTANEOUS

## 2017-02-20 ENCOUNTER — Other Ambulatory Visit: Payer: Self-pay | Admitting: Oncology

## 2017-02-20 DIAGNOSIS — C50411 Malignant neoplasm of upper-outer quadrant of right female breast: Secondary | ICD-10-CM

## 2017-02-20 DIAGNOSIS — Z170 Estrogen receptor positive status [ER+]: Principal | ICD-10-CM

## 2017-02-21 ENCOUNTER — Encounter: Payer: Self-pay | Admitting: *Deleted

## 2017-02-21 NOTE — Progress Notes (Signed)
[employer]  Holiday representative received referral from [employer] for financial concerns.  CSW contacted patient at home to offer support and assess for needs.  Patient stated she received updated information about her insurance and feels "much better".  CSW informed patient of additional support services available.  Patient did express interest in some of the support programs at [facility] and plans to attend.  CSW provided contact information and encouraged patient to call with any needs or concerns.       Johnnye Lana, MSW, LCSW, OSW-C Clinical Social Worker [facility] [phone]

## 2017-02-24 ENCOUNTER — Ambulatory Visit: Payer: 59 | Admitting: Oncology

## 2017-02-24 ENCOUNTER — Other Ambulatory Visit: Payer: Self-pay

## 2017-02-24 ENCOUNTER — Other Ambulatory Visit: Payer: 59

## 2017-02-24 ENCOUNTER — Ambulatory Visit (HOSPITAL_BASED_OUTPATIENT_CLINIC_OR_DEPARTMENT_OTHER): Payer: 59 | Admitting: Oncology

## 2017-02-24 ENCOUNTER — Other Ambulatory Visit (HOSPITAL_BASED_OUTPATIENT_CLINIC_OR_DEPARTMENT_OTHER): Payer: 59

## 2017-02-24 ENCOUNTER — Other Ambulatory Visit: Payer: Self-pay | Admitting: *Deleted

## 2017-02-24 VITALS — BP 128/78 | HR 95 | Temp 98.1°F | Resp 20 | Ht 66.0 in | Wt 145.6 lb

## 2017-02-24 DIAGNOSIS — K5900 Constipation, unspecified: Secondary | ICD-10-CM | POA: Diagnosis not present

## 2017-02-24 DIAGNOSIS — Z170 Estrogen receptor positive status [ER+]: Principal | ICD-10-CM

## 2017-02-24 DIAGNOSIS — C50411 Malignant neoplasm of upper-outer quadrant of right female breast: Secondary | ICD-10-CM

## 2017-02-24 DIAGNOSIS — C50511 Malignant neoplasm of lower-outer quadrant of right female breast: Secondary | ICD-10-CM

## 2017-02-24 LAB — COMPREHENSIVE METABOLIC PANEL
ALBUMIN: 3.7 g/dL (ref 3.5–5.0)
ALT: 15 U/L (ref 0–55)
AST: 21 U/L (ref 5–34)
Alkaline Phosphatase: 136 U/L (ref 40–150)
Anion Gap: 12 mEq/L — ABNORMAL HIGH (ref 3–11)
BUN: 8.7 mg/dL (ref 7.0–26.0)
CO2: 28 meq/L (ref 22–29)
Calcium: 10 mg/dL (ref 8.4–10.4)
Chloride: 101 mEq/L (ref 98–109)
Creatinine: 0.8 mg/dL (ref 0.6–1.1)
Glucose: 122 mg/dl (ref 70–140)
Potassium: 4.6 mEq/L (ref 3.5–5.1)
SODIUM: 141 meq/L (ref 136–145)
TOTAL PROTEIN: 7.4 g/dL (ref 6.4–8.3)

## 2017-02-24 LAB — CBC WITH DIFFERENTIAL/PLATELET
BASO%: 0.2 % (ref 0.0–2.0)
Basophils Absolute: 0.1 10*3/uL (ref 0.0–0.1)
EOS%: 0 % (ref 0.0–7.0)
Eosinophils Absolute: 0 10*3/uL (ref 0.0–0.5)
HCT: 33.2 % — ABNORMAL LOW (ref 34.8–46.6)
HEMOGLOBIN: 11.1 g/dL — AB (ref 11.6–15.9)
LYMPH%: 5.5 % — AB (ref 14.0–49.7)
MCH: 25.4 pg (ref 25.1–34.0)
MCHC: 33.4 g/dL (ref 31.5–36.0)
MCV: 75.9 fL — AB (ref 79.5–101.0)
MONO#: 6.3 10*3/uL — ABNORMAL HIGH (ref 0.1–0.9)
MONO%: 12.5 % (ref 0.0–14.0)
NEUT%: 81.8 % — ABNORMAL HIGH (ref 38.4–76.8)
NEUTROS ABS: 41.3 10*3/uL — AB (ref 1.5–6.5)
Platelets: 224 10*3/uL (ref 145–400)
RBC: 4.38 10*6/uL (ref 3.70–5.45)
RDW: 14.9 % — AB (ref 11.2–14.5)
WBC: 50.5 10*3/uL — AB (ref 3.9–10.3)
lymph#: 2.8 10*3/uL (ref 0.9–3.3)

## 2017-02-24 NOTE — Progress Notes (Signed)
[place]  [phone] [phone]     ID: MALORI MYERS DOB: 06/12/1971  MR#: [id number]  KPT#:[id number]  Patient Care Team: Ann Held, DO as PCP - General (Family Medicine) Excell Seltzer, MD as Consulting Physician (General Surgery) Chauncey Cruel, MD as Consulting Physician (Oncology) Gery Pray, MD as Consulting Physician (Radiation Oncology) Chauncey Cruel, MD OTHER MD:  CHIEF COMPLAINT: Triple positive breast cancer  CURRENT TREATMENT: Neoadjuvant chemotherapy   BREAST CANCER HISTORY: From the original intake note:  Lynann noted a change in her right breast around November 2017. She was not very concerned about it but did mention it at the time of mammography which had been scheduled for 01/30/2017. Accordingly she had bilateral diagnostic mammography with tomography and right breast ultrasonography that day at the [facility]. In the right breast there was an irregular mass measuring approximately 5 cm. On exam this was firm and palpable at the 12:00 location 2 cm from the nipple targeted ultrasonography showed an area of mixed echogenicity superiorly measuring at least 4.6 cm with a similar appearing adjacent area measuring 1.3 cm thought to be contiguous ultrasound of the right axilla showed one ymph node with cortical thickening.  Biopsy of the right breast mass 01/31/2017 showed ([id number]) invasive ductal carcinoma, grade 2, estrogen receptor 100% positive, with strong staining intensity, progesterone receptor 25% positive, with moderate staining intensity, with an MIB-1 of 25%, and HER-2 amplified with a signals ratio of 2.35, the number per cell being 4.0. Biopsy of the suspicious axillary lymph node showed only reactive  Her subsequent history is as detailed below  INTERVAL HISTORY: Maeryn returns today for follow-up of her breast cancers. Today is day 9 cycle 1 of 6 planned cycles of  trastuzumab/pertuzumab with carboplating/docetaxel. She did generally well with the first cycle and was able to return to work the Monday following  (day 5).  She did feel fatigued. She received the neulasta day 3 (Sat) and had "terrible " back pain for 3 days. She used percocet for this. This may have been the cause of her constipation--diarrhea is mor3e common with the treatment she is receiving. She had no nausea, no vomiting, no mouth sores. Her port worked well. She was able to take little walks in the evening.  REVIEW OF SYSTEMS: A detailed ROS today was otherwise noncontributory  PAST MEDICAL HISTORY: Past Medical History:  Diagnosis Date  . Cervical dysplasia     PAST SURGICAL HISTORY: Past Surgical History:  Procedure Laterality Date  . BREAST EXCISIONAL BIOPSY Right   . BREAST SURGERY     right breast benign tumor     FAMILY HISTORY Family History  Problem Relation Age of Onset  . Hypertension Mother   . Diabetes Father   . Leukemia Paternal Grandfather   . Breast cancer Cousin 55  . Cancer Paternal Uncle     details unknown  . Cancer Maternal Uncle     details unknown  The patient's parents are living, both in their early 52s as of April 2018. She has one brother and one sister. On the paternal side a grandfather was diagnosed with leukemia at age 30, an uncle also with leukemia at age 51, another uncle at age 54 with colon cancer, and a cousin with breast cancer at age 43. On the mother's side there was an uncle with cancer of some type, not known to the patient, diagnosed age 70   GYNECOLOGIC HISTORY:  No LMP recorded.  Menarche age 42. She is GX P2, first live birth age 66. As of April 2018 she is having regular periods. She never used oral contraceptives.    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 Son [name] is 25, daughter cavity is 78, as of April 2018.     ADVANCED DIRECTIVES: Not in place    HEALTH MAINTENANCE: Social  History  Substance Use Topics  . Smoking status: Former Smoker    Quit date: 02/01/2017  . Smokeless tobacco: Never Used  . Alcohol use Yes     Colonoscopy: n/a  PAP:  Bone density:   No Known Allergies  Current Outpatient Prescriptions  Medication Sig Dispense Refill  . acetaminophen (TYLENOL) 325 MG tablet Take 650 mg by mouth as needed.    Marland Kitchen dexamethasone (DECADRON) 4 MG tablet Take 2 tablets (8 mg total) by mouth 2 (two) times daily. Start the day before Taxotere. Then again the day after chemo for 3 days. 30 tablet 1  . diphenhydrAMINE (BENADRYL) 25 mg capsule Take 25 mg by mouth as needed.    . lidocaine-prilocaine (EMLA) cream Apply to affected area once 30 g 3  . LORazepam (ATIVAN) 0.5 MG tablet Take 1 tablet (0.5 mg total) by mouth every 6 (six) hours as needed (Nausea or vomiting). 30 tablet 0  . prochlorperazine (COMPAZINE) 10 MG tablet Take 1 tablet (10 mg total) by mouth every 6 (six) hours as needed (Nausea or vomiting). 30 tablet 1   No current facility-administered medications for this visit.     OBJECTIVE: [name]  In no acute distress Vitals:   02/24/17 1547  BP: 128/78  Pulse: 95  Resp: 20  Temp: 98.1 F (36.7 C)     Body mass index is 23.5 kg/m.    ECOG FS:1 - Symptomatic but completely ambulatory  Sclerae unicteric, EOMs intact Oropharynx clear and moist No cervical or supraclavicular adenopathy Lungs no rales or rhonchi Heart regular rate and rhythm Abd soft, nontender, positive bowel sounds MSK no focal spinal tenderness, no upper extremity lymphedema Neuro: nonfocal, well oriented, appropriate affect Breasts: I had some difficulty palpating the mass in the Right breast today. The left breast was unremarkable. Both axillae were beningn  LAB RESULTS:  CMP     Component Value Date/Time   NA 142 02/16/2017 0927   K 3.9 02/16/2017 0927   CO2 27 02/16/2017 0927   GLUCOSE 146 (H) 02/16/2017 0927   BUN 10.1 02/16/2017 0927    CREATININE 0.8 02/16/2017 0927   CALCIUM 10.3 02/16/2017 0927   PROT 8.2 02/16/2017 0927   ALBUMIN 4.4 02/16/2017 0927   AST 12 02/16/2017 0927   ALT 11 02/16/2017 0927   ALKPHOS 50 02/16/2017 0927   BILITOT 0.49 02/16/2017 0927    No results found for: TOTALPROTELP, ALBUMINELP, A1GS, A2GS, BETS, BETA2SER, GAMS, MSPIKE, SPEI  No results found for: Nils Pyle, [facility]  Lab Results  Component Value Date   WBC 13.6 (H) 02/16/2017   NEUTROABS 11.4 (H) 02/16/2017   HGB 12.2 02/16/2017   HCT 35.9 02/16/2017   MCV 76.5 (L) 02/16/2017   PLT 379 02/16/2017      Chemistry      Component Value Date/Time   NA 142 02/16/2017 0927   K 3.9 02/16/2017 0927   CO2 27 02/16/2017 0927   BUN 10.1 02/16/2017 0927   CREATININE 0.8 02/16/2017 0927      Component Value Date/Time   CALCIUM 10.3 02/16/2017  0927   ALKPHOS 50 02/16/2017 0927   AST 12 02/16/2017 0927   ALT 11 02/16/2017 0927   BILITOT 0.49 02/16/2017 0927       No results found for: LABCA2  No components found for: [id number]  No results for input(s): INR in the last 168 hours.  Urinalysis    Component Value Date/Time   COLORURINE yellow 01/22/2010 1457   APPEARANCEUR Clear 01/22/2010 1457   LABSPEC 1.015 01/22/2010 1457   PHURINE 5.0 01/22/2010 1457   GLUCOSEU NEGATIVE 05/24/2007 1404   HGBUR trace-lysed 01/22/2010 1457   BILIRUBINUR negative 01/22/2010 [place] 05/24/2007 1404   PROTEINUR NEGATIVE 05/24/2007 1404   UROBILINOGEN 0.2 01/22/2010 1457   NITRITE negative 01/22/2010 1457   LEUKOCYTESUR  05/24/2007 1404    NEGATIVE MICROSCOPIC NOT DONE ON URINES WITH NEGATIVE PROTEIN, BLOOD, LEUKOCYTES, NITRITE, OR GLUCOSE <1000 mg/dL.     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 LABS:  None performed today EXAM: BILATERAL BREAST MRI WITH AND WITHOUT  CONTRAST TECHNIQUE: Multiplanar, multisequence MR images of both breasts were obtained prior to and following the intravenous administration of 15 ml of MultiHance. THREE-DIMENSIONAL MR IMAGE RENDERING ON INDEPENDENT WORKSTATION: Three-dimensional MR images were rendered by post-processing of the original MR data on an independent workstation. The three-dimensional MR images were interpreted, and findings are reported in the following complete MRI report for this study. Three dimensional images were evaluated at the independent DynaCad workstation COMPARISON:  Prior mammograms and ultrasound FINDINGS: Breast composition: c. Heterogeneous fibroglandular tissue. Background parenchymal enhancement: Moderate. Right breast: 3 adjacent irregular masses within the upper and central right breast are identified as follows: A 1.5 x 1.6 x 1.5 cm upper retroareolar right breast mass A 3.1 x 1.6 x 1.3 cm mass in the upper right breast (anterior third) with biopsy clip artifact A 1.8 x 1.8 x 1.6 cm mass in the inner central right breast These 3 masses encompasses an area measuring 5.1 x 1.8 x 3.1 cm. A slightly irregular 1.2 x 0.9 x 1.2 cm enhancing mass within the lower outer right breast (at junction of the middle and posterior thirds) is noted. This mass lies 3 cm posterior and inferior to the 1.8 cm central right breast mass described above. Left breast: No mass or abnormal enhancement. Lymph nodes: No abnormal appearing lymph nodes. Ancillary findings:  None. IMPRESSION: Three adjacent irregular masses within the upper/ central right breast encompassing an area measuring 5.1 x 1.8 x 3.1 cm. The upper right breast mass (anterior third) has biopsy clip artifact and represents biopsy-proven malignancy. Suspicious 1.2 cm mass within the lower outer right breast. Recommend second-look right breast ultrasound and biopsy if breast conservation is desired. No MR evidence of left breast malignancy or abnormal lymph nodes.  RECOMMENDATION: Second-look right breast ultrasound and biopsy of 1.2 cm lower outer right breast mass if breast conservation is desired. Treatment plan otherwise. BI-RADS CATEGORY  6: Known biopsy-proven malignancy. Electronically Signed   By: Margarette Canada M.D.   On: 02/14/2017 10:44   US Breast Ltd Uni Right Inc Axilla  Result Date: 01/30/2017 CLINICAL DATA:  46 year old female with palpable abnormality in the right breast at 12 o'clock for 3-4 months. EXAM: 2D DIGITAL DIAGNOSTIC BILATERAL MAMMOGRAM WITH CAD AND ADJUNCT TOMO ULTRASOUND RIGHT BREAST COMPARISON:  Previous exam(s). ACR Breast Density Category c: The breast tissue is heterogeneously dense, which  may obscure small masses. FINDINGS: There is an irregular mass within the anterior, superior right breast underlying the palpable marker which spans approximately 5 cm. No suspicious mass, calcifications, or other abnormality is identified within the left breast. Mammographic images were processed with CAD. On physical exam, there is a firm palpable mass at 12 o'clock, 2 cm from the nipple, corresponding to the patient's area of concern. Targeted ultrasound is performed demonstrating an irregular mixed echogenicity area with posterior acoustic shadowing centered at 12 o'clock, 2 cm from the nipple spanning at least 4.6 x 3.4 x 2.7 cm. There is a similar-appearing adjacent area at 1 o'clock, 2 cm from the nipple measuring 1.3 x 1 x 1 cm which is thought to be contiguous with the area at 12 o'clock. Targeted ultrasound of the right axilla demonstrates a right axillary lymph node with cortical thickening measuring up to 4 mm. IMPRESSION: 1. Suspicious right breast mass centered at 12 o'clock, 2 cm from the nipple. 2. Indeterminate right axillary lymph node. RECOMMENDATION: 1. Ultrasound-guided biopsy of the right breast mass at 12 o'clock, 2 cm from the nipple. If biopsy results demonstrate malignancy, breast MRI is recommended for further evaluation of extent  of disease. 2. Ultrasound-guided biopsy of the indeterminate right axillary lymph node. I have discussed the findings and recommendations with the patient. Results were also provided in writing at the conclusion of the visit. If applicable, a reminder letter will be sent to the patient regarding the next appointment. BI-RADS CATEGORY  5: Highly suggestive of malignancy. Electronically Signed   By: Pamelia Hoit M.D.   On: 01/30/2017 17:07   Mm Diag Breast Tomo Bilateral  Result Date: 01/30/2017 CLINICAL DATA:  46 year old female with palpable abnormality in the right breast at 12 o'clock for 3-4 months. EXAM: 2D DIGITAL DIAGNOSTIC BILATERAL MAMMOGRAM WITH CAD AND ADJUNCT TOMO ULTRASOUND RIGHT BREAST COMPARISON:  Previous exam(s). ACR Breast Density Category c: The breast tissue is heterogeneously dense, which may obscure small masses. FINDINGS: There is an irregular mass within the anterior, superior right breast underlying the palpable marker which spans approximately 5 cm. No suspicious mass, calcifications, or other abnormality is identified within the left breast. Mammographic images were processed with CAD. On physical exam, there is a firm palpable mass at 12 o'clock, 2 cm from the nipple, corresponding to the patient's area of concern. Targeted ultrasound is performed demonstrating an irregular mixed echogenicity area with posterior acoustic shadowing centered at 12 o'clock, 2 cm from the nipple spanning at least 4.6 x 3.4 x 2.7 cm. There is a similar-appearing adjacent area at 1 o'clock, 2 cm from the nipple measuring 1.3 x 1 x 1 cm which is thought to be contiguous with the area at 12 o'clock. Targeted ultrasound of the right axilla demonstrates a right axillary lymph node with cortical thickening measuring up to 4 mm. IMPRESSION: 1. Suspicious right breast mass centered at 12 o'clock, 2 cm from the nipple. 2. Indeterminate right axillary lymph node. RECOMMENDATION: 1. Ultrasound-guided biopsy of the right  breast mass at 12 o'clock, 2 cm from the nipple. If biopsy results demonstrate malignancy, breast MRI is recommended for further evaluation of extent of disease. 2. Ultrasound-guided biopsy of the indeterminate right axillary lymph node. I have discussed the findings and recommendations with the patient. Results were also provided in writing at the conclusion of the visit. If applicable, a reminder letter will be sent to the patient regarding the next appointment. BI-RADS CATEGORY  5: Highly suggestive of malignancy. Electronically  Signed   By: Pamelia Hoit M.D.   On: 01/30/2017 17:07   [place] Axillary Node Core Biopsy Right  Addendum Date: 02/03/2017   ADDENDUM REPORT: 02/02/2017 15:30 ADDENDUM: Pathology revealed GRADE II INVASIVE DUCTAL CARCINOMA, DUCTAL CARCINOMA IN SITU of the Right breast, 12:00. REACTIVE LYMPH NODE of the Right axilla. This was found to be concordant by Dr. Curlene Dolphin. Pathology results were discussed with the patient by telephone. The patient reported doing well after the biopsies with tenderness at the sites. Post biopsy instructions and care were reviewed and questions were answered. The patient was encouraged to call The [facility] for any additional concerns. The patient was referred to [facility] on February 08, 2017. Recommendation for bilateral breast MRI for further evaluation of extent of disease. Pathology results reported by Terie Purser, RN on 02/01/2017. Electronically Signed   By: Curlene Dolphin M.D.   On: 02/02/2017 15:30   Result Date: 02/03/2017 CLINICAL DATA:  Right axillary lymph node with mild cortical thickening was seen at the time of evaluation of a suspicious palpable mass in the right breast. Axillary lymph node biopsy was recommended. EXAM: ULTRASOUND GUIDED CORE NEEDLE BIOPSY OF A RIGHT AXILLARY NODE COMPARISON:  Previous exam(s). FINDINGS: I met with the patient and  we discussed the procedure of ultrasound-guided biopsy, including benefits and alternatives. We discussed the high likelihood of a successful procedure. We discussed the risks of the procedure, including infection, bleeding, tissue injury, clip migration, and inadequate sampling. Informed written consent was given. The usual time-out protocol was performed immediately prior to the procedure. Using sterile technique and 1% Lidocaine as local anesthetic, under direct ultrasound visualization, a 14 gauge spring-loaded device was used to perform biopsy of a lymph node with cortical thickening in the right axilla using a lateral to medial approach. At the conclusion of the procedure a spiral HydroMARK tissue marker clip was deployed into the biopsy cavity. Follow up 2 view mammogram was performed and dictated separately. IMPRESSION: Ultrasound guided biopsy of right axillary lymph node. No apparent complications. Electronically Signed: By: Curlene Dolphin M.D. On: 01/31/2017 16:51   Mm Clip Placement Right  Result Date: 02/17/2017 CLINICAL DATA:  A second look ultrasound of the right breast was performed today to evaluate in the suspicious 1 cm enhancing mass in the lower outer quadrant of the right breast identified on recent breast MRI. The mass was detected on ultrasound and ultrasound-guided biopsy was performed today and dictated separately. The patient has a recent diagnosis of right breast cancer, following ultrasound-guided biopsy of an irregular palpable mass in the upper central right breast. The patient began chemotherapy yesterday. EXAM: DIAGNOSTIC RIGHT MAMMOGRAM POST ULTRASOUND BIOPSY COMPARISON:  Previous exam(s). FINDINGS: Mammographic images were obtained following ultrasound guided biopsy of a hypoechoic irregular mass in the 8:30 position of the right breast 5-6 cm from the nipple. A coil shaped biopsy clip is in the expected region of the biopsied mass. The mass is not well visualized  mammographically. The coil shaped biopsy clip placed today is approximately 6 cm from the ribbon shaped biopsy clip previously placed, as measured on the CC view. On the 90 degree lateral view, the coil shaped biopsy clip is 3.5 cm posterior to the ribbon shaped biopsy clip previously placed. IMPRESSION: Satisfactory position of a coil shaped biopsy clip in the outer right breast. Final Assessment: Post Procedure Mammograms for Marker Placement Electronically Signed   By:  Curlene Dolphin M.D.   On: 02/17/2017 16:48   Mm Clip Placement Right  Result Date: 01/31/2017 CLINICAL DATA:  Suspicious mass in the right breast 12 o'clock position was biopsied under ultrasound guidance. Additionally, a single right axillary lymph node with mild cortical thickening was biopsied. EXAM: DIAGNOSTIC RIGHT MAMMOGRAM POST ULTRASOUND BIOPSY COMPARISON:  Previous exam(s). FINDINGS: Mammographic images were obtained following ultrasound guided biopsy of a right breast mass at 12 o'clock position and of a right axillary lymph node. A ribbon shaped biopsy clip is satisfactorily positioned within the palpable mass at 12 o'clock position in the right breast. The biopsied right axillary lymph node is not included in the imaging field. IMPRESSION: Satisfactory position of ribbon shaped biopsy clip in the right breast mass. Final Assessment: Post Procedure Mammograms for Marker Placement Electronically Signed   By: Curlene Dolphin M.D.   On: 01/31/2017 16:56   [place] Rt Breast Bx W Loc Dev 1st Lesion Img Bx Spec US Guide  Result Date: 02/17/2017 CLINICAL DATA:  The patient presents for a second look ultrasound of the right breast. She had a bilateral breast MRI performed February 14 2016. A 1.2 x 0.9 x 1.2 cm enhancing mass was noted in the lower outer right breast near the junction of the middle and posterior thirds. The patient has a recently diagnosed right breast cancer following biopsy of a palpable mass in the upper central right breast,  periareolar. On evaluation today, I do detect an approximately 1.0 cm hypoechoic slightly irregular mass in the 8:30 position of the right breast 5-6 cm from the nipple. There is some vascular flow within the mass. The mass will be biopsied today. EXAM: ULTRASOUND GUIDED RIGHT BREAST CORE NEEDLE BIOPSY COMPARISON:  Previous exam(s). FINDINGS: I met with the patient and we discussed the procedure of ultrasound-guided biopsy, including benefits and alternatives. We discussed the high likelihood of a successful procedure. We discussed the risks of the procedure, including infection, bleeding, tissue injury, clip migration, and inadequate sampling. Informed written consent was given. The usual time-out protocol was performed immediately prior to the procedure. Lesion quadrant: Lower outer quadrant Using sterile technique and 1% Lidocaine as local anesthetic, under direct ultrasound visualization, a 12 gauge spring-loaded device was used to perform biopsy of an approximate 1.0 cm hypoechoic irregular mass at 8:30 position 5-6 cm from the nipple using a lateral to medial approach. At the conclusion of the procedure a coil shaped tissue marker clip was deployed into the biopsy cavity. Follow up 2 view mammogram was performed and dictated separately. IMPRESSION: Ultrasound guided biopsy of an approximately 1.0 cm mass detected on ultrasound in the 8:30 position of the right breast 5-6 cm from the nipple. This mass does correlate with the suspicious mass seen on recent breast MRI. No apparent complications. Electronically Signed   By: Curlene Dolphin M.D.   On: 02/17/2017 17:19   [place] Rt Breast Bx W Loc Dev 1st Lesion Img Bx Spec US Guide  Addendum Date: 02/03/2017   ADDENDUM REPORT: 02/02/2017 15:31 ADDENDUM: Pathology revealed GRADE II INVASIVE DUCTAL CARCINOMA, DUCTAL CARCINOMA IN SITU of the Right breast, 12:00. REACTIVE LYMPH NODE of the Right axilla. This was found to be concordant by Dr. Curlene Dolphin. Pathology  results were discussed with the patient by telephone. The patient reported doing well after the biopsies with tenderness at the sites. Post biopsy instructions and care were reviewed and questions were answered. The patient was encouraged to call [facility]  for any additional concerns. The patient was referred to [facility] at [facility] on February 08, 2017. Recommendation for bilateral breast MRI for further evaluation of extent of disease. Pathology results reported by Terie Purser, RN on 02/01/2017. Electronically Signed   By: Curlene Dolphin M.D.   On: 02/02/2017 15:31   Result Date: 02/03/2017 CLINICAL DATA:  Ultrasound-guided biopsy was recommended of a suspicious palpable mass spanning the 12 to 1 o'clock position of the right breast. EXAM: ULTRASOUND GUIDED RIGHT BREAST CORE NEEDLE BIOPSY COMPARISON:  Previous exam(s). FINDINGS: I met with the patient and we discussed the procedure of ultrasound-guided biopsy, including benefits and alternatives. We discussed the high likelihood of a successful procedure. We discussed the risks of the procedure, including infection, bleeding, tissue injury, clip migration, and inadequate sampling. Informed written consent was given. The usual time-out protocol was performed immediately prior to the procedure. Using sterile technique and 1% Lidocaine as local anesthetic, under direct ultrasound visualization, a 12 gauge spring-loaded device was used to perform biopsy of a suspicious palpable mass in the 12-1 o'clock position of the right breast using a lateral to medial approach. At the conclusion of the procedure a ribbon shaped tissue marker clip was deployed into the biopsy cavity. Follow up 2 view mammogram was performed and dictated separately. IMPRESSION: Ultrasound guided biopsy of right breast mass. No apparent complications. Electronically Signed: By: Curlene Dolphin M.D. On:  01/31/2017 16:49    ELIGIBLE FOR AVAILABLE RESEARCH PROTOCOL:   ASSESSMENT: 46 y.o.  [place] woman status post right breast biopsy 02/01/2017 for a cT2 pN0, stage IB invasive ductal carcinoma, grade 2, estrogen and progesterone receptor positive, with an MIB-1 of 25%, and HER-2 amplified   (a) biopsy of a suspicious right axillary lymph node 01/24/2017 showed only reactive lymphoid tissue  (b) biopsy of right breast lower outer quadrant 02/17/2017 showed invasive ductal carcinoma, grade 3, estrogen and progesterone receptor positive, HER-2 not amplified, with an Mib-1 of 50%  (1) genetics testing pending  (2) neoadjuvant chemotherapy will consist of carboplatin, docetaxel, and trastuzumab with pertuzumab given every 3 weeks 6, starting 02/16/2017   (3) anti-HER-2 immunotherapy to continue to total 1 year  (4) definitive surgery to follow at the completion of chemotherapy  (5) adjuvant radiation as appropriate  (6) anti-estrogens to follow the completion of local treatment   PLAN: Solace did remarkably well with her first cycle of chemotherapy. 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 We discussed alternative analgesics but in any case she will start stool softeners on chemo day and use any laxative day 3 if she has not had a bowel movement by then.   We reviewed the images of her breast by MRI and she could appreciate the size of the primary tumor and the smaller tumor in a separate quadrant. 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 Both tumors however will benefit from anti-estrogens once she completes her local treatment.  She understands she will likely lose her hair sometime next week. She has a wig all ready.  She will see my APP 05/02 for cycle 2; I will see her the following week to trobleshoot problems  as we did today.  Chauncey Cruel, MD   02/24/2017 3:49 PM Medical Oncology and Hematology [facility]  [place], [place] [address] Tel. [phone]    Fax. [phone]

## 2017-02-27 ENCOUNTER — Telehealth: Payer: Self-pay | Admitting: Oncology

## 2017-02-27 NOTE — Telephone Encounter (Signed)
Faxed completed Disability forms to [place] fax [phone] on 02/22/17. Scanned copy of the forms in Epic

## 2017-02-28 ENCOUNTER — Other Ambulatory Visit: Payer: 59

## 2017-02-28 ENCOUNTER — Encounter: Payer: 59 | Admitting: Genetics

## 2017-03-08 ENCOUNTER — Other Ambulatory Visit (HOSPITAL_BASED_OUTPATIENT_CLINIC_OR_DEPARTMENT_OTHER): Payer: 59

## 2017-03-08 ENCOUNTER — Other Ambulatory Visit (HOSPITAL_COMMUNITY)
Admission: AD | Admit: 2017-03-08 | Discharge: 2017-03-08 | Disposition: A | Discharge status: Home / Self Care | Payer: 59 | Source: Ambulatory Visit | Attending: Oncology | Admitting: Oncology

## 2017-03-08 ENCOUNTER — Encounter: Payer: Self-pay | Admitting: Pharmacist

## 2017-03-08 DIAGNOSIS — C50411 Malignant neoplasm of upper-outer quadrant of right female breast: Secondary | ICD-10-CM | POA: Diagnosis present

## 2017-03-08 DIAGNOSIS — Z170 Estrogen receptor positive status [ER+]: Principal | ICD-10-CM

## 2017-03-08 DIAGNOSIS — C50511 Malignant neoplasm of lower-outer quadrant of right female breast: Secondary | ICD-10-CM

## 2017-03-08 LAB — CBC WITH DIFFERENTIAL/PLATELET
BASO%: 0.5 % (ref 0.0–2.0)
Basophils Absolute: 0.1 10*3/uL (ref 0.0–0.1)
EOS ABS: 0.1 10*3/uL (ref 0.0–0.5)
EOS%: 1 % (ref 0.0–7.0)
HCT: 30.4 % — ABNORMAL LOW (ref 34.8–46.6)
HGB: 10.4 g/dL — ABNORMAL LOW (ref 11.6–15.9)
LYMPH%: 17.4 % (ref 14.0–49.7)
MCH: 25.8 pg (ref 25.1–34.0)
MCHC: 34.2 g/dL (ref 31.5–36.0)
MCV: 75.6 fL — AB (ref 79.5–101.0)
MONO#: 1.7 10*3/uL — ABNORMAL HIGH (ref 0.1–0.9)
MONO%: 12.6 % (ref 0.0–14.0)
NEUT%: 68.5 % (ref 38.4–76.8)
NEUTROS ABS: 9 10*3/uL — AB (ref 1.5–6.5)
Platelets: 410 10*3/uL — ABNORMAL HIGH (ref 145–400)
RBC: 4.02 10*6/uL (ref 3.70–5.45)
RDW: 15.5 % — ABNORMAL HIGH (ref 11.2–14.5)
WBC: 13.2 10*3/uL — AB (ref 3.9–10.3)
lymph#: 2.3 10*3/uL (ref 0.9–3.3)

## 2017-03-08 LAB — COMPREHENSIVE METABOLIC PANEL
ALT: 87 U/L — ABNORMAL HIGH (ref 0–55)
AST: 25 U/L (ref 5–34)
Albumin: 4 g/dL (ref 3.5–5.0)
Alkaline Phosphatase: 86 U/L (ref 40–150)
Anion Gap: 11 mEq/L (ref 3–11)
BILIRUBIN TOTAL: 0.38 mg/dL (ref 0.20–1.20)
BUN: 6.6 mg/dL — ABNORMAL LOW (ref 7.0–26.0)
CHLORIDE: 103 meq/L (ref 98–109)
CO2: 28 mEq/L (ref 22–29)
Calcium: 10.1 mg/dL (ref 8.4–10.4)
Creatinine: 0.9 mg/dL (ref 0.6–1.1)
GLUCOSE: 100 mg/dL (ref 70–140)
Potassium: 4.1 mEq/L (ref 3.5–5.1)
SODIUM: 142 meq/L (ref 136–145)
TOTAL PROTEIN: 7.9 g/dL (ref 6.4–8.3)

## 2017-03-08 LAB — PREGNANCY, URINE: Preg Test, Ur: NEGATIVE

## 2017-03-09 ENCOUNTER — Telehealth: Payer: Self-pay

## 2017-03-09 ENCOUNTER — Ambulatory Visit (HOSPITAL_BASED_OUTPATIENT_CLINIC_OR_DEPARTMENT_OTHER): Payer: 59

## 2017-03-09 ENCOUNTER — Other Ambulatory Visit: Payer: 59

## 2017-03-09 ENCOUNTER — Encounter: Payer: Self-pay | Admitting: Genetics

## 2017-03-09 ENCOUNTER — Encounter: Payer: Self-pay | Admitting: *Deleted

## 2017-03-09 ENCOUNTER — Ambulatory Visit (HOSPITAL_BASED_OUTPATIENT_CLINIC_OR_DEPARTMENT_OTHER): Payer: 59 | Admitting: Adult Health

## 2017-03-09 ENCOUNTER — Ambulatory Visit (HOSPITAL_BASED_OUTPATIENT_CLINIC_OR_DEPARTMENT_OTHER): Payer: 59 | Admitting: Genetics

## 2017-03-09 ENCOUNTER — Ambulatory Visit: Payer: 59 | Admitting: Oncology

## 2017-03-09 ENCOUNTER — Encounter: Payer: Self-pay | Admitting: Adult Health

## 2017-03-09 VITALS — BP 122/80 | HR 89 | Temp 97.5°F | Resp 18 | Ht 66.0 in | Wt 148.9 lb

## 2017-03-09 DIAGNOSIS — C50411 Malignant neoplasm of upper-outer quadrant of right female breast: Secondary | ICD-10-CM

## 2017-03-09 DIAGNOSIS — Z5111 Encounter for antineoplastic chemotherapy: Secondary | ICD-10-CM | POA: Diagnosis not present

## 2017-03-09 DIAGNOSIS — Z5112 Encounter for antineoplastic immunotherapy: Secondary | ICD-10-CM

## 2017-03-09 DIAGNOSIS — C50511 Malignant neoplasm of lower-outer quadrant of right female breast: Secondary | ICD-10-CM

## 2017-03-09 DIAGNOSIS — C50911 Malignant neoplasm of unspecified site of right female breast: Secondary | ICD-10-CM

## 2017-03-09 DIAGNOSIS — Z170 Estrogen receptor positive status [ER+]: Secondary | ICD-10-CM | POA: Diagnosis not present

## 2017-03-09 MED ORDER — DIPHENHYDRAMINE HCL 25 MG PO CAPS
25.0000 mg | ORAL_CAPSULE | Freq: Once | ORAL | Status: AC
  Administered 2017-03-09: 25 mg via ORAL

## 2017-03-09 MED ORDER — ACETAMINOPHEN 325 MG PO TABS
ORAL_TABLET | ORAL | Status: AC
Start: 2017-03-09 — End: ?
  Filled 2017-03-09: qty 2

## 2017-03-09 MED ORDER — DOCETAXEL CHEMO INJECTION 160 MG/16ML
60.0000 mg/m2 | Freq: Once | INTRAVENOUS | Status: AC
  Administered 2017-03-09: 110 mg via INTRAVENOUS
  Filled 2017-03-09: qty 11

## 2017-03-09 MED ORDER — TRASTUZUMAB CHEMO 150 MG IV SOLR
6.0000 mg/kg | Freq: Once | INTRAVENOUS | Status: AC
  Administered 2017-03-09: 399 mg via INTRAVENOUS
  Filled 2017-03-09: qty 19

## 2017-03-09 MED ORDER — ACETAMINOPHEN 325 MG PO TABS
650.0000 mg | ORAL_TABLET | Freq: Once | ORAL | Status: AC
  Administered 2017-03-09: 650 mg via ORAL

## 2017-03-09 MED ORDER — DEXAMETHASONE SODIUM PHOSPHATE 10 MG/ML IJ SOLN
INTRAMUSCULAR | Status: AC
  Filled 2017-03-09: qty 1

## 2017-03-09 MED ORDER — PALONOSETRON HCL INJECTION 0.25 MG/5ML
0.2500 mg | Freq: Once | INTRAVENOUS | Status: AC
  Administered 2017-03-09: 0.25 mg via INTRAVENOUS

## 2017-03-09 MED ORDER — PALONOSETRON HCL INJECTION 0.25 MG/5ML
INTRAVENOUS | Status: AC
  Filled 2017-03-09: qty 5

## 2017-03-09 MED ORDER — DEXAMETHASONE SODIUM PHOSPHATE 10 MG/ML IJ SOLN
10.0000 mg | Freq: Once | INTRAMUSCULAR | Status: AC
  Administered 2017-03-09: 10 mg via INTRAVENOUS

## 2017-03-09 MED ORDER — PERTUZUMAB CHEMO INJECTION 420 MG/14ML
420.0000 mg | Freq: Once | INTRAVENOUS | Status: AC
  Administered 2017-03-09: 420 mg via INTRAVENOUS
  Filled 2017-03-09: qty 14

## 2017-03-09 MED ORDER — SODIUM CHLORIDE 0.9 % IV SOLN
Freq: Once | INTRAVENOUS | Status: AC
  Administered 2017-03-09: 13:00:00 via INTRAVENOUS

## 2017-03-09 MED ORDER — DIPHENHYDRAMINE HCL 25 MG PO CAPS
ORAL_CAPSULE | ORAL | Status: AC
  Filled 2017-03-09: qty 1

## 2017-03-09 MED ORDER — SODIUM CHLORIDE 0.9% FLUSH
10.0000 mL | INTRAVENOUS | Status: DC | PRN
  Filled 2017-03-09: qty 10

## 2017-03-09 MED ORDER — CARBOPLATIN CHEMO INJECTION 600 MG/60ML
553.0000 mg | Freq: Once | INTRAVENOUS | Status: AC
  Administered 2017-03-09: 550 mg via INTRAVENOUS
  Filled 2017-03-09: qty 55

## 2017-03-09 MED ORDER — HEPARIN SOD (PORK) LOCK FLUSH 100 UNIT/ML IV SOLN
500.0000 [IU] | Freq: Once | INTRAVENOUS | Status: DC | PRN
  Filled 2017-03-09: qty 5

## 2017-03-09 NOTE — Progress Notes (Signed)
Per Dr Jana Hakim, [place] to treat today with ALT of 87.  Dr Jana Hakim to reduce dose of Taxotere

## 2017-03-09 NOTE — Patient Instructions (Signed)
[facility] Discharge Instructions for Patients Receiving Chemotherapy  Today you received the following chemotherapy agents: Herceptin, Perjeta, Taxotere and Carboplatin   To help prevent nausea and vomiting after your treatment, we encourage you to take your nausea medication as directed.    If you develop nausea and vomiting that is not controlled by your nausea medication, call the clinic.   BELOW ARE SYMPTOMS THAT SHOULD BE REPORTED IMMEDIATELY:  *FEVER GREATER THAN 100.5 F  *CHILLS WITH OR WITHOUT FEVER  NAUSEA AND VOMITING THAT IS NOT CONTROLLED WITH YOUR NAUSEA MEDICATION  *UNUSUAL SHORTNESS OF BREATH  *UNUSUAL BRUISING OR BLEEDING  TENDERNESS IN MOUTH AND THROAT WITH OR WITHOUT PRESENCE OF ULCERS  *URINARY PROBLEMS  *BOWEL PROBLEMS  UNUSUAL RASH Items with * indicate a potential emergency and should be followed up as soon as possible.  Feel free to call the clinic you have any questions or concerns. The clinic phone number is [phone].  Please show the [place] at check-in to the Emergency Department and triage nurse.  Trastuzumab injection for infusion What is this medicine? TRASTUZUMAB (tras TOO zoo mab) is a monoclonal antibody. It is used to treat breast cancer and stomach cancer. This medicine may be used for other purposes; ask your health care provider or pharmacist if you have questions. COMMON BRAND NAME(S): Herceptin What should I tell my health care provider before I take this medicine? They need to know if you have any of these conditions: -heart disease -heart failure -lung or breathing disease, like asthma -an unusual or allergic reaction to trastuzumab, benzyl alcohol, or other medications, foods, dyes, or preservatives -pregnant or trying to get pregnant -breast-feeding How should I use this medicine? This drug is given as an infusion into a vein. It is administered in a hospital or clinic by a specially trained  health care professional. Talk to your pediatrician regarding the use of this medicine in children. This medicine is not approved for use in children. Overdosage: If you think you have taken too much of this medicine contact a poison control center or emergency room at once. NOTE: This medicine is only for you. Do not share this medicine with others. What if I miss a dose? It is important not to miss a dose. Call your doctor or health care professional if you are unable to keep an appointment. What may interact with this medicine? This medicine may interact with the following medications: -certain types of chemotherapy, such as daunorubicin, doxorubicin, epirubicin, and idarubicin This list may not describe all possible interactions. Give your health care provider a list of all the medicines, herbs, non-prescription drugs, or dietary supplements you use. Also tell them if you smoke, drink alcohol, or use illegal drugs. Some items may interact with your medicine. What should I watch for while using this medicine? Visit your doctor for checks on your progress. Report any side effects. Continue your course of treatment even though you feel ill unless your doctor tells you to stop. Call your doctor or health care professional for advice if you get a fever, chills or sore throat, or other symptoms of a cold or flu. Do not treat yourself. Try to avoid being around people who are sick. You may experience fever, chills and shaking during your first infusion. These effects are usually mild and can be treated with other medicines. Report any side effects during the infusion to your health care professional. Fever and chills usually do not happen with later infusions.  Do not become pregnant while taking this medicine or for 7 months after stopping it. Women should inform their doctor if they wish to become pregnant or think they might be pregnant. Women of child-bearing potential will need to have a negative  pregnancy test before starting this medicine. There is a potential for serious side effects to an unborn child. Talk to your health care professional or pharmacist for more information. Do not breast-feed an infant while taking this medicine or for 7 months after stopping it. Women must use effective birth control with this medicine. What side effects may I notice from receiving this medicine? Side effects that you should report to your doctor or health care professional as soon as possible: -allergic reactions like skin rash, itching or hives, swelling of the face, lips, or tongue -chest pain or palpitations -cough -dizziness -feeling faint or lightheaded, falls -fever -general ill feeling or flu-like symptoms -signs of worsening heart failure like breathing problems; swelling in your legs and feet -unusually weak or tired Side effects that usually do not require medical attention (report to your doctor or health care professional if they continue or are bothersome): -bone pain -changes in taste -diarrhea -joint pain -nausea/vomiting -weight loss This list may not describe all possible side effects. Call your doctor for medical advice about side effects. You may report side effects to FDA at 1-800-FDA-1088. Where should I keep my medicine? This drug is given in a hospital or clinic and will not be stored at home. NOTE: This sheet is a summary. It may not cover all possible information. If you have questions about this medicine, talk to your doctor, pharmacist, or health care provider.  2018 Elsevier/Gold Standard (2016-10-18 14:37:52)   Pertuzumab injection What is this medicine? PERTUZUMAB (per TOOZ ue mab) is a monoclonal antibody. It is used to treat breast cancer. This medicine may be used for other purposes; ask your health care provider or pharmacist if you have questions. COMMON BRAND NAME(S): PERJETA What should I tell my health care provider before I take this  medicine? They need to know if you have any of these conditions: -heart disease -heart failure -high blood pressure -history of irregular heart beat -recent or ongoing radiation therapy -an unusual or allergic reaction to pertuzumab, other medicines, foods, dyes, or preservatives -pregnant or trying to get pregnant -breast-feeding How should I use this medicine? This medicine is for infusion into a vein. It is given by a health care professional in a hospital or clinic setting. Talk to your pediatrician regarding the use of this medicine in children. Special care may be needed. Overdosage: If you think you have taken too much of this medicine contact a poison control center or emergency room at once. NOTE: This medicine is only for you. Do not share this medicine with others. What if I miss a dose? It is important not to miss your dose. Call your doctor or health care professional if you are unable to keep an appointment. What may interact with this medicine? Interactions are not expected. Give your health care provider a list of all the medicines, herbs, non-prescription drugs, or dietary supplements you use. Also tell them if you smoke, drink alcohol, or use illegal drugs. Some items may interact with your medicine. This list may not describe all possible interactions. Give your health care provider a list of all the medicines, herbs, non-prescription drugs, or dietary supplements you use. Also tell them if you smoke, drink alcohol, or use illegal drugs. Some  items may interact with your medicine. What should I watch for while using this medicine? Your condition will be monitored carefully while you are receiving this medicine. Report any side effects. Continue your course of treatment even though you feel ill unless your doctor tells you to stop. Do not become pregnant while taking this medicine or for 7 months after stopping it. Women should inform their doctor if they wish to become  pregnant or think they might be pregnant. Women of child-bearing potential will need to have a negative pregnancy test before starting this medicine. There is a potential for serious side effects to an unborn child. Talk to your health care professional or pharmacist for more information. Do not breast-feed an infant while taking this medicine or for 7 months after stopping it. Women must use effective birth control with this medicine. Call your doctor or health care professional for advice if you get a fever, chills or sore throat, or other symptoms of a cold or flu. Do not treat yourself. Try to avoid being around people who are sick. You may experience fever, chills, and headache during the infusion. Report any side effects during the infusion to your health care professional. What side effects may I notice from receiving this medicine? Side effects that you should report to your doctor or health care professional as soon as possible: -breathing problems -chest pain or palpitations -dizziness -feeling faint or lightheaded -fever or chills -skin rash, itching or hives -sore throat -swelling of the face, lips, or tongue -swelling of the legs or ankles -unusually weak or tired Side effects that usually do not require medical attention (report to your doctor or health care professional if they continue or are bothersome): -diarrhea -hair loss -nausea, vomiting -tiredness This list may not describe all possible side effects. Call your doctor for medical advice about side effects. You may report side effects to FDA at 1-800-FDA-1088. Where should I keep my medicine? This drug is given in a hospital or clinic and will not be stored at home. NOTE: This sheet is a summary. It may not cover all possible information. If you have questions about this medicine, talk to your doctor, pharmacist, or health care provider.  2018 Elsevier/Gold Standard (2015-11-26 12:08:50)   Docetaxel injection What  is this medicine? DOCETAXEL (doe se TAX el) is a chemotherapy drug. It targets fast dividing cells, like cancer cells, and causes these cells to die. This medicine is used to treat many types of cancers like breast cancer, certain stomach cancers, head and neck cancer, lung cancer, and prostate cancer. This medicine may be used for other purposes; ask your health care provider or pharmacist if you have questions. COMMON BRAND NAME(S): Docefrez, Taxotere What should I tell my health care provider before I take this medicine? They need to know if you have any of these conditions: -infection (especially a virus infection such as chickenpox, cold sores, or herpes) -liver disease -low blood counts, like low white cell, platelet, or red cell counts -an unusual or allergic reaction to docetaxel, polysorbate 80, other chemotherapy agents, other medicines, foods, dyes, or preservatives -pregnant or trying to get pregnant -breast-feeding How should I use this medicine? This drug is given as an infusion into a vein. It is administered in a hospital or clinic by a specially trained health care professional. Talk to your pediatrician regarding the use of this medicine in children. Special care may be needed. Overdosage: If you think you have taken too much of this medicine  contact a poison control center or emergency room at once. NOTE: This medicine is only for you. Do not share this medicine with others. What if I miss a dose? It is important not to miss your dose. Call your doctor or health care professional if you are unable to keep an appointment. What may interact with this medicine? -cyclosporine -erythromycin -ketoconazole -medicines to increase blood counts like filgrastim, pegfilgrastim, sargramostim -vaccines Talk to your doctor or health care professional before taking any of these medicines: -acetaminophen -aspirin -ibuprofen -ketoprofen -naproxen This list may not describe all  possible interactions. Give your health care provider a list of all the medicines, herbs, non-prescription drugs, or dietary supplements you use. Also tell them if you smoke, drink alcohol, or use illegal drugs. Some items may interact with your medicine. What should I watch for while using this medicine? Your condition will be monitored carefully while you are receiving this medicine. You will need important blood work done while you are taking this medicine. This drug may make you feel generally unwell. This is not uncommon, as chemotherapy can affect healthy cells as well as cancer cells. Report any side effects. Continue your course of treatment even though you feel ill unless your doctor tells you to stop. In some cases, you may be given additional medicines to help with side effects. Follow all directions for their use. Call your doctor or health care professional for advice if you get a fever, chills or sore throat, or other symptoms of a cold or flu. Do not treat yourself. This drug decreases your body's ability to fight infections. Try to avoid being around people who are sick. This medicine may increase your risk to bruise or bleed. Call your doctor or health care professional if you notice any unusual bleeding. This medicine may contain alcohol in the product. You may get drowsy or dizzy. Do not drive, use machinery, or do anything that needs mental alertness until you know how this medicine affects you. Do not stand or sit up quickly, especially if you are an older patient. This reduces the risk of dizzy or fainting spells. Avoid alcoholic drinks. Do not become pregnant while taking this medicine. Women should inform their doctor if they wish to become pregnant or think they might be pregnant. There is a potential for serious side effects to an unborn child. Talk to your health care professional or pharmacist for more information. Do not breast-feed an infant while taking this medicine. What  side effects may I notice from receiving this medicine? Side effects that you should report to your doctor or health care professional as soon as possible: -allergic reactions like skin rash, itching or hives, swelling of the face, lips, or tongue -low blood counts - This drug may decrease the number of white blood cells, red blood cells and platelets. You may be at increased risk for infections and bleeding. -signs of infection - fever or chills, cough, sore throat, pain or difficulty passing urine -signs of decreased platelets or bleeding - bruising, pinpoint red spots on the skin, black, tarry stools, nosebleeds -signs of decreased red blood cells - unusually weak or tired, fainting spells, lightheadedness -breathing problems -fast or irregular heartbeat -low blood pressure -mouth sores -nausea and vomiting -pain, swelling, redness or irritation at the injection site -pain, tingling, numbness in the hands or feet -swelling of the ankle, feet, hands -weight gain Side effects that usually do not require medical attention (report to your doctor or health care professional if  they continue or are bothersome): -bone pain -complete hair loss including hair on your head, underarms, pubic hair, eyebrows, and eyelashes -diarrhea -excessive tearing -changes in the color of fingernails -loosening of the fingernails -nausea -muscle pain -red flush to skin -sweating -weak or tired This list may not describe all possible side effects. Call your doctor for medical advice about side effects. You may report side effects to FDA at 1-800-FDA-1088. Where should I keep my medicine? This drug is given in a hospital or clinic and will not be stored at home. NOTE: This sheet is a summary. It may not cover all possible information. If you have questions about this medicine, talk to your doctor, pharmacist, or health care provider.  2018 Elsevier/Gold Standard (2015-11-26 12:32:56)    Carboplatin  injection What is this medicine? CARBOPLATIN (KAR boe pla tin) is a chemotherapy drug. It targets fast dividing cells, like cancer cells, and causes these cells to die. This medicine is used to treat ovarian cancer and many other cancers. This medicine may be used for other purposes; ask your health care provider or pharmacist if you have questions. COMMON BRAND NAME(S): Paraplatin What should I tell my health care provider before I take this medicine? They need to know if you have any of these conditions: -blood disorders -hearing problems -kidney disease -recent or ongoing radiation therapy -an unusual or allergic reaction to carboplatin, cisplatin, other chemotherapy, other medicines, foods, dyes, or preservatives -pregnant or trying to get pregnant -breast-feeding How should I use this medicine? This drug is usually given as an infusion into a vein. It is administered in a hospital or clinic by a specially trained health care professional. Talk to your pediatrician regarding the use of this medicine in children. Special care may be needed. Overdosage: If you think you have taken too much of this medicine contact a poison control center or emergency room at once. NOTE: This medicine is only for you. Do not share this medicine with others. What if I miss a dose? It is important not to miss a dose. Call your doctor or health care professional if you are unable to keep an appointment. What may interact with this medicine? -medicines for seizures -medicines to increase blood counts like filgrastim, pegfilgrastim, sargramostim -some antibiotics like amikacin, gentamicin, neomycin, streptomycin, tobramycin -vaccines Talk to your doctor or health care professional before taking any of these medicines: -acetaminophen -aspirin -ibuprofen -ketoprofen -naproxen This list may not describe all possible interactions. Give your health care provider a list of all the medicines, herbs,  non-prescription drugs, or dietary supplements you use. Also tell them if you smoke, drink alcohol, or use illegal drugs. Some items may interact with your medicine. What should I watch for while using this medicine? Your condition will be monitored carefully while you are receiving this medicine. You will need important blood work done while you are taking this medicine. This drug may make you feel generally unwell. This is not uncommon, as chemotherapy can affect healthy cells as well as cancer cells. Report any side effects. Continue your course of treatment even though you feel ill unless your doctor tells you to stop. In some cases, you may be given additional medicines to help with side effects. Follow all directions for their use. Call your doctor or health care professional for advice if you get a fever, chills or sore throat, or other symptoms of a cold or flu. Do not treat yourself. This drug decreases your body's ability to fight infections. Try  to avoid being around people who are sick. This medicine may increase your risk to bruise or bleed. Call your doctor or health care professional if you notice any unusual bleeding. Be careful brushing and flossing your teeth or using a toothpick because you may get an infection or bleed more easily. If you have any dental work done, tell your dentist you are receiving this medicine. Avoid taking products that contain aspirin, acetaminophen, ibuprofen, naproxen, or ketoprofen unless instructed by your doctor. These medicines may hide a fever. Do not become pregnant while taking this medicine. Women should inform their doctor if they wish to become pregnant or think they might be pregnant. There is a potential for serious side effects to an unborn child. Talk to your health care professional or pharmacist for more information. Do not breast-feed an infant while taking this medicine. What side effects may I notice from receiving this medicine? Side effects  that you should report to your doctor or health care professional as soon as possible: -allergic reactions like skin rash, itching or hives, swelling of the face, lips, or tongue -signs of infection - fever or chills, cough, sore throat, pain or difficulty passing urine -signs of decreased platelets or bleeding - bruising, pinpoint red spots on the skin, black, tarry stools, nosebleeds -signs of decreased red blood cells - unusually weak or tired, fainting spells, lightheadedness -breathing problems -changes in hearing -changes in vision -chest pain -high blood pressure -low blood counts - This drug may decrease the number of white blood cells, red blood cells and platelets. You may be at increased risk for infections and bleeding. -nausea and vomiting -pain, swelling, redness or irritation at the injection site -pain, tingling, numbness in the hands or feet -problems with balance, talking, walking -trouble passing urine or change in the amount of urine Side effects that usually do not require medical attention (report to your doctor or health care professional if they continue or are bothersome): -hair loss -loss of appetite -metallic taste in the mouth or changes in taste This list may not describe all possible side effects. Call your doctor for medical advice about side effects. You may report side effects to FDA at 1-800-FDA-1088. Where should I keep my medicine? This drug is given in a hospital or clinic and will not be stored at home. NOTE: This sheet is a summary. It may not cover all possible information. If you have questions about this medicine, talk to your doctor, pharmacist, or health care provider.  2018 Elsevier/Gold Standard (2008-01-29 14:38:05)   Palonosetron Injection What is this medicine? PALONOSETRON (pal oh NOE se tron) is used to prevent nausea and vomiting caused by chemotherapy. It also helps prevent delayed nausea and vomiting that may occur a few days after  your treatment. This medicine may be used for other purposes; ask your health care provider or pharmacist if you have questions. COMMON BRAND NAME(S): Aloxi What should I tell my health care provider before I take this medicine? They need to know if you have any of these conditions: -an unusual or allergic reaction to palonosetron, dolasetron, granisetron, ondansetron, other medicines, foods, dyes, or preservatives -pregnant or trying to get pregnant -breast-feeding How should I use this medicine? This medicine is for infusion into a vein. It is given by a health care professional in a hospital or clinic setting. Talk to your pediatrician regarding the use of this medicine in children. While this drug may be prescribed for children as young as 1 month for  selected conditions, precautions do apply. Overdosage: If you think you have taken too much of this medicine contact a poison control center or emergency room at once. NOTE: This medicine is only for you. Do not share this medicine with others. What if I miss a dose? This does not apply. What may interact with this medicine? -certain medicines for depression, anxiety, or psychotic disturbances -fentanyl -linezolid -MAOIs like Carbex, Eldepryl, Marplan, Nardil, and Parnate -methylene blue (injected into a vein) -tramadol This list may not describe all possible interactions. Give your health care provider a list of all the medicines, herbs, non-prescription drugs, or dietary supplements you use. Also tell them if you smoke, drink alcohol, or use illegal drugs. Some items may interact with your medicine. What should I watch for while using this medicine? Your condition will be monitored carefully while you are receiving this medicine. What side effects may I notice from receiving this medicine? Side effects that you should report to your doctor or health care professional as soon as possible: -allergic reactions like skin rash, itching or  hives, swelling of the face, lips, or tongue -breathing problems -confusion -dizziness -fast, irregular heartbeat -fever and chills -loss of balance or coordination -seizures -sweating -swelling of the hands and feet -tremors -unusually weak or tired Side effects that usually do not require medical attention (report to your doctor or health care professional if they continue or are bothersome): -constipation or diarrhea -headache This list may not describe all possible side effects. Call your doctor for medical advice about side effects. You may report side effects to FDA at 1-800-FDA-1088. Where should I keep my medicine? This drug is given in a hospital or clinic and will not be stored at home. NOTE: This sheet is a summary. It may not cover all possible information. If you have questions about this medicine, talk to your doctor, pharmacist, or health care provider.  2018 Elsevier/Gold Standard (2013-08-30 10:38:36)

## 2017-03-09 NOTE — Progress Notes (Signed)
REFERRING PROVIDER: Chauncey Cruel, MD [address] [place], [place] [address]  PRIMARY PROVIDER:  Ann Held, DO  PRIMARY REASON FOR VISIT:  1. Malignant neoplasm of right breast in female, estrogen receptor positive, unspecified site of breast ([place])     HISTORY OF PRESENT ILLNESS:   [name], a 46 y.o. female, was seen for a [facility] cancer genetics consultation at the request of Dr. Jana Hakim due to a personal and family history of cancer.  [name] presents to clinic today to discuss the possibility of a hereditary predisposition to cancer, genetic testing, and to further clarify her future cancer risks, as well as potential cancer risks for family members.   CANCER HISTORY:  In March 2018, at the age of 24, Olivia Frederick was diagnosed with triple positive invasive ductal carcinoma of the right breast. She is currently undergoing neoadjuvant chemotherapy.   Past Medical History:  Diagnosis Date  . Cervical dysplasia     Past Surgical History:  Procedure Laterality Date  . BREAST EXCISIONAL BIOPSY Right   . BREAST SURGERY     right breast benign tumor     Social History   Social History  . Marital status: Married    Spouse name: N/A  . Number of children: N/A  . Years of education: N/A   Social History Main Topics  . Smoking status: Former Smoker    Quit date: 02/01/2017  . Smokeless tobacco: Never Used  . Alcohol use Yes  . Drug use: No  . Sexual activity: Not on file   Other Topics Concern  . Not on file   Social History Narrative  . No narrative on file     FAMILY HISTORY:  We obtained a detailed, 4-generation family history.  Significant diagnoses are listed below: Family History  Problem Relation Age of Onset  . Hypertension Mother   . Diabetes Father   . Leukemia Paternal Grandfather     d.64  . Breast cancer Cousin 40    Bilateral breast cancer. Daughter of paternal uncle with leukemia.  . Leukemia  Paternal Uncle 78  . Lung cancer Maternal Uncle 74  . Brain cancer Maternal Aunt 65    d.66  . Cancer Maternal Uncle     unspecified type  . Brain cancer Cousin 16    d.55 daughter of maternal aunt with brain cancer   Olivia Frederick has a son and daughter, ages 37 and 40, who are healthy. She has a brother, age 78, and sister, age 35, who have no histories of cancer. [name] parents are 6 and 73. Neither have had cancers.  [name] has one maternal aunt who lived to adulthood. This aunt died at 64 from a brain tumor that was diagnosed at age 5. This aunt had three daughters. One of those daughters also died from a brain tumor at age 55. [name] has four maternal uncles who lived to adulthood. One uncle died at 80 from lung cancer; another uncle died at an unknown age from an unspecified form of cancer; a third uncle died in his 68s in a car accident; the fourth uncle died in his early-60s from a heart attack. [name] also had another maternal aunt and uncle who died as children from illness/fever. Her maternal grandfather died in his 51s of unknown cause. Her maternal grandmother died at 23 without cancer.  [name] has two paternal aunts and two paternal uncles who are all living and in their  48s. One uncle has leukemia; the other uncle has an unspecified form of cancer. Olivia Frederick's paternal aunts do not have cancers, but one has a daughter who was diagnosed with bilateral breast cancer last year at age 79. This cousin reports that she had genetic testing and was negative. [name] other paternal aunt has a daughter with either blood or bone cancer. Her paternal grandfather died at 44 with leukemia. Her paternal grandmother died at 33 without cancers.  [name] maternal and paternal ancestors are of African American/black descent. There is no reported Ashkenazi Jewish ancestry. There is no known  consanguinity.  GENETIC COUNSELING ASSESSMENT: SHANEQUA Frederick is a 46 y.o. female with a recent diagnosis of breast cancer and a family history of cancers which is somewhat suggestive of a hereditary cancer syndrome and predisposition to cancer. We, therefore, discussed and recommended the following at today's visit.   DISCUSSION: We reviewed the characteristics, features and inheritance patterns of hereditary cancer syndromes. We also discussed genetic testing, including the appropriate family members to test, the process of testing, insurance coverage and turn-around-time for results. We discussed the implications of a negative, positive and/or variant of uncertain significant result. We recommended Olivia Frederick pursue genetic testing for the 46-gene Common Hereditary Cancers Panel offered by [facility]. [facility]'s Common Hereditary Cancers Panel includes analysis of the following 46 genes: APC, ATM, AXIN2, BARD1, BMPR1A, BRCA1, BRCA2, BRIP1, CDH1, CDKN2A, CHEK2, CTNNA1, DICER1, EPCAM, GREM1, HOXB13, KIT, MEN1, MLH1, MSH2, MSH3, MSH6, MUTYH, NBN, NF1, NTHL1, PALB2, PDGFRA, PMS2, POLD1, POLE, PTEN, RAD50, RAD51C, RAD51D, SDHA, SDHB, SDHC, SDHD, SMAD4, SMARCA4, STK11, TP53, TSC1, TSC2, and VHL.  Based on Olivia Frederick's personal and family history of cancer, she meets medical criteria for genetic testing. Despite that she meets criteria, she may still have an out of pocket cost. We discussed that if her out of pocket cost for testing is over $100, the laboratory will call and confirm whether she wants to proceed with testing.  If the out of pocket cost of testing is less than $100 she will be billed by the genetic testing laboratory.   PLAN: After considering the risks, benefits, and limitations, Ms. Pfahler  provided informed consent to pursue genetic testing and the blood sample was sent to [facility] for analysis of the 46-gene Common Hereditary Cancers Panel. Results should be  available within approximately 3 weeks' time, at which point they will be disclosed by telephone to Olivia Frederick, as will any additional recommendations warranted by these results. Ms. Puchalski will receive a summary of her genetic counseling visit and a copy of her results once available. This information will also be available in Epic.   Lastly, we encouraged Olivia Frederick to remain in contact with cancer genetics annually so that we can continuously update the family history and inform her of any changes in cancer genetics and testing that may be of benefit for this family.   Ms.  Becraft questions were answered to her satisfaction today. Our contact information was provided should additional questions or concerns arise. Thank you for the referral and allowing [place] to share in the care of your patient.   Mal Misty, MS, [facility] Certified Naval [email or web address] phone: [phone]  The patient was seen for a total of 30 minutes in face-to-face genetic counseling.  This patient was discussed with Drs. Magrinat, Lindi Adie and/or Burr Medico who agrees with the above.    _______________________________________________________________________ For Office Staff:  Number of people involved in session: 1 Was an Intern/  student involved with case: no

## 2017-03-09 NOTE — Progress Notes (Signed)
[facility]  Telephone:([phone]) [phone] [phone]     ID: KOLBI TOFTE DOB: 1971-09-15  MR#: [id number]  TRR#:[id number]  Patient Care Team: Ann Held, DO as PCP - General (Family Medicine) Excell Seltzer, MD as Consulting Physician (General Surgery) Chauncey Cruel, MD as Consulting Physician (Oncology) Gery Pray, MD as Consulting Physician (Radiation Oncology) Scot Dock, NP OTHER MD:  CHIEF COMPLAINT: Triple positive breast cancer  CURRENT TREATMENT: Neoadjuvant chemotherapy   BREAST CANCER HISTORY: From the original intake note:  Nyimah noted a change in her right breast around November 2017. She was not very concerned about it but did mention it at the time of mammography which had been scheduled for 01/30/2017. Accordingly she had bilateral diagnostic mammography with tomography and right breast ultrasonography that day at the [facility]. In the right breast there was an irregular mass measuring approximately 5 cm. On exam this was firm and palpable at the 12:00 location 2 cm from the nipple targeted ultrasonography showed an area of mixed echogenicity superiorly measuring at least 4.6 cm with a similar appearing adjacent area measuring 1.3 cm thought to be contiguous ultrasound of the right axilla showed one ymph node with cortical thickening.  Biopsy of the right breast mass 01/31/2017 showed ([id number]) invasive ductal carcinoma, grade 2, estrogen receptor 100% positive, with strong staining intensity, progesterone receptor 25% positive, with moderate staining intensity, with an MIB-1 of 25%, and HER-2 amplified with a signals ratio of 2.35, the number per cell being 4.0. Biopsy of the suspicious axillary lymph node showed only reactive  Her subsequent history is as detailed below  INTERVAL HISTORY: Shakeerah returns today for follow-up of her breast cancers. Today is day 1 cycle 2 of 6 planned cycles of  trastuzumab/pertuzumab with carboplating/docetaxel. Lia is doing well today.  She feels her tumor is shrinking and is excited about this.  She did have some back pain following Neulasta, however that has since resolved.  She is doing better today and denies any challenges such as fevers, chills, nausea, vomiting, neuropathy, or any further concerns.    REVIEW OF SYSTEMS: A detailed ROS today was otherwise noncontributory  PAST MEDICAL HISTORY: Past Medical History:  Diagnosis Date  . Cervical dysplasia     PAST SURGICAL HISTORY: Past Surgical History:  Procedure Laterality Date  . BREAST EXCISIONAL BIOPSY Right   . BREAST SURGERY     right breast benign tumor     FAMILY HISTORY Family History  Problem Relation Age of Onset  . Hypertension Mother   . Diabetes Father   . Leukemia Paternal Grandfather   . Breast cancer Cousin 87  . Cancer Paternal Uncle     details unknown  . Cancer Maternal Uncle     details unknown  The patient's parents are living, both in their early 55s as of April 2018. She has one brother and one sister. On the paternal side a grandfather was diagnosed with leukemia at age 52, an uncle also with leukemia at age 16, another uncle at age 36 with colon cancer, and a cousin with breast cancer at age 31. On the mother's side there was an uncle with cancer of some type, not known to the patient, diagnosed age 56   GYNECOLOGIC HISTORY:  No LMP recorded.  Menarche age 48. She is GX P2, first live birth age 89. As of April 2018 she is having regular periods. She never used oral contraceptives.    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 Son [name] is 26, daughter cavity is 35, as of April 2018.     ADVANCED DIRECTIVES: Not in place    HEALTH MAINTENANCE: Social History  Substance Use Topics  . Smoking status: Former Smoker    Quit date: 02/01/2017  . Smokeless tobacco: Never Used  . Alcohol use Yes     Colonoscopy:  n/a  PAP:  Bone density:   No Known Allergies  Current Outpatient Prescriptions  Medication Sig Dispense Refill  . acetaminophen (TYLENOL) 325 MG tablet Take 650 mg by mouth as needed.    Marland Kitchen dexamethasone (DECADRON) 4 MG tablet Take 2 tablets (8 mg total) by mouth 2 (two) times daily. Start the day before Taxotere. Then again the day after chemo for 3 days. 30 tablet 1  . diphenhydrAMINE (BENADRYL) 25 mg capsule Take 25 mg by mouth as needed.    . lidocaine-prilocaine (EMLA) cream Apply to affected area once 30 g 3  . LORazepam (ATIVAN) 0.5 MG tablet Take 1 tablet (0.5 mg total) by mouth every 6 (six) hours as needed (Nausea or vomiting). 30 tablet 0  . prochlorperazine (COMPAZINE) 10 MG tablet Take 1 tablet (10 mg total) by mouth every 6 (six) hours as needed (Nausea or vomiting). 30 tablet 1   No current facility-administered medications for this visit.     OBJECTIVE: Vitals:   03/09/17 1205  BP: 122/80  Pulse: 89  Resp: 18  Temp: 97.5 F (36.4 C)     Body mass index is 24.03 kg/m.    ECOG FS:1 - Symptomatic but completely ambulatory  GENERAL: Patient is a well appearing female in no acute distress HEENT:  Sclerae anicteric.  Oropharynx clear and moist. No ulcerations or evidence of oropharyngeal candidiasis. Neck is supple.  NODES:  No cervical, supraclavicular, or axillary lymphadenopathy palpated.  BREAST EXAM: right breast nodules softer, still present LUNGS:  Clear to auscultation bilaterally.  No wheezes or rhonchi. HEART:  Regular rate and rhythm. No murmur appreciated. ABDOMEN:  Soft, nontender.  Positive, normoactive bowel sounds. No organomegaly palpated. MSK:  No focal spinal tenderness to palpation. Full range of motion bilaterally in the upper extremities. EXTREMITIES:  No peripheral edema.   SKIN:  Clear with no obvious rashes or skin changes. No nail dyscrasia. NEURO:  Nonfocal. Well oriented.  Appropriate affect.    LAB RESULTS:  CMP     Component Value  Date/Time   NA 142 03/08/2017 1603   K 4.1 03/08/2017 1603   CO2 28 03/08/2017 1603   GLUCOSE 100 03/08/2017 1603   BUN 6.6 (L) 03/08/2017 1603   CREATININE 0.9 03/08/2017 1603   CALCIUM 10.1 03/08/2017 1603   PROT 7.9 03/08/2017 1603   ALBUMIN 4.0 03/08/2017 1603   AST 25 03/08/2017 1603   ALT 87 (H) 03/08/2017 1603   ALKPHOS 86 03/08/2017 1603   BILITOT 0.38 03/08/2017 1603    No results found for: TOTALPROTELP, ALBUMINELP, A1GS, A2GS, BETS, BETA2SER, GAMS, MSPIKE, SPEI  No results found for: KPAFRELGTCHN, LAMBDASER, KAPLAMBRATIO  Lab Results  Component Value Date   WBC 13.2 (H) 03/08/2017   NEUTROABS 9.0 (H) 03/08/2017   HGB 10.4 (L) 03/08/2017   HCT 30.4 (L) 03/08/2017   MCV 75.6 (L) 03/08/2017   PLT 410 (H) 03/08/2017      Chemistry      Component Value Date/Time   NA 142 03/08/2017 1603   K 4.1 03/08/2017 1603   CO2 28 03/08/2017  1603   BUN 6.6 (L) 03/08/2017 1603   CREATININE 0.9 03/08/2017 1603      Component Value Date/Time   CALCIUM 10.1 03/08/2017 1603   ALKPHOS 86 03/08/2017 1603   AST 25 03/08/2017 1603   ALT 87 (H) 03/08/2017 1603   BILITOT 0.38 03/08/2017 1603       No results found for: LABCA2  No components found for: [id number]  No results for input(s): INR in the last 168 hours.  Urinalysis    Component Value Date/Time   COLORURINE yellow 01/22/2010 1457   APPEARANCEUR Clear 01/22/2010 1457   LABSPEC 1.015 01/22/2010 1457   PHURINE 5.0 01/22/2010 1457   GLUCOSEU NEGATIVE 05/24/2007 1404   HGBUR trace-lysed 01/22/2010 1457   BILIRUBINUR negative 01/22/2010 [place] 05/24/2007 1404   PROTEINUR NEGATIVE 05/24/2007 1404   UROBILINOGEN 0.2 01/22/2010 1457   NITRITE negative 01/22/2010 1457   LEUKOCYTESUR  05/24/2007 1404    NEGATIVE MICROSCOPIC NOT DONE ON URINES WITH NEGATIVE PROTEIN, BLOOD, LEUKOCYTES, NITRITE, OR GLUCOSE <1000 mg/dL.     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 LABS:  None performed today EXAM: BILATERAL BREAST MRI WITH AND WITHOUT CONTRAST TECHNIQUE: Multiplanar, multisequence MR images of both breasts were obtained prior to and following the intravenous administration of 15 ml of MultiHance. THREE-DIMENSIONAL MR IMAGE RENDERING ON INDEPENDENT WORKSTATION: Three-dimensional MR images were rendered by post-processing of the original MR data on an independent workstation. The three-dimensional MR images were interpreted, and findings are reported in the following complete MRI report for this study. Three dimensional images were evaluated at the independent DynaCad workstation COMPARISON:  Prior mammograms and ultrasound FINDINGS: Breast composition: c. Heterogeneous fibroglandular tissue. Background parenchymal enhancement: Moderate. Right breast: 3 adjacent irregular masses within the upper and central right breast are identified as follows: A 1.5 x 1.6 x 1.5 cm upper retroareolar right breast mass A 3.1 x 1.6 x 1.3 cm mass in the upper right breast (anterior third) with biopsy clip artifact A 1.8 x 1.8 x 1.6 cm mass in the inner central right breast These 3 masses encompasses an area measuring 5.1 x 1.8 x 3.1 cm. A slightly irregular 1.2 x 0.9 x 1.2 cm enhancing mass within the lower outer right breast (at junction of the middle and posterior thirds) is noted. This mass lies 3 cm posterior and inferior to the 1.8 cm central right breast mass described above. Left breast: No mass or abnormal enhancement. Lymph nodes: No abnormal appearing lymph nodes. Ancillary findings:  None. IMPRESSION: Three adjacent irregular masses within the upper/ central right breast encompassing an area measuring 5.1 x 1.8 x 3.1 cm. The upper right breast mass (anterior third) has biopsy clip artifact and represents biopsy-proven malignancy. Suspicious 1.2 cm mass within the lower  outer right breast. Recommend second-look right breast ultrasound and biopsy if breast conservation is desired. No MR evidence of left breast malignancy or abnormal lymph nodes. RECOMMENDATION: Second-look right breast ultrasound and biopsy of 1.2 cm lower outer right breast mass if breast conservation is desired. Treatment plan otherwise. BI-RADS CATEGORY  6: Known biopsy-proven malignancy. Electronically Signed   By: Margarette Canada M.D.   On: 02/14/2017 10:44   Mm Clip Placement Right  Result Date: 02/17/2017 CLINICAL DATA:  A second look ultrasound of the right breast was performed today to evaluate in the suspicious 1 cm enhancing mass  in the lower outer quadrant of the right breast identified on recent breast MRI. The mass was detected on ultrasound and ultrasound-guided biopsy was performed today and dictated separately. The patient has a recent diagnosis of right breast cancer, following ultrasound-guided biopsy of an irregular palpable mass in the upper central right breast. The patient began chemotherapy yesterday. EXAM: DIAGNOSTIC RIGHT MAMMOGRAM POST ULTRASOUND BIOPSY COMPARISON:  Previous exam(s). FINDINGS: Mammographic images were obtained following ultrasound guided biopsy of a hypoechoic irregular mass in the 8:30 position of the right breast 5-6 cm from the nipple. A coil shaped biopsy clip is in the expected region of the biopsied mass. The mass is not well visualized mammographically. The coil shaped biopsy clip placed today is approximately 6 cm from the ribbon shaped biopsy clip previously placed, as measured on the CC view. On the 90 degree lateral view, the coil shaped biopsy clip is 3.5 cm posterior to the ribbon shaped biopsy clip previously placed. IMPRESSION: Satisfactory position of a coil shaped biopsy clip in the outer right breast. Final Assessment: Post Procedure Mammograms for Marker Placement Electronically Signed   By: Curlene Dolphin M.D.   On: 02/17/2017 16:48   [place] Rt Breast Bx W  Loc Dev 1st Lesion Img Bx Spec US Guide  Addendum Date: 03/06/2017   ADDENDUM REPORT: 03/06/2017 08:05 ADDENDUM: Pathology revealed GRADE III INVASIVE DUCTAL CARCINOMA, DUCTAL CARCINOMA IN SITU of the Right breast, 8:30 o'clock; 5 to 6 CMFN. This was found to be concordant by Dr. Curlene Dolphin. Pathology results were discussed with the patient by telephone. The patient reported doing well after the biopsy with tenderness at the site. Post biopsy instructions and care were reviewed and questions were answered. The patient was encouraged to call [facility] for any additional concerns. The patient has a recent diagnosis of right breast cancer and should follow her outlined treatment plan. Pathology results reported by Terie Purser, RN on 02/20/2017. Electronically Signed   By: Curlene Dolphin M.D.   On: 03/06/2017 08:05   Result Date: 03/06/2017 CLINICAL DATA:  The patient presents for a second look ultrasound of the right breast. She had a bilateral breast MRI performed February 14 2016. A 1.2 x 0.9 x 1.2 cm enhancing mass was noted in the lower outer right breast near the junction of the middle and posterior thirds. The patient has a recently diagnosed right breast cancer following biopsy of a palpable mass in the upper central right breast, periareolar. On evaluation today, I do detect an approximately 1.0 cm hypoechoic slightly irregular mass in the 8:30 position of the right breast 5-6 cm from the nipple. There is some vascular flow within the mass. The mass will be biopsied today. EXAM: ULTRASOUND GUIDED RIGHT BREAST CORE NEEDLE BIOPSY COMPARISON:  Previous exam(s). FINDINGS: I met with the patient and we discussed the procedure of ultrasound-guided biopsy, including benefits and alternatives. We discussed the high likelihood of a successful procedure. We discussed the risks of the procedure, including infection, bleeding, tissue injury, clip migration, and inadequate sampling. Informed  written consent was given. The usual time-out protocol was performed immediately prior to the procedure. Lesion quadrant: Lower outer quadrant Using sterile technique and 1% Lidocaine as local anesthetic, under direct ultrasound visualization, a 12 gauge spring-loaded device was used to perform biopsy of an approximate 1.0 cm hypoechoic irregular mass at 8:30 position 5-6 cm from the nipple using a lateral to medial approach. At the conclusion of the procedure a coil shaped  tissue marker clip was deployed into the biopsy cavity. Follow up 2 view mammogram was performed and dictated separately. IMPRESSION: Ultrasound guided biopsy of an approximately 1.0 cm mass detected on ultrasound in the 8:30 position of the right breast 5-6 cm from the nipple. This mass does correlate with the suspicious mass seen on recent breast MRI. No apparent complications. Electronically Signed: By: Curlene Dolphin M.D. On: 02/17/2017 17:19    ELIGIBLE FOR AVAILABLE RESEARCH PROTOCOL:   ASSESSMENT: 46 y.o.   woman status post right breast biopsy 02/01/2017 for a cT2 pN0, stage IB invasive ductal carcinoma, grade 2, estrogen and progesterone receptor positive, with an MIB-1 of 25%, and HER-2 amplified   (a) biopsy of a suspicious right axillary lymph node 01/24/2017 showed only reactive lymphoid tissue  (b) biopsy of right breast lower outer quadrant 02/17/2017 showed invasive ductal carcinoma, grade 3, estrogen and progesterone receptor positive, HER-2 not amplified, with an Mib-1 of 50%  (1) genetics testing pending  (2) neoadjuvant chemotherapy will consist of carboplatin, docetaxel, and trastuzumab with pertuzumab given every 3 weeks 6, starting 02/16/2017   (3) anti-HER-2 immunotherapy to continue to total 1 year  (4) definitive surgery to follow at the completion of chemotherapy  (5) adjuvant radiation as appropriate  (6) anti-estrogens to follow the completion of local treatment   PLAN: Nariya is doing  well today.  She will proceed with chemotherapy.  I reviewed her lab work with her and her ALT is 87.  After review with Dr. Jana Hakim, her Docetaxel was dose reduced.  He also dose reduced her Benadryl dose.  She will follow up in one week for labs and an evaluation by Dr. Jana Hakim.    A total of (20) minutes of face-to-face time was spent with this patient with greater than 50% of that time in counseling and care-coordination.   Scot Dock, NP   03/09/2017 12:18 PM Medical Oncology and Hematology [facility] [address] [place], [place] [address] Tel. [phone]    Fax. [phone]

## 2017-03-11 ENCOUNTER — Ambulatory Visit (HOSPITAL_BASED_OUTPATIENT_CLINIC_OR_DEPARTMENT_OTHER): Payer: 59

## 2017-03-11 VITALS — BP 128/72 | HR 89 | Temp 98.2°F | Resp 18

## 2017-03-11 DIAGNOSIS — Z170 Estrogen receptor positive status [ER+]: Principal | ICD-10-CM

## 2017-03-11 DIAGNOSIS — C50511 Malignant neoplasm of lower-outer quadrant of right female breast: Secondary | ICD-10-CM

## 2017-03-11 DIAGNOSIS — Z5189 Encounter for other specified aftercare: Secondary | ICD-10-CM

## 2017-03-11 DIAGNOSIS — C50411 Malignant neoplasm of upper-outer quadrant of right female breast: Secondary | ICD-10-CM

## 2017-03-11 MED ORDER — PEGFILGRASTIM INJECTION 6 MG/0.6ML ~~LOC~~
6.0000 mg | PREFILLED_SYRINGE | Freq: Once | SUBCUTANEOUS | Status: AC
  Administered 2017-03-11: 6 mg via SUBCUTANEOUS

## 2017-03-11 NOTE — Patient Instructions (Signed)
Pegfilgrastim injection What is this medicine? PEGFILGRASTIM (PEG fil gra stim) is a long-acting granulocyte colony-stimulating factor that stimulates the growth of neutrophils, a type of white blood cell important in the body's fight against infection. It is used to reduce the incidence of fever and infection in patients with certain types of cancer who are receiving chemotherapy that affects the bone marrow, and to increase survival after being exposed to high doses of radiation. This medicine may be used for other purposes; ask your health care provider or pharmacist if you have questions. COMMON BRAND NAME(S): Neulasta What should I tell my health care provider before I take this medicine? They need to know if you have any of these conditions: -kidney disease -latex allergy -ongoing radiation therapy -sickle cell disease -skin reactions to acrylic adhesives (On-Body Injector only) -an unusual or allergic reaction to pegfilgrastim, filgrastim, other medicines, foods, dyes, or preservatives -pregnant or trying to get pregnant -breast-feeding How should I use this medicine? This medicine is for injection under the skin. If you get this medicine at home, you will be taught how to prepare and give the pre-filled syringe or how to use the On-body Injector. Refer to the patient Instructions for Use for detailed instructions. Use exactly as directed. Tell your healthcare provider immediately if you suspect that the On-body Injector may not have performed as intended or if you suspect the use of the On-body Injector resulted in a missed or partial dose. It is important that you put your used needles and syringes in a special sharps container. Do not put them in a trash can. If you do not have a sharps container, call your pharmacist or healthcare provider to get one. Talk to your pediatrician regarding the use of this medicine in children. While this drug may be prescribed for selected conditions,  precautions do apply. Overdosage: If you think you have taken too much of this medicine contact a poison control center or emergency room at once. NOTE: This medicine is only for you. Do not share this medicine with others. What if I miss a dose? It is important not to miss your dose. Call your doctor or health care professional if you miss your dose. If you miss a dose due to an On-body Injector failure or leakage, a new dose should be administered as soon as possible using a single prefilled syringe for manual use. What may interact with this medicine? Interactions have not been studied. Give your health care provider a list of all the medicines, herbs, non-prescription drugs, or dietary supplements you use. Also tell them if you smoke, drink alcohol, or use illegal drugs. Some items may interact with your medicine. This list may not describe all possible interactions. Give your health care provider a list of all the medicines, herbs, non-prescription drugs, or dietary supplements you use. Also tell them if you smoke, drink alcohol, or use illegal drugs. Some items may interact with your medicine. What should I watch for while using this medicine? You may need blood work done while you are taking this medicine. If you are going to need a MRI, CT scan, or other procedure, tell your doctor that you are using this medicine (On-Body Injector only). What side effects may I notice from receiving this medicine? Side effects that you should report to your doctor or health care professional as soon as possible: -allergic reactions like skin rash, itching or hives, swelling of the face, lips, or tongue -dizziness -fever -pain, redness, or irritation at site   where injected -pinpoint red spots on the skin -red or dark-brown urine -shortness of breath or breathing problems -stomach or side pain, or pain at the shoulder -swelling -tiredness -trouble passing urine or change in the amount of urine Side  effects that usually do not require medical attention (report to your doctor or health care professional if they continue or are bothersome): -bone pain -muscle pain This list may not describe all possible side effects. Call your doctor for medical advice about side effects. You may report side effects to FDA at 1-800-FDA-1088. Where should I keep my medicine? Keep out of the reach of children. Store pre-filled syringes in a refrigerator between 2 and 8 degrees C (36 and 46 degrees F). Do not freeze. Keep in carton to protect from light. Throw away this medicine if it is left out of the refrigerator for more than 48 hours. Throw away any unused medicine after the expiration date. NOTE: This sheet is a summary. It may not cover all possible information. If you have questions about this medicine, talk to your doctor, pharmacist, or health care provider.  2018 Elsevier/Gold Standard (2016-10-20 12:58:03)  

## 2017-03-15 ENCOUNTER — Other Ambulatory Visit (HOSPITAL_BASED_OUTPATIENT_CLINIC_OR_DEPARTMENT_OTHER): Payer: 59

## 2017-03-15 ENCOUNTER — Ambulatory Visit (HOSPITAL_BASED_OUTPATIENT_CLINIC_OR_DEPARTMENT_OTHER): Payer: 59 | Admitting: Oncology

## 2017-03-15 VITALS — BP 120/64 | HR 92 | Temp 98.2°F | Ht 66.0 in | Wt 146.4 lb

## 2017-03-15 DIAGNOSIS — C50411 Malignant neoplasm of upper-outer quadrant of right female breast: Secondary | ICD-10-CM | POA: Diagnosis not present

## 2017-03-15 DIAGNOSIS — Z170 Estrogen receptor positive status [ER+]: Secondary | ICD-10-CM

## 2017-03-15 DIAGNOSIS — C50511 Malignant neoplasm of lower-outer quadrant of right female breast: Secondary | ICD-10-CM | POA: Diagnosis not present

## 2017-03-15 LAB — COMPREHENSIVE METABOLIC PANEL
ALT: 37 U/L (ref 0–55)
AST: 18 U/L (ref 5–34)
Albumin: 3.8 g/dL (ref 3.5–5.0)
Alkaline Phosphatase: 95 U/L (ref 40–150)
Anion Gap: 10 mEq/L (ref 3–11)
BUN: 12.1 mg/dL (ref 7.0–26.0)
CHLORIDE: 98 meq/L (ref 98–109)
CO2: 30 meq/L — AB (ref 22–29)
Calcium: 10 mg/dL (ref 8.4–10.4)
Creatinine: 0.8 mg/dL (ref 0.6–1.1)
GLUCOSE: 135 mg/dL (ref 70–140)
POTASSIUM: 4.3 meq/L (ref 3.5–5.1)
SODIUM: 138 meq/L (ref 136–145)
TOTAL PROTEIN: 7.2 g/dL (ref 6.4–8.3)
Total Bilirubin: 0.43 mg/dL (ref 0.20–1.20)

## 2017-03-15 LAB — CBC WITH DIFFERENTIAL/PLATELET
BASO%: 0.4 % (ref 0.0–2.0)
Basophils Absolute: 0.1 10*3/uL (ref 0.0–0.1)
EOS%: 1.7 % (ref 0.0–7.0)
Eosinophils Absolute: 0.3 10*3/uL (ref 0.0–0.5)
HEMATOCRIT: 29.7 % — AB (ref 34.8–46.6)
HEMOGLOBIN: 10 g/dL — AB (ref 11.6–15.9)
LYMPH#: 2.1 10*3/uL (ref 0.9–3.3)
LYMPH%: 12.9 % — ABNORMAL LOW (ref 14.0–49.7)
MCH: 25.5 pg (ref 25.1–34.0)
MCHC: 33.8 g/dL (ref 31.5–36.0)
MCV: 75.4 fL — ABNORMAL LOW (ref 79.5–101.0)
MONO#: 2.6 10*3/uL — ABNORMAL HIGH (ref 0.1–0.9)
MONO%: 16.1 % — ABNORMAL HIGH (ref 0.0–14.0)
NEUT#: 11.3 10*3/uL — ABNORMAL HIGH (ref 1.5–6.5)
NEUT%: 68.9 % (ref 38.4–76.8)
Platelets: 223 10*3/uL (ref 145–400)
RBC: 3.94 10*6/uL (ref 3.70–5.45)
RDW: 15.5 % — ABNORMAL HIGH (ref 11.2–14.5)
WBC: 16.3 10*3/uL — AB (ref 3.9–10.3)

## 2017-03-15 NOTE — Progress Notes (Signed)
[facility]  [phone] Fax:(336) [phone]     ID: Olivia Frederick DOB: 14-Mar-1971  MR#: [id number]  YHY#:[id number]  Patient Care Team: Zola Button, Grayling Congress, DO as PCP - General (Family Medicine) Glenna Fellows, MD as Consulting Physician (General Surgery) Gianluca Chhim, Valentino Hue, MD as Consulting Physician (Oncology) Antony Blackbird, MD as Consulting Physician (Radiation Oncology) Lowella Dell, MD OTHER MD:  CHIEF COMPLAINT: Triple positive breast cancer  CURRENT TREATMENT: Neoadjuvant chemotherapy   BREAST CANCER HISTORY: From the original intake note:  Kemani noted a change in her right breast around November 2017. She was not very concerned about it but did mention it at the time of mammography which had been scheduled for 01/30/2017. Accordingly she had bilateral diagnostic mammography with tomography and right breast [facility] In the right breast there was an irregular mass measuring approximately 5 cm. On exam this was firm and palpable at the 12:00 location 2 cm from the nipple targeted ultrasonography showed an area of mixed echogenicity superiorly measuring at least 4.6 cm with a similar appearing adjacent area measuring 1.3 cm thought to be contiguous ultrasound of the right axilla showed one ymph node with cortical thickening.  Biopsy of the right breast mass 01/31/2017 showed ([id number]) invasive ductal carcinoma, grade 2, estrogen receptor 100% positive, with strong staining intensity, progesterone receptor 25% positive, with moderate staining intensity, with an MIB-1 of 25%, and HER-2 amplified with a signals ratio of 2.35, the number per cell being 4.0. Biopsy of the suspicious axillary lymph node showed only reactive  Her subsequent history is as detailed below  INTERVAL HISTORY: Olivia Frederick returns today for follow-up of her triple negative breast cancer, today being day 8 cycle 2 of 6 planned cycles  of carboplatin, Taxotere, Herceptin and progenitor given 21 days apart. She receives Neulasta on day 3   REVIEW OF SYSTEMS: She did considerably better with this cycle, partly because she was less anxious she says and partly because she knew what to expect. She took her medicines exactly as prescribed and so she had essentially minimal nausea and no vomiting. She was able to eat and keep herself well-hydrated. Her port work fine. Surprisingly she has had absolutely no diarrhea. She has had no mouth sores. She has noted minimal changes in vision, being slightly blurred, is having some seasonal allergies, and had a slight back pain from the Neulasta. A detailed review of systems today was otherwise stable  PAST MEDICAL HISTORY: Past Medical History:  Diagnosis Date  . Cervical dysplasia     PAST SURGICAL HISTORY: Past Surgical History:  Procedure Laterality Date  . BREAST EXCISIONAL BIOPSY Right   . BREAST SURGERY     right breast benign tumor     FAMILY HISTORY Family History  Problem Relation Age of Onset  . Hypertension Mother   . Diabetes Father   . Leukemia Paternal Grandfather     d.64  . Breast cancer Cousin 40    Bilateral breast cancer. Daughter of paternal uncle with leukemia.  . Leukemia Paternal Uncle 58  . Lung cancer Maternal Uncle 74  . Brain cancer Maternal Aunt 65    d.66  . Cancer Maternal Uncle     unspecified type  . Brain cancer Cousin 11    d.55 daughter of maternal aunt with brain cancer  The patient's parents are living, both in their early 76s as of April 2018. She has one brother and one sister. On the  paternal side a grandfather was diagnosed with leukemia at age 80, an uncle also with leukemia at age 54, another uncle at age 17 with colon cancer, and a cousin with breast cancer at age 62. On the mother's side there was an uncle with cancer of some type, not known to the patient, diagnosed age 58   GYNECOLOGIC HISTORY:  No LMP recorded.  Menarche age  46. She is GX P2, first live birth age 46. As of April 2018 she is having regular periods. She never used oral contraceptives.    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 Son Olivia Frederick is 87, daughter Olivia Frederick is 57, as of April 2018.     ADVANCED DIRECTIVES: Not in place    HEALTH MAINTENANCE: Social History  Substance Use Topics  . Smoking status: Former Smoker    Quit date: 02/01/2017  . Smokeless tobacco: Never Used  . Alcohol use Yes     Colonoscopy: n/a  PAP:  Bone density:   No Known Allergies  Current Outpatient Prescriptions  Medication Sig Dispense Refill  . acetaminophen (TYLENOL) 325 MG tablet Take 650 mg by mouth as needed.    Marland Kitchen dexamethasone (DECADRON) 4 MG tablet Take 2 tablets (8 mg total) by mouth 2 (two) times daily. Start the day before Taxotere. Then again the day after chemo for 3 days. 30 tablet 1  . diphenhydrAMINE (BENADRYL) 25 mg capsule Take 25 mg by mouth as needed.    . lidocaine-prilocaine (EMLA) cream Apply to affected area once 30 g 3  . LORazepam (ATIVAN) 0.5 MG tablet Take 1 tablet (0.5 mg total) by mouth every 6 (six) hours as needed (Nausea or vomiting). 30 tablet 0  . prochlorperazine (COMPAZINE) 10 MG tablet Take 1 tablet (10 mg total) by mouth every 6 (six) hours as needed (Nausea or vomiting). 30 tablet 1   No current facility-administered medications for this visit.    Facility-Administered Medications Ordered in Other Visits  Medication Dose Route Frequency Provider Last Rate Last Dose  . heparin lock flush 100 unit/mL  500 Units Intracatheter Once PRN Donnalynn Wheeless, Virgie Dad, MD      . sodium chloride flush (NS) 0.9 % injection 10 mL  10 mL Intracatheter PRN Lonny Eisen, Virgie Dad, MD        OBJECTIVEShea [place]akes African-American woman in no acute distress Vitals:   03/15/17 1614  BP: 120/64  Pulse: 92  Temp: 98.2 F (36.8 C)     Body mass index is 23.63 kg/m.    ECOG FS:1 - Symptomatic but completely  ambulatory  Sclerae unicteric, pupils round and equal Oropharynx clear and moist No cervical or supraclavicular adenopathy Lungs no rales or rhonchi Heart regular rate and rhythm Abd soft, nontender, positive bowel sounds MSK no focal spinal tenderness, no upper extremity lymphedema Neuro: nonfocal, well oriented, appropriate affect Breasts: The right breast mass appears softer and harder to delineate. There is no erythema or other skin changes of concern in the right breast. The left breast is unremarkable. Both axillae are benign.  LAB RESULTS:  CMP     Component Value Date/Time   NA 138 03/15/2017 1530   K 4.3 03/15/2017 1530   CO2 30 (H) 03/15/2017 1530   GLUCOSE 135 03/15/2017 1530   BUN 12.1 03/15/2017 1530   CREATININE 0.8 03/15/2017 1530   CALCIUM 10.0 03/15/2017 1530   PROT 7.2 03/15/2017 1530   ALBUMIN 3.8 03/15/2017 1530   AST 18 03/15/2017 1530  ALT 37 03/15/2017 1530   ALKPHOS 95 03/15/2017 1530   BILITOT 0.43 03/15/2017 1530    No results found for: TOTALPROTELP, ALBUMINELP, A1GS, A2GS, BETS, BETA2SER, GAMS, MSPIKE, SPEI  No results found for: KPAFRELGTCHN, LAMBDASER, KAPLAMBRATIO  Lab Results  Component Value Date   WBC 16.3 (H) 03/15/2017   NEUTROABS 11.3 (H) 03/15/2017   HGB 10.0 (L) 03/15/2017   HCT 29.7 (L) 03/15/2017   MCV 75.4 (L) 03/15/2017   PLT 223 03/15/2017      Chemistry      Component Value Date/Time   NA 138 03/15/2017 1530   K 4.3 03/15/2017 1530   CO2 30 (H) 03/15/2017 1530   BUN 12.1 03/15/2017 1530   CREATININE 0.8 03/15/2017 1530      Component Value Date/Time   CALCIUM 10.0 03/15/2017 1530   ALKPHOS 95 03/15/2017 1530   AST 18 03/15/2017 1530   ALT 37 03/15/2017 1530   BILITOT 0.43 03/15/2017 1530       No results found for: LABCA2  No components found for: URKYHC623  No results for input(s): INR in the last 168 hours.  Urinalysis    Component Value Date/Time   COLORURINE yellow 01/22/2010 1457    APPEARANCEUR Clear 01/22/2010 1457   LABSPEC 1.015 01/22/2010 1457   PHURINE 5.0 01/22/2010 1457   GLUCOSEU NEGATIVE 05/24/2007 1404   HGBUR trace-lysed 01/22/2010 1457   BILIRUBINUR negative 01/22/2010 [place] 05/24/2007 1404   PROTEINUR NEGATIVE 05/24/2007 1404   UROBILINOGEN 0.2 01/22/2010 1457   NITRITE negative 01/22/2010 1457   LEUKOCYTESUR  05/24/2007 1404    NEGATIVE MICROSCOPIC NOT DONE ON URINES WITH NEGATIVE PROTEIN, BLOOD, LEUKOCYTES, NITRITE, OR GLUCOSE <1000 mg/dL.     STUDIES: Mm Clip Placement Right  Result Date: 02/17/2017 CLINICAL DATA:  A second look ultrasound of the right breast was performed today to evaluate in the suspicious 1 cm enhancing mass in the lower outer quadrant of the right breast identified on recent breast MRI. The mass was detected on ultrasound and ultrasound-guided biopsy was performed today and dictated separately. The patient has a recent diagnosis of right breast cancer, following ultrasound-guided biopsy of an irregular palpable mass in the upper central right breast. The patient began chemotherapy yesterday. EXAM: DIAGNOSTIC RIGHT MAMMOGRAM POST ULTRASOUND BIOPSY COMPARISON:  Previous exam(s). FINDINGS: Mammographic images were obtained following ultrasound guided biopsy of a hypoechoic irregular mass in the 8:30 position of the right breast 5-6 cm from the nipple. A coil shaped biopsy clip is in the expected region of the biopsied mass. The mass is not well visualized mammographically. The coil shaped biopsy clip placed today is approximately 6 cm from the ribbon shaped biopsy clip previously placed, as measured on the CC view. On the 90 degree lateral view, the coil shaped biopsy clip is 3.5 cm posterior to the ribbon shaped biopsy clip previously placed. IMPRESSION: Satisfactory position of a coil shaped biopsy clip in the outer right breast. Final Assessment: Post Procedure Mammograms for Marker Placement Electronically Signed   By:  Curlene Dolphin M.D.   On: 02/17/2017 16:48   [place] Rt Breast Bx W Loc Dev 1st Lesion Img Bx Spec US Guide  Addendum Date: 03/06/2017   ADDENDUM REPORT: 03/06/2017 08:05 ADDENDUM: Pathology revealed GRADE III INVASIVE DUCTAL CARCINOMA, DUCTAL CARCINOMA IN SITU of the Right breast, 8:30 o'clock; 5 to 6 CMFN. This was found to be concordant by Dr. Curlene Dolphin. Pathology results were discussed with the patient by telephone. The patient  reported doing well after the biopsy with tenderness at the site. Post biopsy instructions and care were reviewed and questions were answered. The patient was encouraged to call [facility] for any additional concerns. The patient has a recent diagnosis of right breast cancer and should follow her outlined treatment plan. Pathology results reported by Terie Purser, RN on 02/20/2017. Electronically Signed   By: Curlene Dolphin M.D.   On: 03/06/2017 08:05   Result Date: 03/06/2017 CLINICAL DATA:  The patient presents for a second look ultrasound of the right breast. She had a bilateral breast MRI performed February 14 2016. A 1.2 x 0.9 x 1.2 cm enhancing mass was noted in the lower outer right breast near the junction of the middle and posterior thirds. The patient has a recently diagnosed right breast cancer following biopsy of a palpable mass in the upper central right breast, periareolar. On evaluation today, I do detect an approximately 1.0 cm hypoechoic slightly irregular mass in the 8:30 position of the right breast 5-6 cm from the nipple. There is some vascular flow within the mass. The mass will be biopsied today. EXAM: ULTRASOUND GUIDED RIGHT BREAST CORE NEEDLE BIOPSY COMPARISON:  Previous exam(s). FINDINGS: I met with the patient and we discussed the procedure of ultrasound-guided biopsy, including benefits and alternatives. We discussed the high likelihood of a successful procedure. We discussed the risks of the procedure, including infection, bleeding,  tissue injury, clip migration, and inadequate sampling. Informed written consent was given. The usual time-out protocol was performed immediately prior to the procedure. Lesion quadrant: Lower outer quadrant Using sterile technique and 1% Lidocaine as local anesthetic, under direct ultrasound visualization, a 12 gauge spring-loaded device was used to perform biopsy of an approximate 1.0 cm hypoechoic irregular mass at 8:30 position 5-6 cm from the nipple using a lateral to medial approach. At the conclusion of the procedure a coil shaped tissue marker clip was deployed into the biopsy Olivia Frederick. Follow up 2 view mammogram was performed and dictated separately. IMPRESSION: Ultrasound guided biopsy of an approximately 1.0 cm mass detected on ultrasound in the 8:30 position of the right breast 5-6 cm from the nipple. This mass does correlate with the suspicious mass seen on recent breast MRI. No apparent complications. Electronically Signed: By: Curlene Dolphin M.D. On: 02/17/2017 17:19    ELIGIBLE FOR AVAILABLE RESEARCH PROTOCOL: No  ASSESSMENT: 46 y.o.  [place] woman status post right breast biopsy 02/01/2017 for a cT2 pN0, stage IB invasive ductal carcinoma, grade 2, estrogen and progesterone receptor positive, with an MIB-1 of 25%, and HER-2 amplified   (a) biopsy of a suspicious right axillary lymph node 01/24/2017 showed only reactive lymphoid tissue  (b) biopsy of right breast lower outer quadrant 02/17/2017 showed invasive ductal carcinoma, grade 3, estrogen and progesterone receptor positive, HER-2 not amplified, with an Mib-1 of 50%  (1) genetics testing pending  (2) neoadjuvant chemotherapy will consist of carboplatin, docetaxel, and trastuzumab with pertuzumab given every 3 weeks 6, starting 02/16/2017   (3) anti-HER-2 immunotherapy to continue to total 1 year  (a) baseline echocardiogram 02/13/2017 found an ejection fraction of 55-60%  (4) definitive surgery to follow at the completion of  chemotherapy  (5) adjuvant radiation as appropriate  (6) anti-estrogens to follow the completion of local treatment   PLAN: Liz i did well with her second cycle and I am not making any significant changes in her treatment.  She is concerned because she has developed a little bit  of blotchy on her right cheek. This is going to be due to chemotherapy and is certainly good progress. However it should eventually clear.  She was also concerned because her liver function tests had been elevated on treatment day cycle 2. They have now normalized. She understands that this is due to the chemotherapy and so long as it is minor we will continue on not interrupt her treatment. I did ask her to not have any alcohol 3 days before 3 days after each chemotherapy.  She will see [place] again on the day of her third cycle and I have made her a tentative appointment on May 31, to troubleshoot problems from that cycle. If everything is going well however she will only have lab work and she will cancel that visit.  Once she completes her chemotherapy treatments she will have a repeat breast MRI and proceed to definitive surgery  She knows to call for any problems that may develop before her next visit.  Chauncey Cruel, MD   03/15/2017 4:33 PM Medical Oncology and Hematology [facility] [address] [place], [place] [address] Tel. [phone]    Fax. [phone]

## 2017-03-29 ENCOUNTER — Other Ambulatory Visit (HOSPITAL_BASED_OUTPATIENT_CLINIC_OR_DEPARTMENT_OTHER): Payer: 59

## 2017-03-29 ENCOUNTER — Telehealth: Payer: Self-pay | Admitting: Genetics

## 2017-03-29 DIAGNOSIS — C50411 Malignant neoplasm of upper-outer quadrant of right female breast: Secondary | ICD-10-CM

## 2017-03-29 DIAGNOSIS — C50511 Malignant neoplasm of lower-outer quadrant of right female breast: Secondary | ICD-10-CM | POA: Diagnosis not present

## 2017-03-29 DIAGNOSIS — Z170 Estrogen receptor positive status [ER+]: Principal | ICD-10-CM

## 2017-03-29 LAB — COMPREHENSIVE METABOLIC PANEL
ALT: 85 U/L — AB (ref 0–55)
AST: 38 U/L — AB (ref 5–34)
Albumin: 4.1 g/dL (ref 3.5–5.0)
Alkaline Phosphatase: 93 U/L (ref 40–150)
Anion Gap: 11 mEq/L (ref 3–11)
BUN: 11.3 mg/dL (ref 7.0–26.0)
CALCIUM: 9.9 mg/dL (ref 8.4–10.4)
CHLORIDE: 104 meq/L (ref 98–109)
CO2: 26 meq/L (ref 22–29)
CREATININE: 0.8 mg/dL (ref 0.6–1.1)
EGFR: >90 mL/min/{1.73_m2} (ref >90)
Glucose: 104 mg/dl (ref 70–140)
Potassium: 3.9 mEq/L (ref 3.5–5.1)
Sodium: 141 mEq/L (ref 136–145)
Total Bilirubin: 0.41 mg/dL (ref 0.20–1.20)
Total Protein: 7.6 g/dL (ref 6.4–8.3)

## 2017-03-29 LAB — CBC WITH DIFFERENTIAL/PLATELET
BASO%: 0.4 % (ref 0.0–2.0)
Basophils Absolute: 0.1 10*3/uL (ref 0.0–0.1)
EOS%: 0.7 % (ref 0.0–7.0)
Eosinophils Absolute: 0.1 10*3/uL (ref 0.0–0.5)
HCT: 29.4 % — ABNORMAL LOW (ref 34.8–46.6)
HEMOGLOBIN: 10.2 g/dL — AB (ref 11.6–15.9)
LYMPH#: 2.6 10*3/uL (ref 0.9–3.3)
LYMPH%: 22.9 % (ref 14.0–49.7)
MCH: 26.2 pg (ref 25.1–34.0)
MCHC: 34.7 g/dL (ref 31.5–36.0)
MCV: 75.6 fL — ABNORMAL LOW (ref 79.5–101.0)
MONO#: 1.1 10*3/uL — AB (ref 0.1–0.9)
MONO%: 10.2 % (ref 0.0–14.0)
NEUT#: 7.4 10*3/uL — ABNORMAL HIGH (ref 1.5–6.5)
NEUT%: 65.8 % (ref 38.4–76.8)
PLATELETS: 277 10*3/uL (ref 145–400)
RBC: 3.89 10*6/uL (ref 3.70–5.45)
RDW: 18.6 % — ABNORMAL HIGH (ref 11.2–14.5)
WBC: 11.2 10*3/uL — ABNORMAL HIGH (ref 3.9–10.3)
nRBC: 0 % (ref 0–0)

## 2017-03-29 NOTE — Telephone Encounter (Signed)
-----   Message from Mal Misty sent at 03/09/2017  3:23 PM EDT ----- Regarding: Call Results Advise that daughter begin mammograms at 78. Route to Dr. Jana Hakim. Pt undergoing neoadjuvant chemo. Got first treatment during our meeting.

## 2017-03-30 ENCOUNTER — Ambulatory Visit (HOSPITAL_BASED_OUTPATIENT_CLINIC_OR_DEPARTMENT_OTHER): Payer: 59 | Admitting: Adult Health

## 2017-03-30 ENCOUNTER — Encounter: Payer: Self-pay | Admitting: Adult Health

## 2017-03-30 ENCOUNTER — Other Ambulatory Visit: Payer: 59

## 2017-03-30 ENCOUNTER — Other Ambulatory Visit: Payer: Self-pay | Admitting: Hematology and Oncology

## 2017-03-30 ENCOUNTER — Encounter: Payer: Self-pay | Admitting: *Deleted

## 2017-03-30 ENCOUNTER — Ambulatory Visit (HOSPITAL_BASED_OUTPATIENT_CLINIC_OR_DEPARTMENT_OTHER): Payer: 59

## 2017-03-30 VITALS — BP 125/56 | HR 77 | Temp 98.1°F | Resp 19 | Ht 66.0 in | Wt 147.5 lb

## 2017-03-30 DIAGNOSIS — Z170 Estrogen receptor positive status [ER+]: Secondary | ICD-10-CM

## 2017-03-30 DIAGNOSIS — C50511 Malignant neoplasm of lower-outer quadrant of right female breast: Secondary | ICD-10-CM

## 2017-03-30 DIAGNOSIS — Z5111 Encounter for antineoplastic chemotherapy: Secondary | ICD-10-CM

## 2017-03-30 DIAGNOSIS — Z5112 Encounter for antineoplastic immunotherapy: Secondary | ICD-10-CM

## 2017-03-30 DIAGNOSIS — C50411 Malignant neoplasm of upper-outer quadrant of right female breast: Secondary | ICD-10-CM

## 2017-03-30 LAB — PREGNANCY, URINE: PREGNANCY TEST, URINE: NEGATIVE

## 2017-03-30 MED ORDER — SODIUM CHLORIDE 0.9 % IV SOLN
553.0000 mg | Freq: Once | INTRAVENOUS | Status: AC
  Administered 2017-03-30: 550 mg via INTRAVENOUS
  Filled 2017-03-30: qty 55

## 2017-03-30 MED ORDER — DIPHENHYDRAMINE HCL 25 MG PO CAPS
ORAL_CAPSULE | ORAL | Status: AC
  Filled 2017-03-30: qty 1

## 2017-03-30 MED ORDER — ACETAMINOPHEN 325 MG PO TABS
ORAL_TABLET | ORAL | Status: AC
  Filled 2017-03-30: qty 2

## 2017-03-30 MED ORDER — DIPHENHYDRAMINE HCL 25 MG PO CAPS
25.0000 mg | ORAL_CAPSULE | Freq: Once | ORAL | Status: AC
  Administered 2017-03-30: 25 mg via ORAL

## 2017-03-30 MED ORDER — DEXAMETHASONE SODIUM PHOSPHATE 10 MG/ML IJ SOLN
10.0000 mg | Freq: Once | INTRAMUSCULAR | Status: AC
  Administered 2017-03-30: 10 mg via INTRAVENOUS

## 2017-03-30 MED ORDER — PALONOSETRON HCL INJECTION 0.25 MG/5ML
0.2500 mg | Freq: Once | INTRAVENOUS | Status: AC
  Administered 2017-03-30: 0.25 mg via INTRAVENOUS

## 2017-03-30 MED ORDER — HEPARIN SOD (PORK) LOCK FLUSH 100 UNIT/ML IV SOLN
500.0000 [IU] | Freq: Once | INTRAVENOUS | Status: AC | PRN
  Administered 2017-03-30: 500 [IU]
  Filled 2017-03-30: qty 5

## 2017-03-30 MED ORDER — DOCETAXEL CHEMO INJECTION 160 MG/16ML
60.0000 mg/m2 | Freq: Once | INTRAVENOUS | Status: AC
  Administered 2017-03-30: 110 mg via INTRAVENOUS
  Filled 2017-03-30: qty 11

## 2017-03-30 MED ORDER — ACETAMINOPHEN 325 MG PO TABS
650.0000 mg | ORAL_TABLET | Freq: Once | ORAL | Status: AC
  Administered 2017-03-30: 650 mg via ORAL

## 2017-03-30 MED ORDER — SODIUM CHLORIDE 0.9% FLUSH
10.0000 mL | INTRAVENOUS | Status: DC | PRN
  Administered 2017-03-30: 10 mL
  Filled 2017-03-30: qty 10

## 2017-03-30 MED ORDER — PALONOSETRON HCL INJECTION 0.25 MG/5ML
INTRAVENOUS | Status: AC
  Filled 2017-03-30: qty 5

## 2017-03-30 MED ORDER — SODIUM CHLORIDE 0.9 % IV SOLN
420.0000 mg | Freq: Once | INTRAVENOUS | Status: AC
  Administered 2017-03-30: 420 mg via INTRAVENOUS
  Filled 2017-03-30: qty 14

## 2017-03-30 MED ORDER — SODIUM CHLORIDE 0.9 % IV SOLN
Freq: Once | INTRAVENOUS | Status: AC
  Administered 2017-03-30: 13:00:00 via INTRAVENOUS

## 2017-03-30 MED ORDER — DEXAMETHASONE SODIUM PHOSPHATE 10 MG/ML IJ SOLN
INTRAMUSCULAR | Status: AC
  Filled 2017-03-30: qty 1

## 2017-03-30 MED ORDER — TRASTUZUMAB CHEMO 150 MG IV SOLR
6.0000 mg/kg | Freq: Once | INTRAVENOUS | Status: AC
  Administered 2017-03-30: 399 mg via INTRAVENOUS
  Filled 2017-03-30: qty 19

## 2017-03-30 NOTE — Patient Instructions (Signed)
[facility] Discharge Instructions for Patients Receiving Chemotherapy  Today you received the following chemotherapy agents: trastuzumab (Herceptin), pertuzumab (Perjeta), docetaxel (Taxotere) and carboplatin (Paraplatin).   To help prevent nausea and vomiting after your treatment, we encourage you to take your nausea medication as directed.    If you develop nausea and vomiting that is not controlled by your nausea medication, call the clinic.   BELOW ARE SYMPTOMS THAT SHOULD BE REPORTED IMMEDIATELY:  *FEVER GREATER THAN 100.5 F  *CHILLS WITH OR WITHOUT FEVER  NAUSEA AND VOMITING THAT IS NOT CONTROLLED WITH YOUR NAUSEA MEDICATION  *UNUSUAL SHORTNESS OF BREATH  *UNUSUAL BRUISING OR BLEEDING  TENDERNESS IN MOUTH AND THROAT WITH OR WITHOUT PRESENCE OF ULCERS  *URINARY PROBLEMS  *BOWEL PROBLEMS  UNUSUAL RASH Items with * indicate a potential emergency and should be followed up as soon as possible.  Feel free to call the clinic you have any questions or concerns. The clinic phone number is [phone].  Please show the [facility] at check-in to the Emergency Department and triage nurse.

## 2017-03-30 NOTE — Progress Notes (Signed)
[place]  Telephone:(336) [phone] [phone]     ID: Olivia Frederick DOB: January 25, 1971  MR#: [id number]  GXQ#:[id number]  Patient Care Team: Carollee Herter, Alferd Apa, DO as PCP - General (Family Medicine) Excell Seltzer, MD as Consulting Physician (General Surgery) Magrinat, Virgie Dad, MD as Consulting Physician (Oncology) Gery Pray, MD as Consulting Physician (Radiation Oncology) Scot Dock, NP OTHER MD:  CHIEF COMPLAINT: Triple positive breast cancer  CURRENT TREATMENT: Neoadjuvant chemotherapy   BREAST CANCER HISTORY: From the original intake note:  Olivia Frederick noted a change in her right breast around November 2017. She was not very concerned about it but did mention it at the time of mammography which had been scheduled for 01/30/2017. Accordingly she had bilateral diagnostic mammography with tomography and right breast ultrasonography that day at the [facility]. In the right breast there was an irregular mass measuring approximately 5 cm. On exam this was firm and palpable at the 12:00 location 2 cm from the nipple targeted ultrasonography showed an area of mixed echogenicity superiorly measuring at least 4.6 cm with a similar appearing adjacent area measuring 1.3 cm thought to be contiguous ultrasound of the right axilla showed one ymph node with cortical thickening.  Biopsy of the right breast mass 01/31/2017 showed ([id number]) invasive ductal carcinoma, grade 2, estrogen receptor 100% positive, with strong staining intensity, progesterone receptor 25% positive, with moderate staining intensity, with an MIB-1 of 25%, and HER-2 amplified with a signals ratio of 2.35, the number per cell being 4.0. Biopsy of the suspicious axillary lymph node showed only reactive  Her subsequent history is as detailed below  INTERVAL HISTORY: Olivia Frederick returns today for follow-up of her triple negative breast cancer, today being day 1, cycle 3 of Docetaxel,  Carboplatin, Pertuzumab and Trastuzumab.  She is doing well today.  She denies any issues.  She does have some scheduling concerns.     REVIEW OF SYSTEMS: Olivia Frederick denies peripheral neuropathy, fevers, chills, nausea, vomiting, diarrhea, constipation, abdominal pain, mucositis or any further concerns.  A detailed ROS is non contributory.   PAST MEDICAL HISTORY: Past Medical History:  Diagnosis Date  . Cervical dysplasia     PAST SURGICAL HISTORY: Past Surgical History:  Procedure Laterality Date  . BREAST EXCISIONAL BIOPSY Right   . BREAST SURGERY     right breast benign tumor     FAMILY HISTORY Family History  Problem Relation Age of Onset  . Hypertension Mother   . Diabetes Father   . Leukemia Paternal Grandfather        d.64  . Breast cancer Cousin 40       Bilateral breast cancer. Daughter of paternal uncle with leukemia.  . Leukemia Paternal Uncle 10  . Lung cancer Maternal Uncle 74  . Brain cancer Maternal Aunt 65       d.66  . Cancer Maternal Uncle        unspecified type  . Brain cancer Cousin 46       d.55 daughter of maternal aunt with brain cancer  The patient's parents are living, both in their early 21s as of April 2018. She has one brother and one sister. On the paternal side a grandfather was diagnosed with leukemia at age 28, an uncle also with leukemia at age 22, another uncle at age 17 with colon cancer, and a cousin with breast cancer at age 15. On the mother's side there was an uncle with cancer of some type, not known to  the patient, diagnosed age 8   GYNECOLOGIC HISTORY:  No LMP recorded.  Menarche age 85. She is GX P2, first live birth age 83. As of April 2018 she is having regular periods. She never used oral contraceptives.    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 Son [name] is 58, daughter cavity is 51, as of April 2018.     ADVANCED DIRECTIVES: Not in place    HEALTH MAINTENANCE: Social History  Substance  Use Topics  . Smoking status: Former Smoker    Quit date: 02/01/2017  . Smokeless tobacco: Never Used  . Alcohol use Yes     Colonoscopy: n/a  PAP:  Bone density:   No Known Allergies  Current Outpatient Prescriptions  Medication Sig Dispense Refill  . acetaminophen (TYLENOL) 325 MG tablet Take 650 mg by mouth as needed.    Marland Kitchen dexamethasone (DECADRON) 4 MG tablet Take 2 tablets (8 mg total) by mouth 2 (two) times daily. Start the day before Taxotere. Then again the day after chemo for 3 days. 30 tablet 1  . diphenhydrAMINE (BENADRYL) 25 mg capsule Take 25 mg by mouth as needed.    . lidocaine-prilocaine (EMLA) cream Apply to affected area once 30 g 3  . LORazepam (ATIVAN) 0.5 MG tablet Take 1 tablet (0.5 mg total) by mouth every 6 (six) hours as needed (Nausea or vomiting). 30 tablet 0  . prochlorperazine (COMPAZINE) 10 MG tablet Take 1 tablet (10 mg total) by mouth every 6 (six) hours as needed (Nausea or vomiting). 30 tablet 1   No current facility-administered medications for this visit.    Facility-Administered Medications Ordered in Other Visits  Medication Dose Route Frequency Provider Last Rate Last Dose  . heparin lock flush 100 unit/mL  500 Units Intracatheter Once PRN Magrinat, Virgie Dad, MD      . sodium chloride flush (NS) 0.9 % injection 10 mL  10 mL Intracatheter PRN Magrinat, Virgie Dad, MD        OBJECTIVE:  Vitals:   03/30/17 1130  BP: (!) 125/56  Pulse: 77  Resp: 19  Temp: 98.1 F (36.7 C)     Body mass index is 23.81 kg/m.    ECOG FS:1 - Symptomatic but completely ambulatory GENERAL: Patient is a well appearing female in no acute distress HEENT:  Sclerae anicteric.  Oropharynx clear and moist. No ulcerations or evidence of oropharyngeal candidiasis. Neck is supple.  NODES:  No cervical, supraclavicular, or axillary lymphadenopathy palpated.  BREAST EXAM:  Unable to definitely palpate mass in right breast LUNGS:  Clear to auscultation bilaterally.  No wheezes  or rhonchi. HEART:  Regular rate and rhythm. No murmur appreciated. ABDOMEN:  Soft, nontender.  Positive, normoactive bowel sounds. No organomegaly palpated. MSK:  No focal spinal tenderness to palpation. Full range of motion bilaterally in the upper extremities. EXTREMITIES:  No peripheral edema.   SKIN:  Clear with no obvious rashes or skin changes. No nail dyscrasia. NEURO:  Nonfocal. Well oriented.  Appropriate affect.    LAB RESULTS:  CMP     Component Value Date/Time   NA 141 03/29/2017 1602   K 3.9 03/29/2017 1602   CO2 26 03/29/2017 1602   GLUCOSE 104 03/29/2017 1602   BUN 11.3 03/29/2017 1602   CREATININE 0.8 03/29/2017 1602   CALCIUM 9.9 03/29/2017 1602   PROT 7.6 03/29/2017 1602   ALBUMIN 4.1 03/29/2017 1602   AST 38 (H) 03/29/2017 1602   ALT 85 (  H) 03/29/2017 1602   ALKPHOS 93 03/29/2017 1602   BILITOT 0.41 03/29/2017 1602    No results found for: TOTALPROTELP, ALBUMINELP, A1GS, A2GS, BETS, BETA2SER, GAMS, MSPIKE, SPEI  No results found for: KPAFRELGTCHN, LAMBDASER, KAPLAMBRATIO  Lab Results  Component Value Date   WBC 11.2 (H) 03/29/2017   NEUTROABS 7.4 (H) 03/29/2017   HGB 10.2 (L) 03/29/2017   HCT 29.4 (L) 03/29/2017   MCV 75.6 (L) 03/29/2017   PLT 277 03/29/2017      Chemistry      Component Value Date/Time   NA 141 03/29/2017 1602   K 3.9 03/29/2017 1602   CO2 26 03/29/2017 1602   BUN 11.3 03/29/2017 1602   CREATININE 0.8 03/29/2017 1602      Component Value Date/Time   CALCIUM 9.9 03/29/2017 1602   ALKPHOS 93 03/29/2017 1602   AST 38 (H) 03/29/2017 1602   ALT 85 (H) 03/29/2017 1602   BILITOT 0.41 03/29/2017 1602       No results found for: LABCA2  No components found for: [id number]  No results for input(s): INR in the last 168 hours.  Urinalysis    Component Value Date/Time   COLORURINE yellow 01/22/2010 1457   APPEARANCEUR Clear 01/22/2010 1457   LABSPEC 1.015 01/22/2010 1457   PHURINE 5.0 01/22/2010 1457   GLUCOSEU  NEGATIVE 05/24/2007 1404   HGBUR trace-lysed 01/22/2010 1457   BILIRUBINUR negative 01/22/2010 [place] 05/24/2007 1404   PROTEINUR NEGATIVE 05/24/2007 1404   UROBILINOGEN 0.2 01/22/2010 1457   NITRITE negative 01/22/2010 1457   LEUKOCYTESUR  05/24/2007 1404    NEGATIVE MICROSCOPIC NOT DONE ON URINES WITH NEGATIVE PROTEIN, BLOOD, LEUKOCYTES, NITRITE, OR GLUCOSE <1000 mg/dL.     STUDIES: No results found.  ELIGIBLE FOR AVAILABLE RESEARCH PROTOCOL: No  ASSESSMENT: 46 y.o.  [place] woman status post right breast biopsy 02/01/2017 for a cT2 pN0, stage IB invasive ductal carcinoma, grade 2, estrogen and progesterone receptor positive, with an MIB-1 of 25%, and HER-2 amplified   (a) biopsy of a suspicious right axillary lymph node 01/24/2017 showed only reactive lymphoid tissue  (b) biopsy of right breast lower outer quadrant 02/17/2017 showed invasive ductal carcinoma, grade 3, estrogen and progesterone receptor positive, HER-2 not amplified, with an Mib-1 of 50%  (1) genetics testing pending  (2) neoadjuvant chemotherapy will consist of carboplatin, docetaxel, and trastuzumab with pertuzumab given every 3 weeks 6, starting 02/16/2017   (3) anti-HER-2 immunotherapy to continue to total 1 year  (a) baseline echocardiogram 02/13/2017 found an ejection fraction of 55-60%  (4) definitive surgery to follow at the completion of chemotherapy  (5) adjuvant radiation as appropriate  (6) anti-estrogens to follow the completion of local treatment   PLAN: Olivia Frederick is doing well today.  She will proceed with chemotherapy.  Her LFTs are mildly elevated again today.  I reviewed with Dr. Lindi Adie and he reviewed her chemotherapy dosage and lab results and "ok'ed" her to get treated.  I will work on getting her appts fixed.    She knows to call for any problems that may develop before her next visit.  A total of (20) minutes of face-to-face time was spent with this patient with  greater than 50% of that time in counseling and care-coordination.   Scot Dock, NP   03/30/2017 11:38 AM Medical Oncology and Hematology [facility] [address] [place], [place] [address] Tel. [phone]    Fax. [phone]

## 2017-04-01 ENCOUNTER — Ambulatory Visit (HOSPITAL_BASED_OUTPATIENT_CLINIC_OR_DEPARTMENT_OTHER): Payer: 59

## 2017-04-01 VITALS — BP 126/74 | HR 73 | Temp 98.5°F | Resp 18

## 2017-04-01 DIAGNOSIS — Z170 Estrogen receptor positive status [ER+]: Principal | ICD-10-CM

## 2017-04-01 DIAGNOSIS — C50511 Malignant neoplasm of lower-outer quadrant of right female breast: Secondary | ICD-10-CM

## 2017-04-01 DIAGNOSIS — Z5189 Encounter for other specified aftercare: Secondary | ICD-10-CM | POA: Diagnosis not present

## 2017-04-01 DIAGNOSIS — C50411 Malignant neoplasm of upper-outer quadrant of right female breast: Secondary | ICD-10-CM

## 2017-04-01 MED ORDER — PEGFILGRASTIM INJECTION 6 MG/0.6ML ~~LOC~~
6.0000 mg | PREFILLED_SYRINGE | Freq: Once | SUBCUTANEOUS | Status: AC
  Administered 2017-04-01: 6 mg via SUBCUTANEOUS

## 2017-04-04 ENCOUNTER — Telehealth: Payer: Self-pay | Admitting: *Deleted

## 2017-04-04 NOTE — Telephone Encounter (Signed)
Pt called to cx appts for 5/31. Per Mendel Ryder, NP may cancel appts if feeling well. Pt denies symptoms and request to cancel appts.

## 2017-04-06 ENCOUNTER — Other Ambulatory Visit: Payer: 59

## 2017-04-06 ENCOUNTER — Ambulatory Visit: Payer: 59 | Admitting: Oncology

## 2017-04-19 ENCOUNTER — Other Ambulatory Visit: Payer: Self-pay | Admitting: *Deleted

## 2017-04-19 ENCOUNTER — Encounter: Payer: Self-pay | Admitting: *Deleted

## 2017-04-19 ENCOUNTER — Other Ambulatory Visit (HOSPITAL_BASED_OUTPATIENT_CLINIC_OR_DEPARTMENT_OTHER): Payer: 59

## 2017-04-19 DIAGNOSIS — Z170 Estrogen receptor positive status [ER+]: Principal | ICD-10-CM

## 2017-04-19 DIAGNOSIS — C50411 Malignant neoplasm of upper-outer quadrant of right female breast: Secondary | ICD-10-CM

## 2017-04-19 DIAGNOSIS — C50511 Malignant neoplasm of lower-outer quadrant of right female breast: Secondary | ICD-10-CM

## 2017-04-19 LAB — COMPREHENSIVE METABOLIC PANEL
ALT: 51 U/L (ref 0–55)
AST: 29 U/L (ref 5–34)
Albumin: 4.2 g/dL (ref 3.5–5.0)
Alkaline Phosphatase: 84 U/L (ref 40–150)
Anion Gap: 9 mEq/L (ref 3–11)
BILIRUBIN TOTAL: 0.43 mg/dL (ref 0.20–1.20)
BUN: 9.8 mg/dL (ref 7.0–26.0)
CO2: 27 mEq/L (ref 22–29)
CREATININE: 0.8 mg/dL (ref 0.6–1.1)
Calcium: 10.4 mg/dL (ref 8.4–10.4)
Chloride: 104 mEq/L (ref 98–109)
EGFR: >90 mL/min/{1.73_m2} (ref >90)
Glucose: 132 mg/dl (ref 70–140)
Potassium: 4.4 mEq/L (ref 3.5–5.1)
Sodium: 141 mEq/L (ref 136–145)
TOTAL PROTEIN: 7.8 g/dL (ref 6.4–8.3)

## 2017-04-19 LAB — CBC WITH DIFFERENTIAL/PLATELET
BASO%: 0.1 % (ref 0.0–2.0)
Basophils Absolute: 0 10*3/uL (ref 0.0–0.1)
EOS%: 0 % (ref 0.0–7.0)
Eosinophils Absolute: 0 10*3/uL (ref 0.0–0.5)
HEMATOCRIT: 30.6 % — AB (ref 34.8–46.6)
HEMOGLOBIN: 10.2 g/dL — AB (ref 11.6–15.9)
LYMPH#: 0.7 10*3/uL — AB (ref 0.9–3.3)
LYMPH%: 3.8 % — ABNORMAL LOW (ref 14.0–49.7)
MCH: 26.2 pg (ref 25.1–34.0)
MCHC: 33.4 g/dL (ref 31.5–36.0)
MCV: 78.5 fL — ABNORMAL LOW (ref 79.5–101.0)
MONO#: 0.3 10*3/uL (ref 0.1–0.9)
MONO%: 1.4 % (ref 0.0–14.0)
NEUT%: 94.7 % — ABNORMAL HIGH (ref 38.4–76.8)
NEUTROS ABS: 17 10*3/uL — AB (ref 1.5–6.5)
PLATELETS: 301 10*3/uL (ref 145–400)
RBC: 3.9 10*6/uL (ref 3.70–5.45)
RDW: 21.1 % — ABNORMAL HIGH (ref 11.2–14.5)
WBC: 18 10*3/uL — AB (ref 3.9–10.3)

## 2017-04-20 ENCOUNTER — Ambulatory Visit: Payer: Self-pay | Admitting: Genetics

## 2017-04-20 ENCOUNTER — Ambulatory Visit (HOSPITAL_BASED_OUTPATIENT_CLINIC_OR_DEPARTMENT_OTHER): Payer: 59

## 2017-04-20 ENCOUNTER — Encounter: Payer: Self-pay | Admitting: *Deleted

## 2017-04-20 ENCOUNTER — Other Ambulatory Visit: Payer: 59

## 2017-04-20 ENCOUNTER — Ambulatory Visit (HOSPITAL_BASED_OUTPATIENT_CLINIC_OR_DEPARTMENT_OTHER): Payer: 59 | Admitting: Oncology

## 2017-04-20 VITALS — BP 131/70 | HR 73 | Temp 98.2°F | Resp 18 | Ht 66.0 in | Wt 149.2 lb

## 2017-04-20 DIAGNOSIS — Z5112 Encounter for antineoplastic immunotherapy: Secondary | ICD-10-CM

## 2017-04-20 DIAGNOSIS — Z1379 Encounter for other screening for genetic and chromosomal anomalies: Secondary | ICD-10-CM

## 2017-04-20 DIAGNOSIS — Z170 Estrogen receptor positive status [ER+]: Secondary | ICD-10-CM

## 2017-04-20 DIAGNOSIS — Z1589 Genetic susceptibility to other disease: Principal | ICD-10-CM

## 2017-04-20 DIAGNOSIS — C50411 Malignant neoplasm of upper-outer quadrant of right female breast: Secondary | ICD-10-CM

## 2017-04-20 DIAGNOSIS — Z1501 Genetic susceptibility to malignant neoplasm of breast: Secondary | ICD-10-CM

## 2017-04-20 DIAGNOSIS — Z5111 Encounter for antineoplastic chemotherapy: Secondary | ICD-10-CM | POA: Diagnosis not present

## 2017-04-20 DIAGNOSIS — Z1509 Genetic susceptibility to other malignant neoplasm: Principal | ICD-10-CM

## 2017-04-20 DIAGNOSIS — Z1502 Genetic susceptibility to malignant neoplasm of ovary: Principal | ICD-10-CM

## 2017-04-20 LAB — PREGNANCY, URINE: Pregnancy Test, Urine: NEGATIVE

## 2017-04-20 MED ORDER — ACETAMINOPHEN 325 MG PO TABS
650.0000 mg | ORAL_TABLET | Freq: Once | ORAL | Status: AC
  Administered 2017-04-20: 650 mg via ORAL

## 2017-04-20 MED ORDER — DOCETAXEL CHEMO INJECTION 160 MG/16ML
60.0000 mg/m2 | Freq: Once | INTRAVENOUS | Status: AC
  Administered 2017-04-20: 110 mg via INTRAVENOUS
  Filled 2017-04-20: qty 11

## 2017-04-20 MED ORDER — SODIUM CHLORIDE 0.9% FLUSH
10.0000 mL | INTRAVENOUS | Status: DC | PRN
  Administered 2017-04-20: 10 mL
  Filled 2017-04-20: qty 10

## 2017-04-20 MED ORDER — CARBOPLATIN CHEMO INJECTION 600 MG/60ML
553.0000 mg | Freq: Once | INTRAVENOUS | Status: AC
  Administered 2017-04-20: 550 mg via INTRAVENOUS
  Filled 2017-04-20: qty 55

## 2017-04-20 MED ORDER — HEPARIN SOD (PORK) LOCK FLUSH 100 UNIT/ML IV SOLN
500.0000 [IU] | Freq: Once | INTRAVENOUS | Status: AC | PRN
  Administered 2017-04-20: 500 [IU]
  Filled 2017-04-20: qty 5

## 2017-04-20 MED ORDER — TRASTUZUMAB CHEMO 150 MG IV SOLR
6.0000 mg/kg | Freq: Once | INTRAVENOUS | Status: AC
  Administered 2017-04-20: 399 mg via INTRAVENOUS
  Filled 2017-04-20: qty 19

## 2017-04-20 MED ORDER — DIPHENHYDRAMINE HCL 25 MG PO CAPS
25.0000 mg | ORAL_CAPSULE | Freq: Once | ORAL | Status: AC
  Administered 2017-04-20: 25 mg via ORAL

## 2017-04-20 MED ORDER — ACETAMINOPHEN 325 MG PO TABS
ORAL_TABLET | ORAL | Status: AC
  Filled 2017-04-20: qty 2

## 2017-04-20 MED ORDER — PALONOSETRON HCL INJECTION 0.25 MG/5ML
0.2500 mg | Freq: Once | INTRAVENOUS | Status: AC
  Administered 2017-04-20: 0.25 mg via INTRAVENOUS

## 2017-04-20 MED ORDER — SODIUM CHLORIDE 0.9 % IV SOLN
420.0000 mg | Freq: Once | INTRAVENOUS | Status: AC
  Administered 2017-04-20: 420 mg via INTRAVENOUS
  Filled 2017-04-20: qty 14

## 2017-04-20 MED ORDER — DIPHENHYDRAMINE HCL 25 MG PO CAPS
ORAL_CAPSULE | ORAL | Status: AC
  Filled 2017-04-20: qty 1

## 2017-04-20 MED ORDER — SODIUM CHLORIDE 0.9 % IV SOLN
Freq: Once | INTRAVENOUS | Status: AC
  Administered 2017-04-20: 13:00:00 via INTRAVENOUS

## 2017-04-20 MED ORDER — PALONOSETRON HCL INJECTION 0.25 MG/5ML
INTRAVENOUS | Status: AC
  Filled 2017-04-20: qty 5

## 2017-04-20 MED ORDER — DEXAMETHASONE SODIUM PHOSPHATE 10 MG/ML IJ SOLN
10.0000 mg | Freq: Once | INTRAMUSCULAR | Status: AC
  Administered 2017-04-20: 10 mg via INTRAVENOUS

## 2017-04-20 MED ORDER — DEXAMETHASONE SODIUM PHOSPHATE 10 MG/ML IJ SOLN
INTRAMUSCULAR | Status: AC
  Filled 2017-04-20: qty 1

## 2017-04-20 NOTE — Patient Instructions (Signed)
[facility] Discharge Instructions for Patients Receiving Chemotherapy  Today you received the following chemotherapy agents Herceptin/Perjeta/Taxotere/Carboplatin  To help prevent nausea and vomiting after your treatment, we encourage you to take your nausea medication as prescribed.  If you develop nausea and vomiting that is not controlled by your nausea medication, call the clinic.   BELOW ARE SYMPTOMS THAT SHOULD BE REPORTED IMMEDIATELY:  *FEVER GREATER THAN 100.5 F  *CHILLS WITH OR WITHOUT FEVER  NAUSEA AND VOMITING THAT IS NOT CONTROLLED WITH YOUR NAUSEA MEDICATION  *UNUSUAL SHORTNESS OF BREATH  *UNUSUAL BRUISING OR BLEEDING  TENDERNESS IN MOUTH AND THROAT WITH OR WITHOUT PRESENCE OF ULCERS  *URINARY PROBLEMS  *BOWEL PROBLEMS  UNUSUAL RASH Items with * indicate a potential emergency and should be followed up as soon as possible.  Feel free to call the clinic you have any questions or concerns. The clinic phone number is [phone].  Please show the CHEMO ALERT CARD at check-in to the Emergency Department and triage nurse.   

## 2017-04-20 NOTE — Progress Notes (Addendum)
[place]  [phone] [phone]     ID: Olivia Frederick DOB: 11-02-1971  MR#: [id number]  AUQ#:[id number]  Patient Care Team: Carollee Herter, Alferd Apa, DO as PCP - General (Family Medicine) Excell Seltzer, MD as Consulting Physician (General Surgery) Magrinat, Virgie Dad, MD as Consulting Physician (Oncology) Gery Pray, MD as Consulting Physician (Radiation Oncology) Chauncey Cruel, MD OTHER MD:  CHIEF COMPLAINT: Triple positive breast cancer  CURRENT TREATMENT: Neoadjuvant chemotherapy   BREAST CANCER HISTORY: From the original intake note:  Olivia Frederick noted a change in her right breast around November 2017. She was not very concerned about it but did mention it at the time of mammography which had been scheduled for 01/30/2017. Accordingly she had bilateral diagnostic mammography with tomography and right breast ultrasonography that day at the [facility]. In the right breast there was an irregular mass measuring approximately 5 cm. On exam this was firm and palpable at the 12:00 location 2 cm from the nipple targeted ultrasonography showed an area of mixed echogenicity superiorly measuring at least 4.6 cm with a similar appearing adjacent area measuring 1.3 cm thought to be contiguous ultrasound of the right axilla showed one ymph node with cortical thickening.  Biopsy of the right breast mass 01/31/2017 showed ([id number]) invasive ductal carcinoma, grade 2, estrogen receptor 100% positive, with strong staining intensity, progesterone receptor 25% positive, with moderate staining intensity, with an MIB-1 of 25%, and HER-2 amplified with a signals ratio of 2.35, the number per cell being 4.0. Biopsy of the suspicious axillary lymph node showed only reactive  Her subsequent history is as detailed below  INTERVAL HISTORY: Olivia Frederick returns today for follow-up and treatment of her triple positive breast cancer. Today is day 1 cycle 4 of 6  planned cycles of carboplatin, docetaxel, Pertuzumab and trastuzumab. She generally has tolerated the treatments well, without dose reductions or delay.  Echocardiogram 02/13/2017 showed an ejection fraction in the 55-60% range.   REVIEW OF SYSTEMS: Olivia Frederick feels a little bit tired and she says and gets a bad taste in her mouth 2 week after treatment. She gets a little bit of a sore throat which last a few days but she doesn't have frank sores in her mouth or thrush. Otherwise she is doing "great". In particular she has had no diarrhea from the Pertuzumab and no peripheral neuropathy from the docetaxel. A detailed review of systems today was otherwise negative  PAST MEDICAL HISTORY: Past Medical History:  Diagnosis Date  . Cervical dysplasia     PAST SURGICAL HISTORY: Past Surgical History:  Procedure Laterality Date  . BREAST EXCISIONAL BIOPSY Right   . BREAST SURGERY     right breast benign tumor     FAMILY HISTORY Family History  Problem Relation Age of Onset  . Hypertension Mother   . Diabetes Father   . Leukemia Paternal Grandfather        d.64  . Breast cancer Cousin 40       Bilateral breast cancer. Daughter of paternal uncle with leukemia.  . Leukemia Paternal Uncle 42  . Lung cancer Maternal Uncle 74  . Brain cancer Maternal Aunt 65       d.66  . Cancer Maternal Uncle        unspecified type  . Brain cancer Cousin 39       d.55 daughter of maternal aunt with brain cancer  The patient's parents are living, both in their early 57s as of April 2018. She  has one brother and one sister. On the paternal side a grandfather was diagnosed with leukemia at age 75, an uncle also with leukemia at age 9, another uncle at age 38 with colon cancer, and a cousin with breast cancer at age 16. On the mother's side there was an uncle with cancer of some type, not known to the patient, diagnosed age 18   GYNECOLOGIC HISTORY:  No LMP recorded.  Menarche age 54. She is GX P2, first live  birth age 54. As of April 2018 she is having regular periods. She never used oral contraceptives.    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 Son Olivia Frederick is 17, daughter cavity is 92, as of April 2018.     ADVANCED DIRECTIVES: Not in place    HEALTH MAINTENANCE: Social History  Substance Use Topics  . Smoking status: Former Smoker    Quit date: 02/01/2017  . Smokeless tobacco: Never Used  . Alcohol use Yes     Colonoscopy: n/a  PAP:  Bone density:   No Known Allergies  Current Outpatient Prescriptions  Medication Sig Dispense Refill  . acetaminophen (TYLENOL) 325 MG tablet Take 650 mg by mouth as needed.    Marland Kitchen dexamethasone (DECADRON) 4 MG tablet Take 2 tablets (8 mg total) by mouth 2 (two) times daily. Start the day before Taxotere. Then again the day after chemo for 3 days. 30 tablet 1  . diphenhydrAMINE (BENADRYL) 25 mg capsule Take 25 mg by mouth as needed.    . lidocaine-prilocaine (EMLA) cream Apply to affected area once 30 g 3  . LORazepam (ATIVAN) 0.5 MG tablet Take 1 tablet (0.5 mg total) by mouth every 6 (six) hours as needed (Nausea or vomiting). 30 tablet 0  . prochlorperazine (COMPAZINE) 10 MG tablet Take 1 tablet (10 mg total) by mouth every 6 (six) hours as needed (Nausea or vomiting). 30 tablet 1   No current facility-administered medications for this visit.    Facility-Administered Medications Ordered in Other Visits  Medication Dose Route Frequency Provider Last Rate Last Dose  . heparin lock flush 100 unit/mL  500 Units Intracatheter Once PRN Magrinat, Virgie Dad, MD      . sodium chloride flush (NS) 0.9 % injection 10 mL  10 mL Intracatheter PRN Magrinat, Virgie Dad, MD        OBJECTIVE: Young-appearing African-American woman in no acute distress Vitals:   04/20/17 1159  BP: 131/70  Pulse: 73  Resp: 18  Temp: 98.2 F (36.8 C)     Body mass index is 24.08 kg/m.    ECOG FS:0 - Asymptomatic  Sclerae unicteric, pupils round  and equal Oropharynx clear and moist No cervical or supraclavicular adenopathy Lungs no rales or rhonchi Heart regular rate and rhythm Abd soft, nontender, positive bowel sounds MSK no focal spinal tenderness, no upper extremity lymphedema Neuro: nonfocal, well oriented, appropriate affect Breasts: Deferred   LAB RESULTS:  CMP     Component Value Date/Time   NA 141 04/19/2017 1602   K 4.4 04/19/2017 1602   CO2 27 04/19/2017 1602   GLUCOSE 132 04/19/2017 1602   BUN 9.8 04/19/2017 1602   CREATININE 0.8 04/19/2017 1602   CALCIUM 10.4 04/19/2017 1602   PROT 7.8 04/19/2017 1602   ALBUMIN 4.2 04/19/2017 1602   AST 29 04/19/2017 1602   ALT 51 04/19/2017 1602   ALKPHOS 84 04/19/2017 1602   BILITOT 0.43 04/19/2017 1602    No results found  for: TOTALPROTELP, ALBUMINELP, A1GS, A2GS, BETS, BETA2SER, GAMS, MSPIKE, SPEI  No results found for: Nils Pyle, KAPLAMBRATIO  Lab Results  Component Value Date   WBC 18.0 (H) 04/19/2017   NEUTROABS 17.0 (H) 04/19/2017   HGB 10.2 (L) 04/19/2017   HCT 30.6 (L) 04/19/2017   MCV 78.5 (L) 04/19/2017   PLT 301 04/19/2017      Chemistry      Component Value Date/Time   NA 141 04/19/2017 1602   K 4.4 04/19/2017 1602   CO2 27 04/19/2017 1602   BUN 9.8 04/19/2017 1602   CREATININE 0.8 04/19/2017 1602      Component Value Date/Time   CALCIUM 10.4 04/19/2017 1602   ALKPHOS 84 04/19/2017 1602   AST 29 04/19/2017 1602   ALT 51 04/19/2017 1602   BILITOT 0.43 04/19/2017 1602       No results found for: LABCA2  No components found for: [id number]  No results for input(s): INR in the last 168 hours.  Urinalysis    Component Value Date/Time   COLORURINE yellow 01/22/2010 1457   APPEARANCEUR Clear 01/22/2010 1457   LABSPEC 1.015 01/22/2010 1457   PHURINE 5.0 01/22/2010 1457   GLUCOSEU NEGATIVE 05/24/2007 1404   HGBUR trace-lysed 01/22/2010 1457   BILIRUBINUR negative 01/22/2010 [place] 05/24/2007 1404    PROTEINUR NEGATIVE 05/24/2007 1404   UROBILINOGEN 0.2 01/22/2010 1457   NITRITE negative 01/22/2010 1457   LEUKOCYTESUR  05/24/2007 1404    NEGATIVE MICROSCOPIC NOT DONE ON URINES WITH NEGATIVE PROTEIN, BLOOD, LEUKOCYTES, NITRITE, OR GLUCOSE <1000 mg/dL.     STUDIES: Pregnancy test 04/19/2017 negative  ELIGIBLE FOR AVAILABLE RESEARCH PROTOCOL: No  ASSESSMENT: 46 y.o.  [place] woman status post right breast biopsy 02/01/2017 for a cT2 pN0, stage IB invasive ductal carcinoma, grade 2, estrogen and progesterone receptor positive, with an MIB-1 of 25%, and HER-2 amplified   (a) biopsy of a suspicious right axillary lymph node 01/24/2017 showed only reactive lymphoid tissue  (b) biopsy of right breast lower outer quadrant 02/17/2017 showed invasive ductal carcinoma, grade 3, estrogen and progesterone receptor positive, HER-2 not amplified, with an Mib-1 of 50%  (1) genetics testingommon through the Hereditary Cancers Panel offered by [facility]. 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 This panel includes analysis of the following 46 genes: APC, ATM, AXIN2, BARD1, BMPR1A, BRCA1, BRCA2, BRIP1, CDH1, CDKN2A, CHEK2, CTNNA1, DICER1, EPCAM, GREM1, HOXB13, KIT, MEN1, MLH1, MSH2, MSH3, MSH6, MUTYH, NBN, NF1, NTHL1, PALB2, PDGFRA, PMS2, POLD1, POLE, PTEN, RAD50, RAD51C, RAD51D, SDHA, SDHB, SDHC, SDHD, SMAD4, SMARCA4, STK11, TP53, TSC1, TSC2, and VHL.   (2) neoadjuvant chemotherapy will consist of carboplatin, docetaxel, and trastuzumab with pertuzumab given every 3 weeks 6, starting 02/16/2017   (3) anti-HER-2 immunotherapy to continue to total 1 year  (a) baseline echocardiogram 02/13/2017 found an ejection fraction of 55-60%  (4) definitive surgery to follow at the completion of chemotherapy  (5) adjuvant radiation as appropriate  (6) anti-estrogens to follow the completion of local treatment   PLAN: [place] continues to tolerate her chemotherapy  remarkably well and she maintains a functional status of 100%.  She is proceeding to the fourth of 6 planned cycles today.  She does not feel it is necessary or useful for [place] to see her on the "off" week but she certainly knows to call us if any problems were to develop.  She is running out of medication time and she would like to move her MRI up  to July 17 or so so that when she sees Dr Excell Seltzer on July 27 he will be able to make a definitive surgical plan. We are trying to arrange that for her.  Otherwise she will see me again in 3 weeks. She knows to call for any problems that may develop before that visit.Marland Kitchen   Chauncey Cruel, MD   04/20/2017 12:06 PM Medical Oncology and Hematology [facility] [address] [place], [place] [address] Tel. [phone]    Fax. [phone]

## 2017-04-22 ENCOUNTER — Ambulatory Visit (HOSPITAL_BASED_OUTPATIENT_CLINIC_OR_DEPARTMENT_OTHER): Payer: 59

## 2017-04-22 VITALS — BP 127/65 | HR 67 | Temp 98.0°F | Resp 18

## 2017-04-22 DIAGNOSIS — Z170 Estrogen receptor positive status [ER+]: Principal | ICD-10-CM

## 2017-04-22 DIAGNOSIS — C50411 Malignant neoplasm of upper-outer quadrant of right female breast: Secondary | ICD-10-CM | POA: Diagnosis not present

## 2017-04-22 DIAGNOSIS — Z5189 Encounter for other specified aftercare: Secondary | ICD-10-CM | POA: Diagnosis not present

## 2017-04-22 MED ORDER — PEGFILGRASTIM INJECTION 6 MG/0.6ML ~~LOC~~
6.0000 mg | PREFILLED_SYRINGE | Freq: Once | SUBCUTANEOUS | Status: AC
  Administered 2017-04-22: 6 mg via SUBCUTANEOUS

## 2017-04-22 NOTE — Patient Instructions (Signed)
Pegfilgrastim injection What is this medicine? PEGFILGRASTIM (PEG fil gra stim) is a long-acting granulocyte colony-stimulating factor that stimulates the growth of neutrophils, a type of white blood cell important in the body's fight against infection. It is used to reduce the incidence of fever and infection in patients with certain types of cancer who are receiving chemotherapy that affects the bone marrow, and to increase survival after being exposed to high doses of radiation. This medicine may be used for other purposes; ask your health care provider or pharmacist if you have questions. COMMON BRAND NAME(S): Neulasta What should I tell my health care provider before I take this medicine? They need to know if you have any of these conditions: -kidney disease -latex allergy -ongoing radiation therapy -sickle cell disease -skin reactions to acrylic adhesives (On-Body Injector only) -an unusual or allergic reaction to pegfilgrastim, filgrastim, other medicines, foods, dyes, or preservatives -pregnant or trying to get pregnant -breast-feeding How should I use this medicine? This medicine is for injection under the skin. If you get this medicine at home, you will be taught how to prepare and give the pre-filled syringe or how to use the On-body Injector. Refer to the patient Instructions for Use for detailed instructions. Use exactly as directed. Tell your healthcare provider immediately if you suspect that the On-body Injector may not have performed as intended or if you suspect the use of the On-body Injector resulted in a missed or partial dose. It is important that you put your used needles and syringes in a special sharps container. Do not put them in a trash can. If you do not have a sharps container, call your pharmacist or healthcare provider to get one. Talk to your pediatrician regarding the use of this medicine in children. While this drug may be prescribed for selected conditions,  precautions do apply. Overdosage: If you think you have taken too much of this medicine contact a poison control center or emergency room at once. NOTE: This medicine is only for you. Do not share this medicine with others. What if I miss a dose? It is important not to miss your dose. Call your doctor or health care professional if you miss your dose. If you miss a dose due to an On-body Injector failure or leakage, a new dose should be administered as soon as possible using a single prefilled syringe for manual use. What may interact with this medicine? Interactions have not been studied. Give your health care provider a list of all the medicines, herbs, non-prescription drugs, or dietary supplements you use. Also tell them if you smoke, drink alcohol, or use illegal drugs. Some items may interact with your medicine. This list may not describe all possible interactions. Give your health care provider a list of all the medicines, herbs, non-prescription drugs, or dietary supplements you use. Also tell them if you smoke, drink alcohol, or use illegal drugs. Some items may interact with your medicine. What should I watch for while using this medicine? You may need blood work done while you are taking this medicine. If you are going to need a MRI, CT scan, or other procedure, tell your doctor that you are using this medicine (On-Body Injector only). What side effects may I notice from receiving this medicine? Side effects that you should report to your doctor or health care professional as soon as possible: -allergic reactions like skin rash, itching or hives, swelling of the face, lips, or tongue -dizziness -fever -pain, redness, or irritation at site   where injected -pinpoint red spots on the skin -red or dark-brown urine -shortness of breath or breathing problems -stomach or side pain, or pain at the shoulder -swelling -tiredness -trouble passing urine or change in the amount of urine Side  effects that usually do not require medical attention (report to your doctor or health care professional if they continue or are bothersome): -bone pain -muscle pain This list may not describe all possible side effects. Call your doctor for medical advice about side effects. You may report side effects to FDA at 1-800-FDA-1088. Where should I keep my medicine? Keep out of the reach of children. Store pre-filled syringes in a refrigerator between 2 and 8 degrees C (36 and 46 degrees F). Do not freeze. Keep in carton to protect from light. Throw away this medicine if it is left out of the refrigerator for more than 48 hours. Throw away any unused medicine after the expiration date. NOTE: This sheet is a summary. It may not cover all possible information. If you have questions about this medicine, talk to your doctor, pharmacist, or health care provider.  2018 Elsevier/Gold Standard (2016-10-20 12:58:03)  

## 2017-04-24 ENCOUNTER — Encounter: Payer: Self-pay | Admitting: Genetics

## 2017-04-24 DIAGNOSIS — Z1589 Genetic susceptibility to other disease: Principal | ICD-10-CM

## 2017-04-24 DIAGNOSIS — Z1501 Genetic susceptibility to malignant neoplasm of breast: Secondary | ICD-10-CM | POA: Insufficient documentation

## 2017-04-24 DIAGNOSIS — Z1502 Genetic susceptibility to malignant neoplasm of ovary: Principal | ICD-10-CM

## 2017-04-24 DIAGNOSIS — Z1379 Encounter for other screening for genetic and chromosomal anomalies: Secondary | ICD-10-CM

## 2017-04-24 DIAGNOSIS — Z1509 Genetic susceptibility to other malignant neoplasm: Principal | ICD-10-CM

## 2017-04-24 HISTORY — DX: Encounter for other screening for genetic and chromosomal anomalies: Z13.79

## 2017-04-24 NOTE — Progress Notes (Signed)
GENETIC TEST RESULTS  Patient Name: Olivia Frederick Patient Age: 46 y.o. Encounter Date: 04/20/2017  Referring Provider: Lurline Del, MD  Olivia Frederick was seen in the [place] clinic on 03/08/2017 due to a personal and family history of cancer and concern regarding a hereditary predisposition to cancer in the family. Please refer to the prior Genetics clinic note for more information regarding Olivia Frederick's medical and family histories and our assessment at the time.   FAMILY HISTORY:  We obtained a detailed, 4-generation family history.  Significant diagnoses are listed below: Family History  Problem Relation Age of Onset  . Hypertension Mother   . Diabetes Father   . Leukemia Paternal Grandfather        d.64  . Breast cancer Cousin 40       Bilateral breast cancer. Daughter of paternal uncle with leukemia.  . Leukemia Paternal Uncle 88  . Lung cancer Maternal Uncle 74  . Brain cancer Maternal Aunt 65       d.66  . Cancer Maternal Uncle        unspecified type  . Brain cancer Cousin 59       d.55 daughter of maternal aunt with brain cancer   Olivia Frederick has a son and daughter, ages 36 and 29, who are healthy. She has a brother, age 50, and sister, age 44, who have no histories of cancer. Olivia Frederick parents are 54 and 74. Neither have had cancers.  Olivia Frederick has one maternal aunt who lived to adulthood. This aunt died at 77 from a brain tumor that was diagnosed at age 106. This aunt had three daughters. One of those daughters also died from a brain tumor at age 63. Olivia Frederick has four maternal uncles who lived to adulthood. One uncle died at 82 from lung cancer; another uncle died at an unknown age from an unspecified form of cancer; a third uncle died in his 67s in a car accident; the fourth uncle died in his early-60s from a heart attack. Olivia Frederick also had another maternal aunt and uncle who died as children from  illness/fever. Her maternal grandfather died in his 55s of unknown cause. Her maternal grandmother died at 4 without cancer.  Olivia Frederick has two paternal aunts and two paternal uncles who are all living and in their 24s. One uncle has leukemia; the other uncle has an unspecified form of cancer. Olivia Frederick's paternal aunts do not have cancers, but one has a daughter who was diagnosed with bilateral breast cancer last year at age 25. This cousin reports that she had genetic testing and was negative. Olivia Frederick other paternal aunt has a daughter with either blood or bone cancer. Her paternal grandfather died at 55 with leukemia. Her paternal grandmother died at 65 without cancers.  Olivia Frederick maternal and paternal ancestors are of African American/black descent. There is no reported Ashkenazi Jewish ancestry. There is no known consanguinity.  GENETIC TESTING: At the time of Olivia Frederick's visit, we recommended she pursue genetic testing of the 46-gene Common Hereditary Cancer Panel offered by [facility]. 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   There were no additional mutations in the remaining 45 genes analyzed: APC, ATM, AXIN2, BARD1, BMPR1A, BRCA1, BRCA2, BRIP1, CDH1, CDKN2A, CTNNA1, DICER1, EPCAM, GREM1, HOXB13, KIT, MEN1, MLH1, MSH2, MSH3, MSH6, MUTYH, NBN, NF1, NTHL1, PALB2, PDGFRA, PMS2, POLD1, POLE, PTEN, RAD50, RAD51C, RAD51D, SDHA, SDHB, SDHC, SDHD, SMAD4, SMARCA4, STK11, TP53, TSC1,  TSC2, and VHL.  A portion of Olivia Frederick's result report is included below for reference. Her result will be scanned to her chart and will appear under Molecular Pathology.   DISCUSSION: CHEK2 mutations have been found to be associated with an increased risk of breast and other cancers. The estimated cancer risks vary widely and may be influenced by family history. Women with a CHEK2 deleterious  mutation have approximately a 24% (no family history of breast cancer) to 48% (strong family history of breast cancer) lifetime risk of breast cancer and up to a 25% risk of a second breast cancer. Men may have an increased risk for female breast cancer of about 1%. Men and women may have an increased risk of colon cancer (~10% lifetime risk). According to the NCCN guidelines, individuals with CHEK2 mutations should consider breast MRI's as a part of regular breast cancer screening, and depending on family history could consider a risk-reducing mastectomy.  Breast cancer screening should begin, for women, at age 63 or 39 years younger than the earliest age of onset.  Colon cancer screening should begin at age 29 and continue every 5 years or based on polyp number.    CANCER SCREENING: Current screening guidelines from the [employer]  (NCCN) for those with a pathogenic CHEK2 variant are as follows: Breast cancer:  Annual mammogram with consideration of tomosynthesis; also consider breast MRI with contrast beginning by age 61 or 10 years prior to the earliest age at breast cancer diagnosis in the family.  Evidence of risk-reducing mastectomy is insufficient; manage based on family history (may be considered based on patient preference and family history).  Colon cancer:   Personal history of colon cancer  Follow instructions provided by your physician based on personal history of colon cancer and treatment.   Do not have a personal history of colon cancer but have a parent/sibling/child with colon cancer: Colonoscopy every 5 years starting at age 79 or 11 years younger than the earliest age of onset, whichever is younger.   Do not have a personal history of colon cancer but do not have a parent/sibling/child with colon cancer: Colonoscopy every 5 years starting at age 97.  FAMILY MEMBERS: It is important that all of Olivia Frederick's relatives (both men and women) know  of the presence of this gene mutation.  Women need to know that they may be at increased risk for breast and colon cancers.  Men are at increased risk for colon cancer.  Genetic testing can sort out who in the family is at risk and who is not. We would be happy to help meet with and coordinate genetic testing for any relative that is interested.  Olivia Frederick's children each have a 50% risk to have inherited the mutation found in her. Olivia Frederick children, however, are relatively young and this will not be of any consequence to them for several years. We do not test children under age 9 because there is no known risk to them until they are adults. Therefore, genetic testing is recommended for Olivia Frederick's daughter after age 15 and before age 49 (1 years prior to the age at which Olivia Frederick was diagnosed). Though Ms.  Frederick's son is currently 46, knowledge of whether or not he carries this mutation would not impact his medical care until the age of 73. Therefore, her son could undergo genetic testing at any time, but is recommended prior to age 17. If knowledge of whether Olivia Frederick's children carry  this mutation would affect their reproductive choices, we recommend they test prior to conception of a child.   Olivia Frederick's parents and siblings are at 50% risk to have the CHEK2 mutation found in her. We recommend they have genetic testing for this same mutation, as identifying the presence of this mutation would allow them to also take advantage of risk-reducing measures. Genetic testing is recommended for Olivia Frederick's parents to inform their own cancer risks, but also determine which side of the family (maternal or paternal) is at increased risk for CHEK2-related cancers. We discussed that because there is some Caucasian ancestry on her mother's side of the family, it may make sense to begin by testing Olivia Frederick's mother first. If her mother is  negative, genetic testing for her father is recommended. Until it can be determined which side of the family carries this CHEK2 mutation, Olivia Frederick was encouraged to inform individuals (aunts, uncles, cousins) on both sides of her family of this mutation.  Our knowledge of cancer risks related to CHEK2 mutations will continue to evolve. We recommended that Olivia Frederick follow up with the genetics clinic annually so we can provide her with the most current information about CHEK2 and cancer risk, as well as with any changes to her family history (new cancer diagnoses, genetic test results).  SUPPORT AND RESOURCES:  We provided information about two support groups for hereditary cancer syndrome information and support, Facing Our Risk (www.facingourrisk.com) and Bright Pink (www.brightpink.org) which some people have found useful.  They provide opportunities to speak with other individuals from high-risk families.    Our contact number was provided. Olivia Frederick questions were answered to her satisfaction, and she knows she is welcome to call us at anytime with additional questions or concerns.   Mal Misty, MS, [facility] Certified Naval [email or web address]

## 2017-05-09 ENCOUNTER — Other Ambulatory Visit: Payer: 59

## 2017-05-10 NOTE — Progress Notes (Signed)
[place]  Telephone:([phone]) [phone] [phone]     ID: Olivia Frederick DOB: April 26, 1971  MR#: [id number]  GUY#:[id number]  Patient Care Team: Carollee Herter, Alferd Apa, DO as PCP - General (Family Medicine) Excell Seltzer, MD as Consulting Physician (General Surgery) Magrinat, Virgie Dad, MD as Consulting Physician (Oncology) Gery Pray, MD as Consulting Physician (Radiation Oncology) Chauncey Cruel, MD OTHER MD:  CHIEF COMPLAINT: Triple positive breast cancer  CURRENT TREATMENT: Neoadjuvant chemotherapy   BREAST CANCER HISTORY: From the original intake note:  Tinita noted a change in her right breast around November 2017. She was not very concerned about it but did mention it at the time of mammography which had been scheduled for 01/30/2017. Accordingly she had bilateral diagnostic mammography with tomography and right breast ultrasonography that day at the [facility]. In the right breast there was an irregular mass measuring approximately 5 cm. On exam this was firm and palpable at the 12:00 location 2 cm from the nipple targeted ultrasonography showed an area of mixed echogenicity superiorly measuring at least 4.6 cm with a similar appearing adjacent area measuring 1.3 cm thought to be contiguous ultrasound of the right axilla showed one ymph node with cortical thickening.  Biopsy of the right breast mass 01/31/2017 showed ([id number]) invasive ductal carcinoma, grade 2, estrogen receptor 100% positive, with strong staining intensity, progesterone receptor 25% positive, with moderate staining intensity, with an MIB-1 of 25%, and HER-2 amplified with a signals ratio of 2.35, the number per cell being 4.0. Biopsy of the suspicious axillary lymph node showed only reactive  Her subsequent history is as detailed below  INTERVAL HISTORY: [name] returns today for follow-up and treatment of her triple positive breast cancer. Today is day 1 cycle 5 of 6  planned cycles of carboplatin, docetaxel, trastuzumab and Pertuzumab.  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 This is similar to BRCA mutations in that day place the patient at a higher risk of breast cancers in the future. There is also a concern regarding colon cancer developing. Her options to decrease the risk are discussed today.   REVIEW OF SYSTEMS: Olivia Frederick had very minimal numbness in her finger pads very briefly and it has completely resolved. There is none at present today. The only other area of concern is her right big toe. It feels a little [employer], but she has no symptoms in any other toe in either foot. She did develop diarrhea, over the past week, up to 3-4 times a day. She has some darkening of her skin particularly in the face which is difficult for her. She has some itchiness at the port site. Aside from this she remains extremely active and a detailed review of systems today was noncontributory  PAST MEDICAL HISTORY: Past Medical History:  Diagnosis Date  . Cervical dysplasia   . Genetic testing 04/24/2017   [name] underwent genetic counseling and testing for hereditary cancer syndromes on 03/08/2017. 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   The remaining 45 genes analyzed by [facility]'s 46-gene Common Hereditary Cancers Panel were negative for mutations. Genes analyzed include: APC, ATM, AXIN2, BARD1, BMPR1A, BRCA1, BRCA2, BRIP1, CDH1, CDKN2    PAST SURGICAL HISTORY: Past Surgical History:  Procedure Laterality Date  . BREAST EXCISIONAL BIOPSY Right   . BREAST SURGERY     right breast benign tumor     FAMILY HISTORY Family History  Problem Relation Age  of Onset  . Hypertension Mother   . Diabetes Father   . Leukemia Paternal Grandfather        d.64  . Breast cancer Cousin 40       Bilateral breast cancer. Daughter of paternal uncle with leukemia.  .  Leukemia Paternal Uncle 28  . Lung cancer Maternal Uncle 74  . Brain cancer Maternal Aunt 65       d.66  . Cancer Maternal Uncle        unspecified type  . Brain cancer Cousin 35       d.55 daughter of maternal aunt with brain cancer  The patient's parents are living, both in their early 57s as of April 2018. She has one brother and one sister. On the paternal side a grandfather was diagnosed with leukemia at age 82, an uncle also with leukemia at age 69, another uncle at age 63 with colon cancer, and a cousin with breast cancer at age 59. On the mother's side there was an uncle with cancer of some type, not known to the patient, diagnosed age 20   GYNECOLOGIC HISTORY:  No LMP recorded.  Menarche age 89. She is GX P2, first live birth age 29. As of April 2018 she is having regular periods. She never used oral contraceptives.    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 Son [name] is 6, daughter cavity is 31, as of April 2018.     ADVANCED DIRECTIVES: Not in place    HEALTH MAINTENANCE: Social History  Substance Use Topics  . Smoking status: Former Smoker    Quit date: 02/01/2017  . Smokeless tobacco: Never Used  . Alcohol use Yes     Colonoscopy: n/a  PAP:  Bone density:   No Known Allergies  Current Outpatient Prescriptions  Medication Sig Dispense Refill  . acetaminophen (TYLENOL) 325 MG tablet Take 650 mg by mouth as needed.    Marland Kitchen dexamethasone (DECADRON) 4 MG tablet Take 2 tablets (8 mg total) by mouth 2 (two) times daily. Start the day before Taxotere. Then again the day after chemo for 3 days. 30 tablet 1  . diphenhydrAMINE (BENADRYL) 25 mg capsule Take 25 mg by mouth as needed.    . lidocaine-prilocaine (EMLA) cream Apply to affected area once 30 g 3  . LORazepam (ATIVAN) 0.5 MG tablet Take 1 tablet (0.5 mg total) by mouth every 6 (six) hours as needed (Nausea or vomiting). 30 tablet 0  . prochlorperazine (COMPAZINE) 10 MG tablet Take 1  tablet (10 mg total) by mouth every 6 (six) hours as needed (Nausea or vomiting). 30 tablet 1   No current facility-administered medications for this visit.    Facility-Administered Medications Ordered in Other Visits  Medication Dose Route Frequency Provider Last Rate Last Dose  . heparin lock flush 100 unit/mL  500 Units Intracatheter Once PRN Magrinat, Virgie Dad, MD      . sodium chloride flush (NS) 0.9 % injection 10 mL  10 mL Intracatheter PRN Magrinat, Virgie Dad, MD        OBJECTIVE:  Vitals:   05/11/17 1053  BP: (!) 146/73  Pulse: 81  Resp: 18  Temp: 97.7 F (36.5 C)     Body mass index is 23.98 kg/m.    Filed Weights   05/11/17 1053  Weight: 148 lb 9.6 oz (67.4 kg)     ECOG FS:1 - Symptomatic but completely ambulatory   Sclerae unicteric, pupils round and  equal Oropharynx clear and moist No cervical or supraclavicular adenopathy Lungs no rales or rhonchi Heart regular rate and rhythm Abd soft, nontender, positive bowel sounds MSK no focal spinal tenderness, no upper extremity lymphedema Neuro: nonfocal, well oriented, appropriate affect Breasts: There is still a minimally palpable irregularity in the superior slightly lateral aspect of the areolar region of the right breast. There are no skin changes or nipple retraction. The left breast is unremarkable. Both axillae are benign.    LAB RESULTS:  CMP     Component Value Date/Time   NA 141 05/11/2017 1042   K 3.9 05/11/2017 1042   CO2 28 05/11/2017 1042   GLUCOSE 116 05/11/2017 1042   BUN 9.8 05/11/2017 1042   CREATININE 0.8 05/11/2017 1042   CALCIUM 10.2 05/11/2017 1042   PROT 7.3 05/11/2017 1042   ALBUMIN 4.0 05/11/2017 1042   AST 14 05/11/2017 1042   ALT 15 05/11/2017 1042   ALKPHOS 69 05/11/2017 1042   BILITOT 0.47 05/11/2017 1042    No results found for: TOTALPROTELP, ALBUMINELP, A1GS, A2GS, BETS, BETA2SER, GAMS, MSPIKE, SPEI  No results found for: KPAFRELGTCHN, LAMBDASER, KAPLAMBRATIO  Lab  Results  Component Value Date   WBC 11.7 (H) 05/11/2017   NEUTROABS 8.7 (H) 05/11/2017   HGB 9.5 (L) 05/11/2017   HCT 28.2 (L) 05/11/2017   MCV 80.5 05/11/2017   PLT 281 05/11/2017      Chemistry      Component Value Date/Time   NA 141 05/11/2017 1042   K 3.9 05/11/2017 1042   CO2 28 05/11/2017 1042   BUN 9.8 05/11/2017 1042   CREATININE 0.8 05/11/2017 1042      Component Value Date/Time   CALCIUM 10.2 05/11/2017 1042   ALKPHOS 69 05/11/2017 1042   AST 14 05/11/2017 1042   ALT 15 05/11/2017 1042   BILITOT 0.47 05/11/2017 1042       No results found for: LABCA2  No components found for: [id number]  No results for input(s): INR in the last 168 hours.  Urinalysis    Component Value Date/Time   COLORURINE yellow 01/22/2010 1457   APPEARANCEUR Clear 01/22/2010 1457   LABSPEC 1.015 01/22/2010 1457   PHURINE 5.0 01/22/2010 1457   GLUCOSEU NEGATIVE 05/24/2007 1404   HGBUR trace-lysed 01/22/2010 1457   BILIRUBINUR negative 01/22/2010 [place] 05/24/2007 1404   PROTEINUR NEGATIVE 05/24/2007 1404   UROBILINOGEN 0.2 01/22/2010 1457   NITRITE negative 01/22/2010 1457   LEUKOCYTESUR  05/24/2007 1404    NEGATIVE MICROSCOPIC NOT DONE ON URINES WITH NEGATIVE PROTEIN, BLOOD, LEUKOCYTES, NITRITE, OR GLUCOSE <1000 mg/dL.     STUDIES: Bilateral breast MRI with and without contrast scheduled for 05/23/2017.  ELIGIBLE FOR AVAILABLE RESEARCH PROTOCOL: No  ASSESSMENT: 46 y.o.  [place] woman status post right breast biopsy 02/01/2017 for a cT2 pN0, stage IB invasive ductal carcinoma, grade 2, estrogen and progesterone receptor positive, with an MIB-1 of 25%, and HER-2 amplified   (a) biopsy of a suspicious right axillary lymph node 01/24/2017 showed only reactive lymphoid tissue  (b) biopsy of right breast lower outer quadrant 02/17/2017 showed invasive ductal carcinoma, grade 3, estrogen and progesterone receptor positive, HER-2 not amplified, with an Mib-1 of  50%  (1) genetics testing 46-gene Common Hereditary Cancer Panel offered by [facility] identified a single, heterozygous pathogenic gene mutation called CHEK2, c.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 There were no additional mutations in the remaining 45 genes analyzed: APC, ATM, AXIN2, BARD1, BMPR1A, BRCA1, BRCA2, BRIP1, CDH1, CDKN2A, CTNNA1, DICER1, EPCAM,  GREM1, HOXB13, KIT, MEN1, MLH1, MSH2, MSH3, MSH6, MUTYH, NBN, NF1, NTHL1, PALB2, PDGFRA, PMS2, POLD1, POLE, PTEN, RAD50, RAD51C, RAD51D, SDHA, SDHB, SDHC, SDHD, SMAD4, SMARCA4, STK11, TP53, TSC1, TSC2, and VHL.  (a) will need either bilateral mastectomies or intensified screening  (b) colon cancer screening to begin at age 26 and repeated every 5 years (10% lifetime risk).  (2) neoadjuvant chemotherapy will consist of carboplatin, docetaxel, and trastuzumab with pertuzumab given every 3 weeks 6, starting 02/16/2017   (3) anti-HER-2 immunotherapy to continue to total 1 year  (a) baseline echocardiogram 02/13/2017 found an ejection fraction of 55-60%  (4) definitive surgery to follow at the completion of chemotherapy  (5) adjuvant radiation as appropriate  (6) anti-estrogens to follow the completion of local treatment   PLAN: Jalayna is Proceeding to her fifth of 6 planned cycles of chemotherapy/immunotherapy today. I am omitting the progenitor because of her diarrhea. We will consider reinstating it at her sixth and final cycle.  We discussed her CHEK2 mutation at length today. She understands this does increase to some extent her risk of developing another breast cancer in either breast in the future. That risk could be as high as 25%. That does mean that 3 out of 4 women like her would not be expected to develop breast cancer.  She had assumed that her only choice was to have bilateral mastectomies. However we can also do intensified screening, with yearly mammography followed by yearly MRI at six-month intervals. This is equally safe.  However for  a younger patient to have to undergo imaging every 6 months for an indeterminate period of time, certainly about 10 years at a minimum (until her breast density decreases) seems onerous.  At this point she feels that if she is going to need a right mastectomy in any case she would certainly have bilateral mastectomies. Otherwise she is not quite sure which way to go. She would like to discuss this further with Dr. Excell Seltzer and to facilitate that we are setting her up for an MRI of the breast 05/23/2017.  It would be helpful if she could meet with a plastic surgeon before she meets with Dr. Excell Seltzer so she can understand what involving reconstruction. I have sent him a note regarding that  Otherwise she will return to see me in 3 weeks with her final cycle. If she has any peripheral neuropathy at that point we will omit the docetaxel and consider gemcitabine for the final dose  Chauncey Cruel, MD   05/11/2017 11:36 AM Medical Oncology and Hematology [facility] [place], [place] [address] Tel. [phone]    Fax. [phone]

## 2017-05-11 ENCOUNTER — Encounter: Payer: Self-pay | Admitting: *Deleted

## 2017-05-11 ENCOUNTER — Other Ambulatory Visit (HOSPITAL_COMMUNITY)
Admission: RE | Admit: 2017-05-11 | Discharge: 2017-05-11 | Disposition: A | Discharge status: Home / Self Care | Payer: 59 | Source: Ambulatory Visit | Attending: Oncology | Admitting: Oncology

## 2017-05-11 ENCOUNTER — Ambulatory Visit (HOSPITAL_BASED_OUTPATIENT_CLINIC_OR_DEPARTMENT_OTHER): Payer: 59

## 2017-05-11 ENCOUNTER — Other Ambulatory Visit: Payer: 59

## 2017-05-11 ENCOUNTER — Other Ambulatory Visit (HOSPITAL_BASED_OUTPATIENT_CLINIC_OR_DEPARTMENT_OTHER): Payer: 59

## 2017-05-11 ENCOUNTER — Ambulatory Visit (HOSPITAL_BASED_OUTPATIENT_CLINIC_OR_DEPARTMENT_OTHER): Payer: 59 | Admitting: Oncology

## 2017-05-11 VITALS — BP 146/73 | HR 81 | Temp 97.7°F | Resp 18 | Ht 66.0 in | Wt 148.6 lb

## 2017-05-11 DIAGNOSIS — Z170 Estrogen receptor positive status [ER+]: Principal | ICD-10-CM

## 2017-05-11 DIAGNOSIS — C50411 Malignant neoplasm of upper-outer quadrant of right female breast: Secondary | ICD-10-CM

## 2017-05-11 DIAGNOSIS — Z1589 Genetic susceptibility to other disease: Secondary | ICD-10-CM

## 2017-05-11 DIAGNOSIS — C50511 Malignant neoplasm of lower-outer quadrant of right female breast: Secondary | ICD-10-CM

## 2017-05-11 DIAGNOSIS — Z1501 Genetic susceptibility to malignant neoplasm of breast: Secondary | ICD-10-CM | POA: Diagnosis not present

## 2017-05-11 DIAGNOSIS — Z1509 Genetic susceptibility to other malignant neoplasm: Secondary | ICD-10-CM

## 2017-05-11 DIAGNOSIS — Z5111 Encounter for antineoplastic chemotherapy: Secondary | ICD-10-CM

## 2017-05-11 DIAGNOSIS — Z5112 Encounter for antineoplastic immunotherapy: Secondary | ICD-10-CM

## 2017-05-11 DIAGNOSIS — Z1502 Genetic susceptibility to malignant neoplasm of ovary: Secondary | ICD-10-CM

## 2017-05-11 LAB — COMPREHENSIVE METABOLIC PANEL
ALT: 15 U/L (ref 0–55)
ANION GAP: 10 meq/L (ref 3–11)
AST: 14 U/L (ref 5–34)
Albumin: 4 g/dL (ref 3.5–5.0)
Alkaline Phosphatase: 69 U/L (ref 40–150)
BUN: 9.8 mg/dL (ref 7.0–26.0)
CALCIUM: 10.2 mg/dL (ref 8.4–10.4)
CHLORIDE: 104 meq/L (ref 98–109)
CO2: 28 mEq/L (ref 22–29)
Creatinine: 0.8 mg/dL (ref 0.6–1.1)
EGFR: >90 mL/min/{1.73_m2} (ref >90)
Glucose: 116 mg/dl (ref 70–140)
POTASSIUM: 3.9 meq/L (ref 3.5–5.1)
Sodium: 141 mEq/L (ref 136–145)
Total Bilirubin: 0.47 mg/dL (ref 0.20–1.20)
Total Protein: 7.3 g/dL (ref 6.4–8.3)

## 2017-05-11 LAB — CBC WITH DIFFERENTIAL/PLATELET
BASO%: 0.2 % (ref 0.0–2.0)
Basophils Absolute: 0 10*3/uL (ref 0.0–0.1)
EOS%: 0 % (ref 0.0–7.0)
Eosinophils Absolute: 0 10*3/uL (ref 0.0–0.5)
HCT: 28.2 % — ABNORMAL LOW (ref 34.8–46.6)
HEMOGLOBIN: 9.5 g/dL — AB (ref 11.6–15.9)
LYMPH%: 13.1 % — AB (ref 14.0–49.7)
MCH: 27.3 pg (ref 25.1–34.0)
MCHC: 33.8 g/dL (ref 31.5–36.0)
MCV: 80.5 fL (ref 79.5–101.0)
MONO#: 1.4 10*3/uL — AB (ref 0.1–0.9)
MONO%: 12.1 % (ref 0.0–14.0)
NEUT%: 74.6 % (ref 38.4–76.8)
NEUTROS ABS: 8.7 10*3/uL — AB (ref 1.5–6.5)
PLATELETS: 281 10*3/uL (ref 145–400)
RBC: 3.5 10*6/uL — AB (ref 3.70–5.45)
RDW: 22 % — AB (ref 11.2–14.5)
WBC: 11.7 10*3/uL — AB (ref 3.9–10.3)
lymph#: 1.5 10*3/uL (ref 0.9–3.3)

## 2017-05-11 LAB — PREGNANCY, URINE: PREG TEST UR: NEGATIVE

## 2017-05-11 MED ORDER — ACETAMINOPHEN 325 MG PO TABS
650.0000 mg | ORAL_TABLET | Freq: Once | ORAL | Status: AC
  Administered 2017-05-11: 650 mg via ORAL

## 2017-05-11 MED ORDER — SODIUM CHLORIDE 0.9 % IV SOLN
6.0000 mg/kg | Freq: Once | INTRAVENOUS | Status: AC
  Administered 2017-05-11: 399 mg via INTRAVENOUS
  Filled 2017-05-11: qty 19

## 2017-05-11 MED ORDER — PALONOSETRON HCL INJECTION 0.25 MG/5ML
INTRAVENOUS | Status: AC
  Filled 2017-05-11: qty 5

## 2017-05-11 MED ORDER — SODIUM CHLORIDE 0.9 % IV SOLN: Freq: Once | INTRAVENOUS | Status: DC

## 2017-05-11 MED ORDER — ACETAMINOPHEN 325 MG PO TABS
ORAL_TABLET | ORAL | Status: AC
  Filled 2017-05-11: qty 2

## 2017-05-11 MED ORDER — SODIUM CHLORIDE 0.9% FLUSH
10.0000 mL | INTRAVENOUS | Status: DC | PRN
  Administered 2017-05-11: 10 mL
  Filled 2017-05-11: qty 10

## 2017-05-11 MED ORDER — HEPARIN SOD (PORK) LOCK FLUSH 100 UNIT/ML IV SOLN
500.0000 [IU] | Freq: Once | INTRAVENOUS | Status: AC | PRN
Start: 2017-05-11 — End: 2017-05-11
  Administered 2017-05-11: 500 [IU]
  Filled 2017-05-11: qty 5

## 2017-05-11 MED ORDER — DEXAMETHASONE SODIUM PHOSPHATE 10 MG/ML IJ SOLN
10.0000 mg | Freq: Once | INTRAMUSCULAR | Status: AC
  Administered 2017-05-11: 10 mg via INTRAVENOUS

## 2017-05-11 MED ORDER — DEXTROSE 5 % IV SOLN
60.0000 mg/m2 | Freq: Once | INTRAVENOUS | Status: AC
  Administered 2017-05-11: 110 mg via INTRAVENOUS
  Filled 2017-05-11: qty 11

## 2017-05-11 MED ORDER — PALONOSETRON HCL INJECTION 0.25 MG/5ML
0.2500 mg | Freq: Once | INTRAVENOUS | Status: AC
  Administered 2017-05-11: 0.25 mg via INTRAVENOUS

## 2017-05-11 MED ORDER — DIPHENHYDRAMINE HCL 25 MG PO CAPS
25.0000 mg | ORAL_CAPSULE | Freq: Once | ORAL | Status: AC
  Administered 2017-05-11: 25 mg via ORAL

## 2017-05-11 MED ORDER — SODIUM CHLORIDE 0.9 % IV SOLN
Freq: Once | INTRAVENOUS | Status: AC
  Administered 2017-05-11: 12:00:00 via INTRAVENOUS

## 2017-05-11 MED ORDER — SODIUM CHLORIDE 0.9 % IV SOLN
553.0000 mg | Freq: Once | INTRAVENOUS | Status: AC
  Administered 2017-05-11: 550 mg via INTRAVENOUS
  Filled 2017-05-11: qty 55

## 2017-05-11 MED ORDER — DEXAMETHASONE SODIUM PHOSPHATE 10 MG/ML IJ SOLN
INTRAMUSCULAR | Status: AC
  Filled 2017-05-11: qty 1

## 2017-05-11 MED ORDER — DIPHENHYDRAMINE HCL 25 MG PO CAPS
ORAL_CAPSULE | ORAL | Status: AC
  Filled 2017-05-11: qty 1

## 2017-05-11 NOTE — Patient Instructions (Signed)
[facility] Discharge Instructions for Patients Receiving Chemotherapy  Today you received the following chemotherapy agents Herceptin/Perjeta/Taxotere/Carboplatin  To help prevent nausea and vomiting after your treatment, we encourage you to take your nausea medication as prescribed.  If you develop nausea and vomiting that is not controlled by your nausea medication, call the clinic.   BELOW ARE SYMPTOMS THAT SHOULD BE REPORTED IMMEDIATELY:  *FEVER GREATER THAN 100.5 F  *CHILLS WITH OR WITHOUT FEVER  NAUSEA AND VOMITING THAT IS NOT CONTROLLED WITH YOUR NAUSEA MEDICATION  *UNUSUAL SHORTNESS OF BREATH  *UNUSUAL BRUISING OR BLEEDING  TENDERNESS IN MOUTH AND THROAT WITH OR WITHOUT PRESENCE OF ULCERS  *URINARY PROBLEMS  *BOWEL PROBLEMS  UNUSUAL RASH Items with * indicate a potential emergency and should be followed up as soon as possible.  Feel free to call the clinic you have any questions or concerns. The clinic phone number is [phone].  Please show the CHEMO ALERT CARD at check-in to the Emergency Department and triage nurse.   

## 2017-05-12 ENCOUNTER — Other Ambulatory Visit: Payer: Self-pay | Admitting: Oncology

## 2017-05-12 ENCOUNTER — Encounter: Payer: Self-pay | Admitting: Oncology

## 2017-05-13 ENCOUNTER — Ambulatory Visit (HOSPITAL_BASED_OUTPATIENT_CLINIC_OR_DEPARTMENT_OTHER): Payer: 59

## 2017-05-13 VITALS — BP 119/66 | HR 80 | Temp 98.0°F | Resp 20

## 2017-05-13 DIAGNOSIS — C50511 Malignant neoplasm of lower-outer quadrant of right female breast: Secondary | ICD-10-CM

## 2017-05-13 DIAGNOSIS — Z5189 Encounter for other specified aftercare: Secondary | ICD-10-CM

## 2017-05-13 DIAGNOSIS — Z170 Estrogen receptor positive status [ER+]: Secondary | ICD-10-CM

## 2017-05-13 DIAGNOSIS — C50411 Malignant neoplasm of upper-outer quadrant of right female breast: Secondary | ICD-10-CM

## 2017-05-13 MED ORDER — PEGFILGRASTIM INJECTION 6 MG/0.6ML ~~LOC~~
6.0000 mg | PREFILLED_SYRINGE | Freq: Once | SUBCUTANEOUS | Status: AC
  Administered 2017-05-13: 6 mg via SUBCUTANEOUS

## 2017-05-13 NOTE — Patient Instructions (Signed)
Pegfilgrastim injection What is this medicine? PEGFILGRASTIM (PEG fil gra stim) is a long-acting granulocyte colony-stimulating factor that stimulates the growth of neutrophils, a type of white blood cell important in the body's fight against infection. It is used to reduce the incidence of fever and infection in patients with certain types of cancer who are receiving chemotherapy that affects the bone marrow, and to increase survival after being exposed to high doses of radiation. This medicine may be used for other purposes; ask your health care provider or pharmacist if you have questions. COMMON BRAND NAME(S): Neulasta What should I tell my health care provider before I take this medicine? They need to know if you have any of these conditions: -kidney disease -latex allergy -ongoing radiation therapy -sickle cell disease -skin reactions to acrylic adhesives (On-Body Injector only) -an unusual or allergic reaction to pegfilgrastim, filgrastim, other medicines, foods, dyes, or preservatives -pregnant or trying to get pregnant -breast-feeding How should I use this medicine? This medicine is for injection under the skin. If you get this medicine at home, you will be taught how to prepare and give the pre-filled syringe or how to use the On-body Injector. Refer to the patient Instructions for Use for detailed instructions. Use exactly as directed. Tell your healthcare provider immediately if you suspect that the On-body Injector may not have performed as intended or if you suspect the use of the On-body Injector resulted in a missed or partial dose. It is important that you put your used needles and syringes in a special sharps container. Do not put them in a trash can. If you do not have a sharps container, call your pharmacist or healthcare provider to get one. Talk to your pediatrician regarding the use of this medicine in children. While this drug may be prescribed for selected conditions,  precautions do apply. Overdosage: If you think you have taken too much of this medicine contact a poison control center or emergency room at once. NOTE: This medicine is only for you. Do not share this medicine with others. What if I miss a dose? It is important not to miss your dose. Call your doctor or health care professional if you miss your dose. If you miss a dose due to an On-body Injector failure or leakage, a new dose should be administered as soon as possible using a single prefilled syringe for manual use. What may interact with this medicine? Interactions have not been studied. Give your health care provider a list of all the medicines, herbs, non-prescription drugs, or dietary supplements you use. Also tell them if you smoke, drink alcohol, or use illegal drugs. Some items may interact with your medicine. This list may not describe all possible interactions. Give your health care provider a list of all the medicines, herbs, non-prescription drugs, or dietary supplements you use. Also tell them if you smoke, drink alcohol, or use illegal drugs. Some items may interact with your medicine. What should I watch for while using this medicine? You may need blood work done while you are taking this medicine. If you are going to need a MRI, CT scan, or other procedure, tell your doctor that you are using this medicine (On-Body Injector only). What side effects may I notice from receiving this medicine? Side effects that you should report to your doctor or health care professional as soon as possible: -allergic reactions like skin rash, itching or hives, swelling of the face, lips, or tongue -dizziness -fever -pain, redness, or irritation at site   where injected -pinpoint red spots on the skin -red or dark-brown urine -shortness of breath or breathing problems -stomach or side pain, or pain at the shoulder -swelling -tiredness -trouble passing urine or change in the amount of urine Side  effects that usually do not require medical attention (report to your doctor or health care professional if they continue or are bothersome): -bone pain -muscle pain This list may not describe all possible side effects. Call your doctor for medical advice about side effects. You may report side effects to FDA at 1-800-FDA-1088. Where should I keep my medicine? Keep out of the reach of children. Store pre-filled syringes in a refrigerator between 2 and 8 degrees C (36 and 46 degrees F). Do not freeze. Keep in carton to protect from light. Throw away this medicine if it is left out of the refrigerator for more than 48 hours. Throw away any unused medicine after the expiration date. NOTE: This sheet is a summary. It may not cover all possible information. If you have questions about this medicine, talk to your doctor, pharmacist, or health care provider.  2018 Elsevier/Gold Standard (2016-10-20 12:58:03)  

## 2017-05-18 ENCOUNTER — Ambulatory Visit: Payer: 59 | Admitting: Oncology

## 2017-05-18 ENCOUNTER — Other Ambulatory Visit: Payer: 59

## 2017-05-23 ENCOUNTER — Encounter (HOSPITAL_COMMUNITY): Payer: Self-pay | Admitting: Cardiology

## 2017-05-23 ENCOUNTER — Ambulatory Visit
Admission: RE | Admit: 2017-05-23 | Discharge: 2017-05-23 | Disposition: A | Discharge status: Home / Self Care | Payer: 59 | Source: Ambulatory Visit | Attending: Oncology | Admitting: Oncology

## 2017-05-23 ENCOUNTER — Ambulatory Visit (HOSPITAL_COMMUNITY)
Admission: RE | Admit: 2017-05-23 | Discharge: 2017-05-23 | Disposition: A | Discharge status: Home / Self Care | Payer: 59 | Source: Ambulatory Visit | Attending: Cardiology | Admitting: Cardiology

## 2017-05-23 ENCOUNTER — Ambulatory Visit (HOSPITAL_BASED_OUTPATIENT_CLINIC_OR_DEPARTMENT_OTHER)
Admission: RE | Admit: 2017-05-23 | Discharge: 2017-05-23 | Disposition: A | Discharge status: Home / Self Care | Payer: 59 | Source: Ambulatory Visit | Attending: Cardiology | Admitting: Cardiology

## 2017-05-23 VITALS — BP 114/65 | HR 78 | Wt 150.8 lb

## 2017-05-23 DIAGNOSIS — C50411 Malignant neoplasm of upper-outer quadrant of right female breast: Secondary | ICD-10-CM | POA: Diagnosis not present

## 2017-05-23 DIAGNOSIS — Z833 Family history of diabetes mellitus: Secondary | ICD-10-CM | POA: Diagnosis not present

## 2017-05-23 DIAGNOSIS — Z87891 Personal history of nicotine dependence: Secondary | ICD-10-CM | POA: Diagnosis not present

## 2017-05-23 DIAGNOSIS — Z8249 Family history of ischemic heart disease and other diseases of the circulatory system: Secondary | ICD-10-CM | POA: Insufficient documentation

## 2017-05-23 DIAGNOSIS — Z79899 Other long term (current) drug therapy: Secondary | ICD-10-CM | POA: Diagnosis not present

## 2017-05-23 DIAGNOSIS — Z170 Estrogen receptor positive status [ER+]: Secondary | ICD-10-CM

## 2017-05-23 MED ORDER — GADOBENATE DIMEGLUMINE 529 MG/ML IV SOLN
13.0000 mL | Freq: Once | INTRAVENOUS | Status: AC | PRN
  Administered 2017-05-23: 13 mL via INTRAVENOUS

## 2017-05-23 NOTE — Patient Instructions (Signed)
Follow up and Echo in 3 months with Dr.McLean

## 2017-05-23 NOTE — Progress Notes (Signed)
Oncology: Dr. Jana Hakim  46 yo with no significant past history presents for cardio-oncology evaluation with triple positive breast cancer.  Breast cancer was diagnosed 3/18, ER+/PR+/HER2+.  She has been getting chemotherapy starting on 02/16/17 with carboplatin, docetaxel, trastuzumab, pertuzumab, planned for 6 cycles. She has 1 cycle to go. She will then continue Herceptin to complete 1 year.  She will have definitive surgery and radiation after chemo.   She has no prior cardiac disease. Uncle with CABG in his 76s, otherwise no heart disease in her family.  She is a prior smoker.  No exertional dyspnea or chest pain, good exercise tolerance.   PMH: 1.  Breast cancer: Diagnosed 3/18, ER+/PR+/HER2+.  She will get chemotherapy starting on 02/16/17 with carboplatin, docetaxel, trastuzumab, pertuzumab x 6 cycles.  She will then continue Herceptin to complete 1 year.  She will have definitive surgery and radiation after chemo.  - Echo (3/18): EF 77-82%, normal diastolic function, GLS -42.3%.  - Echo (7/18): EF 55-60%, GLS -19.7%  Social History   Social History  . Marital status: Married    Spouse name: N/A  . Number of children: N/A  . Years of education: N/A   Occupational History  . Not on file.   Social History Main Topics  . Smoking status: Former Smoker    Quit date: 02/01/2017  . Smokeless tobacco: Never Used  . Alcohol use Yes  . Drug use: No  . Sexual activity: Not on file   Other Topics Concern  . Not on file   Social History Narrative  . No narrative on file   Family History  Problem Relation Age of Onset  . Hypertension Mother   . Diabetes Father   . Leukemia Paternal Grandfather        d.64  . Breast cancer Cousin 40       Bilateral breast cancer. Daughter of paternal uncle with leukemia.  . Leukemia Paternal Uncle 63  . Lung cancer Maternal Uncle 74  . Brain cancer Maternal Aunt 65       d.66  . Cancer Maternal Uncle        unspecified type  . Brain cancer  Cousin 52       d.55 daughter of maternal aunt with brain cancer   ROS: All systems reviewed and negative except as per HPI.   Current Outpatient Prescriptions  Medication Sig Dispense Refill  . acetaminophen (TYLENOL) 325 MG tablet Take 650 mg by mouth as needed.    Marland Kitchen dexamethasone (DECADRON) 4 MG tablet Take 2 tablets (8 mg total) by mouth 2 (two) times daily. Start the day before Taxotere. Then again the day after chemo for 3 days. 30 tablet 1  . diphenhydrAMINE (BENADRYL) 25 mg capsule Take 25 mg by mouth as needed.    . lidocaine-prilocaine (EMLA) cream Apply to affected area once 30 g 3  . LORazepam (ATIVAN) 0.5 MG tablet Take 1 tablet (0.5 mg total) by mouth every 6 (six) hours as needed (Nausea or vomiting). 30 tablet 0  . prochlorperazine (COMPAZINE) 10 MG tablet Take 1 tablet (10 mg total) by mouth every 6 (six) hours as needed (Nausea or vomiting). 30 tablet 1   No current facility-administered medications for this encounter.    Facility-Administered Medications Ordered in Other Encounters  Medication Dose Route Frequency Provider Last Rate Last Dose  . heparin lock flush 100 unit/mL  500 Units Intracatheter Once PRN Magrinat, Virgie Dad, MD      . sodium chloride flush (  NS) 0.9 % injection 10 mL  10 mL Intracatheter PRN Magrinat, Virgie Dad, MD       BP 114/65   Pulse 78   Wt 150 lb 12 oz (68.4 kg)   SpO2 100%   BMI 24.33 kg/m  General: NAD Neck: JVP not elevated, no thyromegaly or thyroid nodule.  Lungs: CTAB CV: Nondisplaced PMI.  Heart regular S1/S2, no S3/S4, no murmur.  No peripheral edema.  No carotid bruit.  Normal pedal pulses.  Abdomen: Soft, nontender, no hepatosplenomegaly, no distention.  Skin: Intact without lesions or rashes.  Neurologic: Alert and oriented x 3.  Psych: Normal affect. Extremities: No clubbing or cyanosis.  HEENT: Normal.   Assessment/Plan: 46 yo with triple positive breast cancer presents for cardio-oncology evaluation.  She will be  getting Herceptin for a total of 1 year.  I reviewed her echo today.  Normal EF and strain pattern.  She will return in 3 months for office visit and repeat echo.   [name] 05/24/2017

## 2017-05-23 NOTE — Progress Notes (Signed)
  Echocardiogram 2D Echocardiogram has been performed.  Rubye Strohmeyer T Nehal Witting 05/23/2017, 2:44 PM

## 2017-05-26 ENCOUNTER — Ambulatory Visit: Payer: 59 | Admitting: Adult Health

## 2017-05-26 ENCOUNTER — Other Ambulatory Visit: Payer: 59

## 2017-05-26 ENCOUNTER — Telehealth: Payer: Self-pay | Admitting: *Deleted

## 2017-05-26 NOTE — Telephone Encounter (Signed)
Pt left vm with c/o numbness in her toes and fingertips. 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 Informed that I will leave that msg for Dr. Jana Hakim and to discuss that with him at her next office  Visit on 7/26. Contact information provided.

## 2017-05-29 ENCOUNTER — Other Ambulatory Visit: Payer: Self-pay | Admitting: Oncology

## 2017-05-29 NOTE — Progress Notes (Unsigned)
[facility]  [phone] [phone]     ID: Olivia Frederick DOB: 06/30/1971  MR#: [id number]  IHK#:[id number]  Patient Care Team: Carollee Herter, Alferd Apa, DO as PCP - General (Family Medicine) Excell Seltzer, MD as Consulting Physician (General Surgery) Roselina Burgueno, Virgie Dad, MD as Consulting Physician (Oncology) Gery Pray, MD as Consulting Physician (Radiation Oncology) Chauncey Cruel, MD OTHER MD:  CHIEF COMPLAINT: Triple positive breast cancer  CURRENT TREATMENT: Neoadjuvant chemotherapy   BREAST CANCER HISTORY: From the original intake note:  Karysa noted a change in her right breast around November 2017. She was not very concerned about it but did mention it at the time of mammography which had been scheduled for 01/30/2017. Accordingly she had bilateral diagnostic mammography with tomography and right breast ultrasonography that day at the [facility]. In the right breast there was an irregular mass measuring approximately 5 cm. On exam this was firm and palpable at the 12:00 location 2 cm from the nipple targeted ultrasonography showed an area of mixed echogenicity superiorly measuring at least 4.6 cm with a similar appearing adjacent area measuring 1.3 cm thought to be contiguous ultrasound of the right axilla showed one ymph node with cortical thickening.  Biopsy of the right breast mass 01/31/2017 showed ([id number]) invasive ductal carcinoma, grade 2, estrogen receptor 100% positive, with strong staining intensity, progesterone receptor 25% positive, with moderate staining intensity, with an MIB-1 of 25%, and HER-2 amplified with a signals ratio of 2.35, the number per cell being 4.0. Biopsy of the suspicious axillary lymph node showed only reactive  Her subsequent history is as detailed below  INTERVAL HISTORY: Olivia Frederick returns today for follow-up and treatment of her triple positive breast cancer. Today is day 1 cycle 5 of 6  planned cycles of carboplatin, docetaxel, trastuzumab and Pertuzumab.  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 This is similar to BRCA mutations in that day place the patient at a higher risk of breast cancers in the future. There is also a concern regarding colon cancer developing. Her options to decrease the risk are discussed today.   REVIEW OF SYSTEMS: Olivia Frederick had very minimal numbness in her finger pads very briefly and it has completely resolved. There is none at present today. The only other area of concern is her right big toe. It feels a little [employer], but she has no symptoms in any other toe in either foot. She did develop diarrhea, over the past week, up to 3-4 times a day. She has some darkening of her skin particularly in the face which is difficult for her. She has some itchiness at the port site. Aside from this she remains extremely active and a detailed review of systems today was noncontributory  PAST MEDICAL HISTORY: Past Medical History:  Diagnosis Date  . Cervical dysplasia   . Genetic testing 04/24/2017   Olivia Frederick underwent genetic counseling and testing for hereditary cancer syndromes on 03/08/2017. 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   The remaining 45 genes analyzed by [facility]'s 46-gene Common Hereditary Cancers Panel were negative for mutations. Genes analyzed include: APC, ATM, AXIN2, BARD1, BMPR1A, BRCA1, BRCA2, BRIP1, CDH1, CDKN2    PAST SURGICAL HISTORY: Past Surgical History:  Procedure Laterality Date  . BREAST EXCISIONAL BIOPSY Right   . BREAST SURGERY     right breast benign tumor     FAMILY HISTORY Family History  Problem Relation Age  of Onset  . Hypertension Mother   . Diabetes Father   . Leukemia Paternal Grandfather        d.64  . Breast cancer Cousin 40       Bilateral breast cancer. Daughter of paternal uncle with leukemia.  .  Leukemia Paternal Uncle 72  . Lung cancer Maternal Uncle 74  . Brain cancer Maternal Aunt 65       d.66  . Cancer Maternal Uncle        unspecified type  . Brain cancer Cousin 37       d.55 daughter of maternal aunt with brain cancer  The patient's parents are living, both in their early 62s as of April 2018. She has one brother and one sister. On the paternal side a grandfather was diagnosed with leukemia at age 55, an uncle also with leukemia at age 22, another uncle at age 9 with colon cancer, and a cousin with breast cancer at age 9. On the mother's side there was an uncle with cancer of some type, not known to the patient, diagnosed age 68   GYNECOLOGIC HISTORY:  No LMP recorded.  Menarche age 61. She is GX P2, first live birth age 41. As of April 2018 she is having regular periods. She never used oral contraceptives.    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 Son [name] is 74, daughter cavity is 67, as of April 2018.     ADVANCED DIRECTIVES: Not in place    HEALTH MAINTENANCE: Social History  Substance Use Topics  . Smoking status: Former Smoker    Quit date: 02/01/2017  . Smokeless tobacco: Never Used  . Alcohol use Yes     Colonoscopy: n/a  PAP:  Bone density:   No Known Allergies  Current Outpatient Prescriptions  Medication Sig Dispense Refill  . acetaminophen (TYLENOL) 325 MG tablet Take 650 mg by mouth as needed.    Marland Kitchen dexamethasone (DECADRON) 4 MG tablet Take 2 tablets (8 mg total) by mouth 2 (two) times daily. Start the day before Taxotere. Then again the day after chemo for 3 days. 30 tablet 1  . diphenhydrAMINE (BENADRYL) 25 mg capsule Take 25 mg by mouth as needed.    . lidocaine-prilocaine (EMLA) cream Apply to affected area once 30 g 3  . LORazepam (ATIVAN) 0.5 MG tablet Take 1 tablet (0.5 mg total) by mouth every 6 (six) hours as needed (Nausea or vomiting). 30 tablet 0  . prochlorperazine (COMPAZINE) 10 MG tablet Take 1  tablet (10 mg total) by mouth every 6 (six) hours as needed (Nausea or vomiting). 30 tablet 1   No current facility-administered medications for this visit.    Facility-Administered Medications Ordered in Other Visits  Medication Dose Route Frequency Provider Last Rate Last Dose  . heparin lock flush 100 unit/mL  500 Units Intracatheter Once PRN Cassity Christian, Virgie Dad, MD      . sodium chloride flush (NS) 0.9 % injection 10 mL  10 mL Intracatheter PRN Kaidance Pantoja, Virgie Dad, MD        OBJECTIVE:  There were no vitals filed for this visit.   There is no height or weight on file to calculate BMI.    There were no vitals filed for this visit.   ECOG FS:1 - Symptomatic but completely ambulatory   Sclerae unicteric, pupils round and equal Oropharynx clear and moist No cervical or supraclavicular adenopathy Lungs no rales or rhonchi Heart regular rate  and rhythm Abd soft, nontender, positive bowel sounds MSK no focal spinal tenderness, no upper extremity lymphedema Neuro: nonfocal, well oriented, appropriate affect Breasts: There is still a minimally palpable irregularity in the superior slightly lateral aspect of the areolar region of the right breast. There are no skin changes or nipple retraction. The left breast is unremarkable. Both axillae are benign.    LAB RESULTS:  CMP     Component Value Date/Time   NA 141 05/11/2017 1042   K 3.9 05/11/2017 1042   CO2 28 05/11/2017 1042   GLUCOSE 116 05/11/2017 1042   BUN 9.8 05/11/2017 1042   CREATININE 0.8 05/11/2017 1042   CALCIUM 10.2 05/11/2017 1042   PROT 7.3 05/11/2017 1042   ALBUMIN 4.0 05/11/2017 1042   AST 14 05/11/2017 1042   ALT 15 05/11/2017 1042   ALKPHOS 69 05/11/2017 1042   BILITOT 0.47 05/11/2017 1042    No results found for: TOTALPROTELP, ALBUMINELP, A1GS, A2GS, BETS, BETA2SER, GAMS, MSPIKE, SPEI  No results found for: KPAFRELGTCHN, LAMBDASER, KAPLAMBRATIO  Lab Results  Component Value Date   WBC 11.7 (H) 05/11/2017    NEUTROABS 8.7 (H) 05/11/2017   HGB 9.5 (L) 05/11/2017   HCT 28.2 (L) 05/11/2017   MCV 80.5 05/11/2017   PLT 281 05/11/2017      Chemistry      Component Value Date/Time   NA 141 05/11/2017 1042   K 3.9 05/11/2017 1042   CO2 28 05/11/2017 1042   BUN 9.8 05/11/2017 1042   CREATININE 0.8 05/11/2017 1042      Component Value Date/Time   CALCIUM 10.2 05/11/2017 1042   ALKPHOS 69 05/11/2017 1042   AST 14 05/11/2017 1042   ALT 15 05/11/2017 1042   BILITOT 0.47 05/11/2017 1042       No results found for: LABCA2  No components found for: OFBPZW258  No results for input(s): INR in the last 168 hours.  Urinalysis    Component Value Date/Time   COLORURINE yellow 01/22/2010 1457   APPEARANCEUR Clear 01/22/2010 1457   LABSPEC 1.015 01/22/2010 1457   PHURINE 5.0 01/22/2010 1457   GLUCOSEU NEGATIVE 05/24/2007 1404   HGBUR trace-lysed 01/22/2010 1457   BILIRUBINUR negative 01/22/2010 [place] 05/24/2007 1404   PROTEINUR NEGATIVE 05/24/2007 1404   UROBILINOGEN 0.2 01/22/2010 1457   NITRITE negative 01/22/2010 1457   LEUKOCYTESUR  05/24/2007 1404    NEGATIVE MICROSCOPIC NOT DONE ON URINES WITH NEGATIVE PROTEIN, BLOOD, LEUKOCYTES, NITRITE, OR GLUCOSE <1000 mg/dL.     STUDIES: Bilateral breast MRI with and without contrast scheduled for 05/23/2017.  ELIGIBLE FOR AVAILABLE RESEARCH PROTOCOL: No  ASSESSMENT: 46 y.o.  [place] woman status post right breast biopsy 02/01/2017 for a cT2 pN0, stage IB invasive ductal carcinoma, grade 2, estrogen and progesterone receptor positive, with an MIB-1 of 25%, and HER-2 amplified   (a) biopsy of a suspicious right axillary lymph node 01/24/2017 showed only reactive lymphoid tissue  (b) biopsy of right breast lower outer quadrant 02/17/2017 showed invasive ductal carcinoma, grade 3, estrogen and progesterone receptor positive, HER-2 not amplified, with an Mib-1 of 50%  (1) genetics testing 46-gene Common Hereditary  Cancer Panel offered by [facility] identified a single, heterozygous pathogenic gene mutation called CHEK2, c.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 There were no additional mutations in the remaining 45 genes analyzed: APC, ATM, AXIN2, BARD1, BMPR1A, BRCA1, BRCA2, BRIP1, CDH1, CDKN2A, CTNNA1, DICER1, EPCAM, GREM1, HOXB13, KIT, MEN1, MLH1, MSH2, MSH3, MSH6, MUTYH, NBN, NF1, NTHL1, PALB2, PDGFRA, PMS2, POLD1, POLE, PTEN,  RAD50, RAD51C, RAD51D, SDHA, SDHB, SDHC, SDHD, SMAD4, SMARCA4, STK11, TP53, TSC1, TSC2, and VHL.  (a) will need either bilateral mastectomies or intensified screening  (b) colon cancer screening to begin at age 42 and repeated every 5 years (10% lifetime risk).  (2) neoadjuvant chemotherapy will consist of carboplatin, docetaxel, and trastuzumab with pertuzumab given every 3 weeks 6, starting 02/16/2017   (3) anti-HER-2 immunotherapy to continue to total 1 year  (a) baseline echocardiogram 02/13/2017 found an ejection fraction of 55-60%  (4) definitive surgery to follow at the completion of chemotherapy  (5) adjuvant radiation as appropriate  (6) anti-estrogens to follow the completion of local treatment   PLAN: Olivia Frederick is Proceeding to her fifth of 6 planned cycles of chemotherapy/immunotherapy today. I am omitting the progenitor because of her diarrhea. We will consider reinstating it at her sixth and final cycle.  We discussed her CHEK2 mutation at length today. She understands this does increase to some extent her risk of developing another breast cancer in either breast in the future. That risk could be as high as 25%. That does mean that 3 out of 4 women like her would not be expected to develop breast cancer.  She had assumed that her only choice was to have bilateral mastectomies. However we can also do intensified screening, with yearly mammography followed by yearly MRI at six-month intervals. This is equally safe.  However for a younger patient to have to undergo imaging every 6  months for an indeterminate period of time, certainly about 10 years at a minimum (until her breast density decreases) seems onerous.  At this point she feels that if she is going to need a right mastectomy in any case she would certainly have bilateral mastectomies. Otherwise she is not quite sure which way to go. She would like to discuss this further with Dr. Excell Seltzer and to facilitate that we are setting her up for an MRI of the breast 05/23/2017.  It would be helpful if she could meet with a plastic surgeon before she meets with Dr. Excell Seltzer so she can understand what involving reconstruction. I have sent him a note regarding that  Otherwise she will return to see me in 3 weeks with her final cycle. If she has any peripheral neuropathy at that point we will omit the docetaxel and consider gemcitabine for the final dose  Chauncey Cruel, MD   05/29/2017 9:18 PM Medical Oncology and Hematology [facility] [place], [place] [address] Tel. [phone]    Fax. [phone]

## 2017-06-01 ENCOUNTER — Encounter: Payer: Self-pay | Admitting: *Deleted

## 2017-06-01 ENCOUNTER — Ambulatory Visit (HOSPITAL_BASED_OUTPATIENT_CLINIC_OR_DEPARTMENT_OTHER): Payer: 59

## 2017-06-01 ENCOUNTER — Ambulatory Visit (HOSPITAL_BASED_OUTPATIENT_CLINIC_OR_DEPARTMENT_OTHER): Payer: 59 | Admitting: Oncology

## 2017-06-01 ENCOUNTER — Other Ambulatory Visit (HOSPITAL_COMMUNITY)
Admission: AD | Admit: 2017-06-01 | Discharge: 2017-06-01 | Disposition: A | Discharge status: Home / Self Care | Payer: 59 | Source: Ambulatory Visit | Attending: Oncology | Admitting: Oncology

## 2017-06-01 ENCOUNTER — Other Ambulatory Visit (HOSPITAL_BASED_OUTPATIENT_CLINIC_OR_DEPARTMENT_OTHER): Payer: 59

## 2017-06-01 VITALS — BP 104/53 | HR 100 | Temp 98.1°F | Resp 18 | Ht 66.0 in | Wt 153.3 lb

## 2017-06-01 DIAGNOSIS — C50511 Malignant neoplasm of lower-outer quadrant of right female breast: Secondary | ICD-10-CM | POA: Diagnosis not present

## 2017-06-01 DIAGNOSIS — Z170 Estrogen receptor positive status [ER+]: Principal | ICD-10-CM

## 2017-06-01 DIAGNOSIS — C50411 Malignant neoplasm of upper-outer quadrant of right female breast: Secondary | ICD-10-CM | POA: Insufficient documentation

## 2017-06-01 DIAGNOSIS — Z5111 Encounter for antineoplastic chemotherapy: Secondary | ICD-10-CM | POA: Diagnosis not present

## 2017-06-01 DIAGNOSIS — Z5112 Encounter for antineoplastic immunotherapy: Secondary | ICD-10-CM | POA: Diagnosis not present

## 2017-06-01 LAB — CBC WITH DIFFERENTIAL/PLATELET
BASO%: 0.2 % (ref 0.0–2.0)
BASOS ABS: 0 10*3/uL (ref 0.0–0.1)
EOS%: 0 % (ref 0.0–7.0)
Eosinophils Absolute: 0 10*3/uL (ref 0.0–0.5)
HEMATOCRIT: 30 % — AB (ref 34.8–46.6)
HGB: 10.3 g/dL — ABNORMAL LOW (ref 11.6–15.9)
LYMPH#: 0.5 10*3/uL — AB (ref 0.9–3.3)
LYMPH%: 5.4 % — AB (ref 14.0–49.7)
MCH: 28.5 pg (ref 25.1–34.0)
MCHC: 34.5 g/dL (ref 31.5–36.0)
MCV: 82.6 fL (ref 79.5–101.0)
MONO#: 0.2 10*3/uL (ref 0.1–0.9)
MONO%: 1.8 % (ref 0.0–14.0)
NEUT#: 9.2 10*3/uL — ABNORMAL HIGH (ref 1.5–6.5)
NEUT%: 92.6 % — AB (ref 38.4–76.8)
PLATELETS: 301 10*3/uL (ref 145–400)
RBC: 3.63 10*6/uL — ABNORMAL LOW (ref 3.70–5.45)
RDW: 20.6 % — ABNORMAL HIGH (ref 11.2–14.5)
WBC: 9.9 10*3/uL (ref 3.9–10.3)

## 2017-06-01 LAB — COMPREHENSIVE METABOLIC PANEL
ALK PHOS: 74 U/L (ref 40–150)
ALT: 39 U/L (ref 0–55)
ANION GAP: 13 meq/L — AB (ref 3–11)
AST: 22 U/L (ref 5–34)
Albumin: 4 g/dL (ref 3.5–5.0)
BILIRUBIN TOTAL: 0.42 mg/dL (ref 0.20–1.20)
BUN: 8.7 mg/dL (ref 7.0–26.0)
CALCIUM: 10 mg/dL (ref 8.4–10.4)
CO2: 25 meq/L (ref 22–29)
CREATININE: 0.8 mg/dL (ref 0.6–1.1)
Chloride: 105 mEq/L (ref 98–109)
Glucose: 121 mg/dl (ref 70–140)
Potassium: 3.9 mEq/L (ref 3.5–5.1)
Sodium: 142 mEq/L (ref 136–145)
TOTAL PROTEIN: 7.4 g/dL (ref 6.4–8.3)

## 2017-06-01 LAB — PREGNANCY, URINE: PREG TEST UR: NEGATIVE

## 2017-06-01 MED ORDER — SODIUM CHLORIDE 0.9 % IV SOLN
Freq: Once | INTRAVENOUS | Status: AC
  Administered 2017-06-01: 14:00:00 via INTRAVENOUS

## 2017-06-01 MED ORDER — DIPHENHYDRAMINE HCL 25 MG PO CAPS
25.0000 mg | ORAL_CAPSULE | Freq: Once | ORAL | Status: AC
  Administered 2017-06-01: 25 mg via ORAL

## 2017-06-01 MED ORDER — SODIUM CHLORIDE 0.9% FLUSH
10.0000 mL | INTRAVENOUS | Status: DC | PRN
  Administered 2017-06-01: 10 mL
  Filled 2017-06-01: qty 10

## 2017-06-01 MED ORDER — PALONOSETRON HCL INJECTION 0.25 MG/5ML
INTRAVENOUS | Status: AC
  Filled 2017-06-01: qty 5

## 2017-06-01 MED ORDER — DIPHENHYDRAMINE HCL 25 MG PO CAPS
ORAL_CAPSULE | ORAL | Status: AC
  Filled 2017-06-01: qty 1

## 2017-06-01 MED ORDER — ACETAMINOPHEN 325 MG PO TABS
ORAL_TABLET | ORAL | Status: AC
  Filled 2017-06-01: qty 2

## 2017-06-01 MED ORDER — ACETAMINOPHEN 325 MG PO TABS
650.0000 mg | ORAL_TABLET | Freq: Once | ORAL | Status: AC
  Administered 2017-06-01: 650 mg via ORAL

## 2017-06-01 MED ORDER — SODIUM CHLORIDE 0.9 % IV SOLN
553.0000 mg | Freq: Once | INTRAVENOUS | Status: AC
  Administered 2017-06-01: 550 mg via INTRAVENOUS
  Filled 2017-06-01: qty 55

## 2017-06-01 MED ORDER — DEXAMETHASONE SODIUM PHOSPHATE 10 MG/ML IJ SOLN
10.0000 mg | Freq: Once | INTRAMUSCULAR | Status: AC
  Administered 2017-06-01: 10 mg via INTRAVENOUS

## 2017-06-01 MED ORDER — SODIUM CHLORIDE 0.9 % IV SOLN: Freq: Once | INTRAVENOUS | Status: DC

## 2017-06-01 MED ORDER — HEPARIN SOD (PORK) LOCK FLUSH 100 UNIT/ML IV SOLN
500.0000 [IU] | Freq: Once | INTRAVENOUS | Status: AC | PRN
  Administered 2017-06-01: 500 [IU]
  Filled 2017-06-01: qty 5

## 2017-06-01 MED ORDER — TRASTUZUMAB CHEMO 150 MG IV SOLR
6.0000 mg/kg | Freq: Once | INTRAVENOUS | Status: AC
  Administered 2017-06-01: 420 mg via INTRAVENOUS
  Filled 2017-06-01: qty 20

## 2017-06-01 MED ORDER — PALONOSETRON HCL INJECTION 0.25 MG/5ML
0.2500 mg | Freq: Once | INTRAVENOUS | Status: AC
  Administered 2017-06-01: 0.25 mg via INTRAVENOUS

## 2017-06-01 MED ORDER — DEXAMETHASONE SODIUM PHOSPHATE 10 MG/ML IJ SOLN
INTRAMUSCULAR | Status: AC
  Filled 2017-06-01: qty 1

## 2017-06-01 NOTE — Progress Notes (Signed)
[facility] Clinical Social Work  Clinical Social Work was referred by chaplain for assessment of psychosocial needs.  Chaplain had planned to see pt today, but could not due to family emergency. Clinical Social Worker met with patient at [facility] during her treatment to offer support and assess for needs.  Pt super excited as today is her last chemo! She appreciated visit, but had no concerns or needs currently. CSW encouraged her to reach out to Support Team as needs arise.   Clinical Social Work interventions:  Check in  Grier Hock, LCSW, OSW-C Clinical Social Worker [facility]  [facility] Phone: [phone] Fax: [phone]   

## 2017-06-01 NOTE — Patient Instructions (Addendum)
[facility] Discharge Instructions for Patients Receiving Chemotherapy  Today you received the following chemotherapy agents Carboplatin, Herceptin.   To help prevent nausea and vomiting after your treatment, we encourage you to take your nausea medication as prescribed.    If you develop nausea and vomiting that is not controlled by your nausea medication, call the clinic.   BELOW ARE SYMPTOMS THAT SHOULD BE REPORTED IMMEDIATELY:  *FEVER GREATER THAN 100.5 F  *CHILLS WITH OR WITHOUT FEVER  NAUSEA AND VOMITING THAT IS NOT CONTROLLED WITH YOUR NAUSEA MEDICATION  *UNUSUAL SHORTNESS OF BREATH  *UNUSUAL BRUISING OR BLEEDING  TENDERNESS IN MOUTH AND THROAT WITH OR WITHOUT PRESENCE OF ULCERS  *URINARY PROBLEMS  *BOWEL PROBLEMS  UNUSUAL RASH Items with * indicate a potential emergency and should be followed up as soon as possible.  Feel free to call the clinic you have any questions or concerns. The clinic phone number is [phone].  Please show the [facility] at check-in to the Emergency Department and triage nurse.

## 2017-06-01 NOTE — Progress Notes (Signed)
[place]  [phone]) [phone] Fax:(336) [phone]     ID: Olivia Frederick DOB: 1970/11/20  MR#: [id number]  DDU#:[id number]  Patient Care Team: Olivia Frederick, Olivia Apa, DO as PCP - General (Family Medicine) Olivia Seltzer, MD as Consulting Physician (General Surgery) Magrinat, Virgie Dad, MD as Consulting Physician (Oncology) Olivia Pray, MD as Consulting Physician (Radiation Oncology) Olivia Cruel, MD OTHER MD:  CHIEF COMPLAINT: Triple positive breast cancer  CURRENT TREATMENT: Neoadjuvant chemotherapy   BREAST CANCER HISTORY: From the original intake note:  Olivia Frederick noted a change in her right breast around November 2017. She was not very concerned about it but did mention it at the time of mammography which had been scheduled for 01/30/2017. Accordingly she had bilateral diagnostic mammography with tomography and right breast ultrasonography that day at the [facility]. In the right breast there was an irregular mass measuring approximately 5 cm. On exam this was firm and palpable at the 12:00 location 2 cm from the nipple targeted ultrasonography showed an area of mixed echogenicity superiorly measuring at least 4.6 cm with a similar appearing adjacent area measuring 1.3 cm thought to be contiguous ultrasound of the right axilla showed one ymph node with cortical thickening.  Biopsy of the right breast mass 01/31/2017 showed ([id number]) invasive ductal carcinoma, grade 2, estrogen receptor 100% positive, with strong staining intensity, progesterone receptor 25% positive, with moderate staining intensity, with an MIB-1 of 25%, and HER-2 amplified with a signals ratio of 2.35, the number per cell being 4.0. Biopsy of the suspicious axillary lymph node showed only reactive  Her subsequent history is as detailed below  INTERVAL HISTORY: Olivia Frederick returns today for follow-up and treatment of her triple positive breast cancer. Today is day 1 cycle 6 of 6  planned cycles of carboplatin, docetaxel, trastuzumab, and Pertuzumab.  Breast MRI obtained 05/23/2017 shows significant response but there remains multicentric disease. It will be difficult to avoid mastectomy.  REVIEW OF SYSTEMS: Olivia Frederick complains of increasing numbness and tingling in her finger pads and to some extent her toe pads. She is about to lose her left large toenail. She is very concerned about reconstruction possibilities and tells me she feels very confused about how to proceed. Aside from that she remains entirely functional, working full-time, and planning on a vacation next week. A detailed review of systems today was otherwise stable.  PAST MEDICAL HISTORY: Past Medical History:  Diagnosis Date  . Cervical dysplasia   . Genetic testing 04/24/2017   Olivia Frederick underwent genetic counseling and testing for hereditary cancer syndromes on 03/08/2017. 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   The remaining 45 genes analyzed by [facility]'s 46-gene Common Hereditary Cancers Panel were negative for mutations. Genes analyzed include: APC, ATM, AXIN2, BARD1, BMPR1A, BRCA1, BRCA2, BRIP1, CDH1, CDKN2    PAST SURGICAL HISTORY: Past Surgical History:  Procedure Laterality Date  . BREAST EXCISIONAL BIOPSY Right   . BREAST SURGERY     right breast benign tumor     FAMILY HISTORY Family History  Problem Relation Age of Onset  . Hypertension Mother   . Diabetes Father   . Leukemia Paternal Grandfather        d.64  . Breast cancer Cousin 40       Bilateral breast cancer. Daughter of paternal uncle with leukemia.  . Leukemia Paternal Uncle 14  . Lung cancer Maternal Uncle 74  . Brain cancer Maternal Aunt 69  d.66  . Cancer Maternal Uncle        unspecified type  . Brain cancer Cousin 77       d.55 daughter of maternal aunt with brain cancer  The patient's parents are living, both in their early 37s as of April 2018. She  has one brother and one sister. On the paternal side a grandfather was diagnosed with leukemia at age 33, an uncle also with leukemia at age 36, another uncle at age 107 with colon cancer, and a cousin with breast cancer at age 64. On the mother's side there was an uncle with cancer of some type, not known to the patient, diagnosed age 49   GYNECOLOGIC HISTORY:  No LMP recorded.  Menarche age 89. She is GX P2, first live birth age 35. As of April 2018 she is having regular periods. She never used oral contraceptives.    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 Son Olivia Frederick is 19, daughter cavity is 72, as of April 2018.     ADVANCED DIRECTIVES: Not in place    HEALTH MAINTENANCE: Social History  Substance Use Topics  . Smoking status: Former Smoker    Quit date: 02/01/2017  . Smokeless tobacco: Never Used  . Alcohol use Yes     Colonoscopy: n/a  PAP:  Bone density:   No Known Allergies  Current Outpatient Prescriptions  Medication Sig Dispense Refill  . acetaminophen (TYLENOL) 325 MG tablet Take 650 mg by mouth as needed.    Marland Kitchen dexamethasone (DECADRON) 4 MG tablet Take 2 tablets (8 mg total) by mouth 2 (two) times daily. Start the day before Taxotere. Then again the day after chemo for 3 days. 30 tablet 1  . diphenhydrAMINE (BENADRYL) 25 mg capsule Take 25 mg by mouth as needed.    . lidocaine-prilocaine (EMLA) cream Apply to affected area once 30 g 3  . LORazepam (ATIVAN) 0.5 MG tablet Take 1 tablet (0.5 mg total) by mouth every 6 (six) hours as needed (Nausea or vomiting). 30 tablet 0  . prochlorperazine (COMPAZINE) 10 MG tablet Take 1 tablet (10 mg total) by mouth every 6 (six) hours as needed (Nausea or vomiting). 30 tablet 1   No current facility-administered medications for this visit.    Facility-Administered Medications Ordered in Other Visits  Medication Dose Route Frequency Provider Last Rate Last Dose  . heparin lock flush 100 unit/mL   500 Units Intracatheter Once PRN Magrinat, Virgie Dad, MD      . sodium chloride flush (NS) 0.9 % injection 10 mL  10 mL Intracatheter PRN Magrinat, Virgie Dad, MD        OBJECTIVEShea Stakes African-American woman in no acute distress Vitals:   06/01/17 1212  BP: (!) 104/53  Pulse: 100  Resp: 18  Temp: 98.1 F (36.7 C)     Body mass index is 24.74 kg/m.    Filed Weights   06/01/17 1212  Weight: 153 lb 4.8 oz (69.5 kg)     ECOG FS:1 - Symptomatic but completely ambulatory   Sclerae unicteric, EOMs intact Oropharynx clear and moist No cervical or supraclavicular adenopathy Lungs no rales or rhonchi Heart regular rate and rhythm Abd soft, nontender, positive bowel sounds MSK no focal spinal tenderness, no upper extremity lymphedema Neuro: nonfocal, well oriented, appropriate affect Breasts: The changes in the periareolar area of the right breast, hard to detect. The left breast is benign. Both axillae are benign.  LAB RESULTS:  CMP     Component Value Date/Time   NA 142 06/01/2017 1149   K 3.9 06/01/2017 1149   CO2 25 06/01/2017 1149   GLUCOSE 121 06/01/2017 1149   BUN 8.7 06/01/2017 1149   CREATININE 0.8 06/01/2017 1149   CALCIUM 10.0 06/01/2017 1149   PROT 7.4 06/01/2017 1149   ALBUMIN 4.0 06/01/2017 1149   AST 22 06/01/2017 1149   ALT 39 06/01/2017 1149   ALKPHOS 74 06/01/2017 1149   BILITOT 0.42 06/01/2017 1149    No results found for: TOTALPROTELP, ALBUMINELP, A1GS, A2GS, BETS, BETA2SER, GAMS, MSPIKE, SPEI  No results found for: Nils Pyle, [facility]  Lab Results  Component Value Date   WBC 9.9 06/01/2017   NEUTROABS 9.2 (H) 06/01/2017   HGB 10.3 (L) 06/01/2017   HCT 30.0 (L) 06/01/2017   MCV 82.6 06/01/2017   PLT 301 06/01/2017      Chemistry      Component Value Date/Time   NA 142 06/01/2017 1149   K 3.9 06/01/2017 1149   CO2 25 06/01/2017 1149   BUN 8.7 06/01/2017 1149   CREATININE 0.8 06/01/2017 1149      Component  Value Date/Time   CALCIUM 10.0 06/01/2017 1149   ALKPHOS 74 06/01/2017 1149   AST 22 06/01/2017 1149   ALT 39 06/01/2017 1149   BILITOT 0.42 06/01/2017 1149       No results found for: LABCA2  No components found for: [id number]  No results for input(s): INR in the last 168 hours.  Urinalysis    Component Value Date/Time   COLORURINE yellow 01/22/2010 1457   APPEARANCEUR Clear 01/22/2010 1457   LABSPEC 1.015 01/22/2010 1457   PHURINE 5.0 01/22/2010 1457   GLUCOSEU NEGATIVE 05/24/2007 1404   HGBUR trace-lysed 01/22/2010 1457   BILIRUBINUR negative 01/22/2010 [place] 05/24/2007 1404   PROTEINUR NEGATIVE 05/24/2007 1404   UROBILINOGEN 0.2 01/22/2010 1457   NITRITE negative 01/22/2010 1457   LEUKOCYTESUR  05/24/2007 1404    NEGATIVE MICROSCOPIC NOT DONE ON URINES WITH NEGATIVE PROTEIN, BLOOD, LEUKOCYTES, NITRITE, OR GLUCOSE <1000 mg/dL.     STUDIES: Mr Breast Bilateral W Wo Contrast  Result Date: 05/23/2017 CLINICAL DATA:  Right breast cancer, response to chemotherapy. LABS:  Not applicable EXAM: BILATERAL BREAST MRI WITH AND WITHOUT CONTRAST TECHNIQUE: Multiplanar, multisequence MR images of both breasts were obtained prior to and following the intravenous administration of 13 ml of MultiHance. THREE-DIMENSIONAL MR IMAGE RENDERING ON INDEPENDENT WORKSTATION: Three-dimensional MR images were rendered by post-processing of the original MR data on an independent workstation. The three-dimensional MR images were interpreted, and findings are reported in the following complete MRI report for this study. Three dimensional images were evaluated at the independent DynaCad workstation COMPARISON:  Breast MRI dated 02/13/2017. Ultrasound-guided biopsy dated 02/17/2017. FINDINGS: Breast composition: c. Heterogeneous fibroglandular tissue. Background parenchymal enhancement: Moderate. Right breast: Markedly decreased intensity of the contrast enhancement at the sites of the 3  adjacent irregular masses within the upper and central right breast. Minimal enhancement persists on delayed postcontrast imaging as follows: 1. Upper retroareolar right breast mass, previously 1.6 x 1.5 x 1.5 cm, currently measuring 1 x 0.7 cm, markedly decreased intensity of the contrast enhancement (now sub threshold enhancement kinetics) indicating good response to interval therapy. 2. Dominant mass in the upper right breast, anterior third with biopsy clip artifact, previously measuring 3.1 x 1.6 x 1.3 cm, currently measuring 2.5 x 1.1 cm, also markedly decreased intensity of the contrast enhancement (now  sub threshold enhancement kinetics) indicating good response to interval therapy. 3. Mass in the inner central right breast, previously measuring 1.8 x 1.8 x 1.6 cm, currently measuring 1.1 x 0.8 cm, also markedly decreased intensity of the contrast enhancement (now sub threshold enhancement kinetics) indicating good response to interval therapy. Additional biopsied mass within the lower outer right breast, near the junction of the middle and posterior thirds, with biopsy clip artifact, previously measuring 1.2 x 0.9 x 1.2 cm, currently only punctate enhancement, also markedly decreased intensity of the contrast enhancement (now sub threshold enhancement kinetics) indicating good response to interval therapy. Left breast: No mass or abnormal enhancement. Lymph nodes: No enlarged or morphologically abnormal lymph nodes identified within the bilateral axillary or internal mammary chain regions. Ancillary findings:  None. IMPRESSION: 1. The 4 previously demonstrated enhancing masses within the right breast all demonstrate markedly decreased intensity of the contrast enhancement indicating good response to interval therapy. Only minimal contrast enhancement persists at each site, now only seen on delayed images with sub threshold enhancement kinetics, with measurements given above. 2. No evidence of malignancy  within the left breast. 3. No evidence of metastatic lymphadenopathy. RECOMMENDATION: Per current treatment plan for patient's biopsy-proven right breast cancer. BI-RADS CATEGORY  6: Known biopsy-proven malignancy. Electronically Signed   By: Franki Cabot M.D.   On: 05/23/2017 12:52     ELIGIBLE FOR AVAILABLE RESEARCH PROTOCOL: No  ASSESSMENT: 46 y.o. CHEK2 positive Olivia Frederick woman status post right breast biopsy 02/01/2017 for a cT2 pN0, stage IB invasive ductal carcinoma, grade 2, estrogen and progesterone receptor positive, with an MIB-1 of 25%, and HER-2 amplified   (a) biopsy of a suspicious right axillary lymph node 01/24/2017 showed only reactive lymphoid tissue  (b) biopsy of right breast lower outer quadrant 02/17/2017 showed invasive ductal carcinoma, grade 3, estrogen and progesterone receptor positive, HER-2 not amplified, with an Mib-1 of 50%  (1) genetics testing 46-gene Common Hereditary Cancer Panel offered by [facility] identified a single, heterozygous pathogenic gene mutation called CHEK2, c.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 There were no additional mutations in the remaining 45 genes analyzed: APC, ATM, AXIN2, BARD1, BMPR1A, BRCA1, BRCA2, BRIP1, CDH1, CDKN2A, CTNNA1, DICER1, EPCAM, GREM1, HOXB13, KIT, MEN1, MLH1, MSH2, MSH3, MSH6, MUTYH, NBN, NF1, NTHL1, PALB2, PDGFRA, PMS2, POLD1, POLE, PTEN, RAD50, RAD51C, RAD51D, SDHA, SDHB, SDHC, SDHD, SMAD4, SMARCA4, STK11, TP53, TSC1, TSC2, and VHL.  (a) will need either bilateral mastectomies or intensified screening  (b) colon cancer screening to begin at age 49 and repeated every 5 years (10% lifetime risk).  (2) neoadjuvant chemotherapy will consist of carboplatin, docetaxel, and trastuzumab with pertuzumab given every 3 weeks 6, starting 02/16/2017, completed 06/01/2017  (3) anti-HER-2 immunotherapy to continue to total 1 year  (a) baseline echocardiogram 02/13/2017 found an ejection fraction of 55-60%  (b) echocardiogram 05/23/2017 found  an ejection fraction of 55-60%.  (4) definitive surgery to follow at the completion of chemotherapy  (5) adjuvant radiation as appropriate  (6) anti-estrogens to follow the completion of local treatment   PLAN: Emmah is completing her neoadjuvant chemotherapy today. I am omitting the Taxotere because she is having peripheral neuropathy symptoms as well as of course some nail discoloration. We are proceeding with the carboplatin and the trastuzumab and Pertuzumab as before.  She is now ready to proceed to surgery and is in a hurry to do so. The MRI shows response but there is still quite a bit of tumor in the right breast noted I don't think she is can be  able to do multiple lumpectomies but that really of course would be up to the surgeon.  In any case because of her genetic mutation she would prefer and I concur to have bilateral mastectomies. The real question is reconstruction. She apparently is not a candidate for DIEP because she does not have enough fat. She does not want to have an implant she says because she does not want to have something for an in her body although certainly expanders and implants would be the most straightforward and if she can have nipple sparing mastectomies the cosmetic results can be excellent. Flapsare more complicated and she is not sure she really wants to proceed with those. In short she it is difficult for her to make a decision regarding which type of reconstruction to have an at the same time she isn't her to have her surgery.  The final concern is that she probably will need radiation. I am copying Dr. Sondra Come for his input.  Otherwise she completes chemotherapy today. She will return in 3 weeks for trastuzumab alone. She will see me again in 6 weeks but she knows to call for any problems that may develop before that visit.  Olivia Cruel, MD   06/03/2017 9:18 AM Medical Oncology and Hematology [facility] [place], [place] [address] Tel. [phone]    Fax. [phone]

## 2017-06-02 ENCOUNTER — Ambulatory Visit: Payer: Self-pay | Admitting: General Surgery

## 2017-06-02 DIAGNOSIS — C50811 Malignant neoplasm of overlapping sites of right female breast: Secondary | ICD-10-CM

## 2017-06-03 ENCOUNTER — Ambulatory Visit: Payer: 59

## 2017-06-06 ENCOUNTER — Telehealth: Payer: Self-pay | Admitting: Oncology

## 2017-06-06 NOTE — Telephone Encounter (Signed)
sw pt to confirm time change for 8/16 appts per sch msg

## 2017-06-15 ENCOUNTER — Encounter: Payer: Self-pay | Admitting: *Deleted

## 2017-06-19 ENCOUNTER — Encounter (HOSPITAL_BASED_OUTPATIENT_CLINIC_OR_DEPARTMENT_OTHER): Payer: Self-pay | Admitting: *Deleted

## 2017-06-19 NOTE — H&P (Signed)
Subjective:     Patient ID: Olivia Frederick is a 46 y.o. female.  HPI  Here for follow up discussion breast reconstruction. Presented following screening MMG with right breaat mass measuring 5 cm, palpable on exam. US demonstrated 4.6 cm mass at 12:00 location 2 cmfn with a similar appearing adjacent area measuring 1.3 cm thought to be contiguous. [place] axilla showed one LN with cortical thickening.  Biopsy of the right breast mass showed Er/PR+, HER-2 +. Biopsy of the axillary lymph node showed only reactive tissue. MRI showed three adjacent irregular masses within the upper/ central right breast measuring 5.1 x 1.8 x 3.1 cm. An additional 1.2 cm mass within the lower outer right breast noted. Second look [place] of latter completed and biopsy with IDC with DCIS, ER/PR+, Her2-. She completes neoadjuvant chemotherapy todayk.   Final MRI notes right retroareolar mass, previously 1.6 x 1.5 x 1.5 cm, currently measuring 1 x 0.7 cm. Dominant mass in the upper right breast previously measuring 3.1 x 1.6 x 1.3 cm, currently measuring 2.5 x 1.1 cm.  Mass in the inner central right breast, previously measuring 1.8 x 1.8 x 1.6 cm, currently measuring 1.1 x 0.8 cm. Additional biopsied mass within the lower outer right breast, previously measuring 1.2 x 0.9 x 1.2 cm, currently only punctate enhancement. No abnormal LN bilateral.   Genetics with heterozygous CHEK 2 mutation. Plan for bilateral mastectomies. Cousin in [place] that had breast cancer, per discussion sounds like she had autologous reconstruction.  Current 36 C happy with this.   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 Lives with husband and daughter.  Review of Systems     Objective:   Physical Exam  Constitutional: She is oriented to person, place, and time.  Cardiovascular: Normal rate, regular rhythm and normal heart sounds.   Pulmonary/Chest: Effort normal and breath sounds normal.  Abdominal: Soft.  Striae with laxity of skin with no  redundancy tissue  Lymphadenopathy:    She has no axillary adenopathy.  Neurological: She is alert and oriented to person, place, and time.   Grade 2 ptosis   SN to nipple R 25 L 25 cm BW R 15 L 16 cm (CW 14 cm) Nipple to IMF R 11 L 10 cm  Assessment:     Right breast ca UOQ ER+ Chek2 mutation Neoadjuvant chemotherapy    Plan:     Since last visit has met with Drs. Magrinat and [place]. MRI reviewed with Dr. Excell Seltzer and plan bilateral NSM. She has expressed concern about having implants to her oncologist. She desires to proceed with bilateral immediate TE placement.   Reviewed IMF incisions, drains, OR length, hospital stay and recovery/post operative limitations for  for each. Discussed process of expansion and implant based risks including rupture, MRI surveillance for silicone implants, infection requiring surgery or removal, contracture.   I have previously counseled that I do not feel she has sufficient soft tissue abdomen to complete bilateral breast reconstruction and maintain current volume. If she proceeds with immediate TE placement, this would preserve soft tissue envelope and she could consult with microsurgeon following mastectomies to see if candidate for other autologous options.   Discussed future surgery dependent on adjuvant treatments. Will need to await final pathology but reviewed that XRT will increase risks reconstruction. Again placement of expanders would preserve soft tissue envelope but could elect for autologous after XRT if this is indicated.  Reviewed SSM vs NSM, both will be asensate and not stimulate. Reviewed with both risks mastectomy  flap necrosis requiring additional surgery.   Reviewed use of ADM in reconstruction, cadaveric source. Reviewed this will incorporate over time however if problems with seroma, infection early on can act as another nidus for infection and may require removal.  Additional risks including but not limited to  bleeding, hematoma, seroma, damage to deeper structures, extrusion, DVT/PE, cardiopulmonary complications, wound healing problems reviewed.  Rx for Norco, Robaxin, and Bactrim given.  Irene Limbo, MD [facility] Plastic [facility] Reconstruct[facility] Surgery [phone], pin [phone]

## 2017-06-22 ENCOUNTER — Other Ambulatory Visit (HOSPITAL_BASED_OUTPATIENT_CLINIC_OR_DEPARTMENT_OTHER): Payer: 59

## 2017-06-22 ENCOUNTER — Other Ambulatory Visit (HOSPITAL_COMMUNITY)
Admission: RE | Admit: 2017-06-22 | Discharge: 2017-06-22 | Disposition: A | Discharge status: Home / Self Care | Payer: 59 | Source: Ambulatory Visit | Attending: Oncology | Admitting: Oncology

## 2017-06-22 ENCOUNTER — Ambulatory Visit (HOSPITAL_BASED_OUTPATIENT_CLINIC_OR_DEPARTMENT_OTHER): Payer: 59

## 2017-06-22 VITALS — BP 129/71 | HR 72 | Temp 98.2°F | Resp 17

## 2017-06-22 DIAGNOSIS — Z170 Estrogen receptor positive status [ER+]: Principal | ICD-10-CM

## 2017-06-22 DIAGNOSIS — C50411 Malignant neoplasm of upper-outer quadrant of right female breast: Secondary | ICD-10-CM | POA: Insufficient documentation

## 2017-06-22 DIAGNOSIS — Z5112 Encounter for antineoplastic immunotherapy: Secondary | ICD-10-CM | POA: Diagnosis not present

## 2017-06-22 DIAGNOSIS — C50511 Malignant neoplasm of lower-outer quadrant of right female breast: Secondary | ICD-10-CM

## 2017-06-22 LAB — COMPREHENSIVE METABOLIC PANEL
ALK PHOS: 76 U/L (ref 40–150)
ALT: 14 U/L (ref 0–55)
ANION GAP: 10 meq/L (ref 3–11)
AST: 14 U/L (ref 5–34)
Albumin: 3.9 g/dL (ref 3.5–5.0)
BUN: 13.3 mg/dL (ref 7.0–26.0)
CALCIUM: 9.8 mg/dL (ref 8.4–10.4)
CHLORIDE: 104 meq/L (ref 98–109)
CO2: 26 mEq/L (ref 22–29)
Creatinine: 0.8 mg/dL (ref 0.6–1.1)
EGFR: >90 mL/min/{1.73_m2} (ref >90)
Glucose: 109 mg/dl (ref 70–140)
POTASSIUM: 4.1 meq/L (ref 3.5–5.1)
Sodium: 140 mEq/L (ref 136–145)
Total Bilirubin: 0.59 mg/dL (ref 0.20–1.20)
Total Protein: 7.4 g/dL (ref 6.4–8.3)

## 2017-06-22 LAB — CBC WITH DIFFERENTIAL/PLATELET
BASO%: 0.8 % (ref 0.0–2.0)
Basophils Absolute: 0 10*3/uL (ref 0.0–0.1)
EOS%: 2.6 % (ref 0.0–7.0)
Eosinophils Absolute: 0.1 10*3/uL (ref 0.0–0.5)
HEMATOCRIT: 30.6 % — AB (ref 34.8–46.6)
HGB: 10.6 g/dL — ABNORMAL LOW (ref 11.6–15.9)
LYMPH%: 33.4 % (ref 14.0–49.7)
MCH: 28.8 pg (ref 25.1–34.0)
MCHC: 34.7 g/dL (ref 31.5–36.0)
MCV: 83.2 fL (ref 79.5–101.0)
MONO#: 0.6 10*3/uL (ref 0.1–0.9)
MONO%: 12.4 % (ref 0.0–14.0)
NEUT#: 2.3 10*3/uL (ref 1.5–6.5)
NEUT%: 50.8 % (ref 38.4–76.8)
PLATELETS: 179 10*3/uL (ref 145–400)
RBC: 3.68 10*6/uL — ABNORMAL LOW (ref 3.70–5.45)
RDW: 18.9 % — ABNORMAL HIGH (ref 11.2–14.5)
WBC: 4.6 10*3/uL (ref 3.9–10.3)
lymph#: 1.5 10*3/uL (ref 0.9–3.3)

## 2017-06-22 LAB — PREGNANCY, URINE: PREG TEST UR: NEGATIVE

## 2017-06-22 MED ORDER — ACETAMINOPHEN 325 MG PO TABS
ORAL_TABLET | ORAL | Status: AC
  Filled 2017-06-22: qty 2

## 2017-06-22 MED ORDER — SODIUM CHLORIDE 0.9 % IV SOLN
Freq: Once | INTRAVENOUS | Status: AC
  Administered 2017-06-22: 16:00:00 via INTRAVENOUS

## 2017-06-22 MED ORDER — SODIUM CHLORIDE 0.9 % IV SOLN
6.0000 mg/kg | Freq: Once | INTRAVENOUS | Status: AC
  Administered 2017-06-22: 420 mg via INTRAVENOUS
  Filled 2017-06-22: qty 20

## 2017-06-22 MED ORDER — SODIUM CHLORIDE 0.9% FLUSH
10.0000 mL | INTRAVENOUS | Status: DC | PRN
  Administered 2017-06-22: 10 mL
  Filled 2017-06-22: qty 10

## 2017-06-22 MED ORDER — HEPARIN SOD (PORK) LOCK FLUSH 100 UNIT/ML IV SOLN
500.0000 [IU] | Freq: Once | INTRAVENOUS | Status: AC | PRN
  Administered 2017-06-22: 500 [IU]
  Filled 2017-06-22: qty 5

## 2017-06-22 MED ORDER — DIPHENHYDRAMINE HCL 25 MG PO CAPS
ORAL_CAPSULE | ORAL | Status: AC
  Filled 2017-06-22: qty 1

## 2017-06-22 MED ORDER — ACETAMINOPHEN 325 MG PO TABS
650.0000 mg | ORAL_TABLET | Freq: Once | ORAL | Status: AC
  Administered 2017-06-22: 650 mg via ORAL

## 2017-06-22 MED ORDER — DIPHENHYDRAMINE HCL 25 MG PO CAPS
25.0000 mg | ORAL_CAPSULE | Freq: Once | ORAL | Status: AC
  Administered 2017-06-22: 25 mg via ORAL

## 2017-06-22 NOTE — Patient Instructions (Signed)
[facility] Discharge Instructions for Patients Receiving Chemotherapy  Today you received the following chemotherapy agents:  Herceptin  To help prevent nausea and vomiting after your treatment, we encourage you to take your nausea medication as prescribed.   If you develop nausea and vomiting that is not controlled by your nausea medication, call the clinic.   BELOW ARE SYMPTOMS THAT SHOULD BE REPORTED IMMEDIATELY:  *FEVER GREATER THAN 100.5 F  *CHILLS WITH OR WITHOUT FEVER  NAUSEA AND VOMITING THAT IS NOT CONTROLLED WITH YOUR NAUSEA MEDICATION  *UNUSUAL SHORTNESS OF BREATH  *UNUSUAL BRUISING OR BLEEDING  TENDERNESS IN MOUTH AND THROAT WITH OR WITHOUT PRESENCE OF ULCERS  *URINARY PROBLEMS  *BOWEL PROBLEMS  UNUSUAL RASH Items with * indicate a potential emergency and should be followed up as soon as possible.  Feel free to call the clinic you have any questions or concerns. The clinic phone number is [phone].  Please show the CHEMO ALERT CARD at check-in to the Emergency Department and triage nurse.   

## 2017-06-26 NOTE — H&P (Signed)
History of Present Illness Marland Kitchen T. Braley Luckenbaugh MD; 06/02/2017 12:11 PM) Patient words: f/u breast ca.  The patient is a 46 year old female who presents with breast cancer. She returns for preoperative evaluation following neoadjuvant chemotherapy for multicentric cancer of the right breast. Her original presentation was as follows:  She is a premenopausal female referred by Dr. Curlene Dolphin for evaluation of recently diagnosed carcinoma of the right breast. she has undergone routine yearly mammography but does have very dense breast tissue. For the last several months she has been able to feel a thickening or mass near the right nipple. She recently presented to her GYN office for evaluation and was referred to the breast center for evaluation. Subsequent imaging included diagnostic mamogram showing an approximately 5 cm irregular mass in the anterior superior right breast at the area of the palpable abnormality measuring about 5 cm and ultrasound showing an irregular mass at the 12:00 position nipple measuring at least 4 x 6 x 3.4 x 2.7 cm. At the 1:00 position 2 cm from the nipple is a 1.3 cm similar area felt to be contiguous. Axillary ultrasound revealed a 4 mm lymph node withscortical thickening felt to be somewhat suspicious. An ultrasound guided breast biopsy was performed on 01/31/2017 with pathology revealing invasive ductal carcinoma of the breast. lymph node biopsy was negative with lymphoid hyperplasia. She is seen now in breast multidisciplinary clinic for initial treatment planning. She has experienced a palpable mass as noted but no skin changes, pain or nipple discharge. She has a history of a benign fibroadenoma removed from the right breast at age 47.  Findings at that time were the following: Tumor size: 5 cm Tumor grade: 2, Ki-67 25% Estrogen Receptor: +100% Progesterone Receptor: +25% Her-2 neu: positive Lymph node status: negative  Subsequent pretreatment MRI was  significant for a 1.6 cm upper retroareolar right breast mass, a 3.1 cm mass in the upper right breast anterior third and a 1.8 cm mass in the inner central right breast. These all encompassed 5.1 cm. A slightly irregular 1.2 cm mass was also seen in the lower outer right breast. Subsequent MR guided biopsy revealed invasive ductal carcinoma. She underwent genetic testing and was positive for the chek 2 gene. She desires bilateral mastectomy.  She tolerated chemotherapy well. Subsequent posttreatment MRI showed markedly decreased intensity in enhancement in the 3 irregular masses in the upper breast which have shrunk from 1.6 cm to 1 cm, 3.1 cm to 2.5 cm and from 1.8 cm to 1.1 cm. The lower outer mass shows only punctate enhancement. Lymph nodes are negative.  She has seen plastic surgery and discussed reconstruction options.   Problem List/Past Medical Marland Kitchen T. Emmily Pellegrin, MD; 06/02/2017 12:11 PM) BREAST CANCER, STAGE 2, RIGHT (C50.911)   Past Surgical History Marland Kitchen T. Basilio Meadow, MD; 06/02/2017 12:11 PM) Cesarean Section - 1   Diagnostic Studies History Marland Kitchen T. Jasiya Markie, MD; 06/02/2017 12:11 PM) Colonoscopy  never Mammogram  within last year Pap Smear  1-5 years ago  Allergies Malachi Bonds, CMA; 06/02/2017 11:35 AM) No Known Drug Allergies 06/02/2017  Medication History (Chemira Jones, CMA; 06/02/2017 11:36 AM) No Current Medications Medications Reconciled  Social History Marland Kitchen T. Sairah Knobloch, MD; 06/02/2017 12:11 PM) Alcohol use  Occasional alcohol use. Caffeine use  Carbonated beverages, Coffee. Tobacco use  Former smoker.  Family History Marland Kitchen T. Amayrani Bennick, MD; 06/02/2017 12:11 PM) Breast Cancer  Family Members In General. Diabetes Mellitus  Father. Hypertension  Mother.  Pregnancy / Birth History Marland Kitchen T.  Clevland Cork, MD; 06/02/2017 12:11 PM) Age at menarche  26 years. Gravida  2 Length (months) of breastfeeding  3-6 Maternal age   17-25 Para  2 Regular periods   Other Problems Marland Kitchen T. Braylyn Eye, MD; 06/02/2017 12:11 PM) Breast Cancer  Lump In Breast   Vitals (Chemira Jones CMA; 06/02/2017 11:35 AM) 06/02/2017 11:33 AM Weight: 152 lb Height: 66in Body Surface Area: 1.78 m Body Mass Index: 24.53 kg/m  Temp.: 98.84F(Oral)  Pulse: 88 (Regular)  BP: 120/80 (Sitting, Left Arm, Standard)       Physical Exam Marland Kitchen T. Daylani Deblois MD; 06/02/2017 12:12 PM) The physical exam findings are as follows: Note:General: Alert, well-developed and well nourished African-American female, in no distress Skin: Warm and dry without rash or infection. HEENT: No palpable masses or thyromegaly. Sclera nonicteric. Pupils equal round and reactive. Lymph nodes: No cervical, supraclavicular, nodes palpable. Breasts: There is subtle firmness palpable in the upper outer retroareolar right breast. No skin fixation or evidence of skin involvement. Moderately ptotic breasts with clavicle to inframammary crease distance 30 cm. Left breast negative. Lungs: Breath sounds clear and equal. No wheezing or increased work of breathing. Cardiovascular: Regular rate and rhythm without murmer. Extremities: No edema or joint swelling or deformity. No chronic venous stasis changes. Neurologic: Alert and fully oriented. Gait normal. No focal weakness. Psychiatric: Normal mood and affect. Thought content appropriate with normal judgement and insight    Assessment & Plan Marland Kitchen T. Aracely Rickett MD; 06/02/2017 12:16 PM) BREAST CANCER, STAGE 2, RIGHT (C50.911) Impression: 46 year old female with a new diagnosis of cancer of the right breast, multicentric. Clinical stage 28, ER positive, PR positive, HER-2 positive. Genetically positive for chek 2. She desires bilateral mastectomy which I feel is very reasonable. She has completed neoadjuvant chemotherapy with significant reduction in enhancement and size of her breast masses. She desires  immediate reconstruction. She has seen plastic surgery and discussed options. I discussed mastectomy options including skin sparing versus nipple sparing. We discussed pros and cons of each approach. After discussion she would prefer nipple sparing mastectomy and I think although not an ideal candidate because of the location of her tumor would be a reasonable candidate. She has had good response from chemotherapy. We discussed that there is a slight chance of a positive skin margins in the retroareolar area that would require removal of nipple skin postoperatively. We discussed the nature of the procedure including axillary sentinel lymph node biopsy. We discussed some increased risk of ischemia and loss of the nipple with nipple sparing mastectomy being about 10%. Discussed need for subsequent removal of the nipple and/or areola based on positive margins or positive biopsy. Risk of bleeding, infection slight risk of lymphedema discussed and understood. She would like to proceed as soon as possible and we will begin to work on scheduling. Current Plans Bilateral nipple sparing total mastectomy with right axillary sentinel lymph node biopsy to be followed by immediate reconstruction.

## 2017-06-27 ENCOUNTER — Encounter (HOSPITAL_BASED_OUTPATIENT_CLINIC_OR_DEPARTMENT_OTHER): Payer: Self-pay | Admitting: *Deleted

## 2017-06-27 ENCOUNTER — Ambulatory Visit (HOSPITAL_BASED_OUTPATIENT_CLINIC_OR_DEPARTMENT_OTHER)
Admission: RE | Admit: 2017-06-27 | Discharge: 2017-06-28 | Disposition: A | Discharge status: Home / Self Care | Payer: 59 | Source: Ambulatory Visit | Attending: General Surgery | Admitting: General Surgery

## 2017-06-27 ENCOUNTER — Encounter (HOSPITAL_BASED_OUTPATIENT_CLINIC_OR_DEPARTMENT_OTHER)
Admission: RE | Disposition: A | Discharge status: Home / Self Care | Payer: Self-pay | Source: Ambulatory Visit | Attending: General Surgery

## 2017-06-27 ENCOUNTER — Ambulatory Visit (HOSPITAL_BASED_OUTPATIENT_CLINIC_OR_DEPARTMENT_OTHER): Payer: 59 | Admitting: Anesthesiology

## 2017-06-27 ENCOUNTER — Encounter (HOSPITAL_COMMUNITY)
Admission: RE | Admit: 2017-06-27 | Discharge: 2017-06-27 | Disposition: A | Discharge status: Home / Self Care | Payer: 59 | Source: Ambulatory Visit | Attending: General Surgery | Admitting: General Surgery

## 2017-06-27 DIAGNOSIS — C50511 Malignant neoplasm of lower-outer quadrant of right female breast: Secondary | ICD-10-CM | POA: Insufficient documentation

## 2017-06-27 DIAGNOSIS — Z170 Estrogen receptor positive status [ER+]: Secondary | ICD-10-CM | POA: Insufficient documentation

## 2017-06-27 DIAGNOSIS — Z9221 Personal history of antineoplastic chemotherapy: Secondary | ICD-10-CM | POA: Diagnosis not present

## 2017-06-27 DIAGNOSIS — Z1501 Genetic susceptibility to malignant neoplasm of breast: Secondary | ICD-10-CM | POA: Insufficient documentation

## 2017-06-27 DIAGNOSIS — Z803 Family history of malignant neoplasm of breast: Secondary | ICD-10-CM | POA: Diagnosis not present

## 2017-06-27 DIAGNOSIS — C50911 Malignant neoplasm of unspecified site of right female breast: Secondary | ICD-10-CM | POA: Diagnosis present

## 2017-06-27 DIAGNOSIS — Z87891 Personal history of nicotine dependence: Secondary | ICD-10-CM | POA: Insufficient documentation

## 2017-06-27 DIAGNOSIS — C50811 Malignant neoplasm of overlapping sites of right female breast: Secondary | ICD-10-CM

## 2017-06-27 HISTORY — PX: BREAST RECONSTRUCTION WITH PLACEMENT OF TISSUE EXPANDER AND FLEX HD (ACELLULAR HYDRATED DERMIS): SHX6295

## 2017-06-27 HISTORY — PX: NIPPLE SPARING MASTECTOMY/SENTINAL LYMPH NODE BIOPSY/RECONSTRUCTION/PLACEMENT OF TISSUE EXPANDER: SHX6484

## 2017-06-27 SURGERY — NIPPLE SPARING MASTECTOMY WITH SENTINAL LYMPH NODE BIOPSY AND  RECONSTRUCTION WITH PLACEMENT OF TISSUE EXPANDER
Anesthesia: General | Site: Breast | Laterality: Bilateral

## 2017-06-27 MED ORDER — CHLORHEXIDINE GLUCONATE CLOTH 2 % EX PADS: 6.0000 | MEDICATED_PAD | Freq: Once | CUTANEOUS | Status: DC

## 2017-06-27 MED ORDER — BUPIVACAINE HCL (PF) 0.5 % IJ SOLN
INTRAMUSCULAR | Status: AC
  Filled 2017-06-27: qty 30

## 2017-06-27 MED ORDER — ACETAMINOPHEN 325 MG PO TABS: 325.0000 mg | ORAL_TABLET | ORAL | Status: DC | PRN

## 2017-06-27 MED ORDER — METHOCARBAMOL 500 MG PO TABS
500.0000 mg | ORAL_TABLET | Freq: Three times a day (TID) | ORAL | Status: DC | PRN
  Administered 2017-06-27: 500 mg via ORAL
  Filled 2017-06-27: qty 1

## 2017-06-27 MED ORDER — SUFENTANIL CITRATE 50 MCG/ML IV SOLN
INTRAVENOUS | Status: AC
  Filled 2017-06-27: qty 1

## 2017-06-27 MED ORDER — GABAPENTIN 300 MG PO CAPS
300.0000 mg | ORAL_CAPSULE | Freq: Two times a day (BID) | ORAL | Status: DC
  Administered 2017-06-27: 300 mg via ORAL

## 2017-06-27 MED ORDER — CELECOXIB 400 MG PO CAPS
400.0000 mg | ORAL_CAPSULE | ORAL | Status: AC
  Administered 2017-06-27: 400 mg via ORAL

## 2017-06-27 MED ORDER — ENOXAPARIN SODIUM 40 MG/0.4ML ~~LOC~~ SOLN: 40.0000 mg | SUBCUTANEOUS | Status: DC

## 2017-06-27 MED ORDER — TECHNETIUM TC 99M SULFUR COLLOID FILTERED
1.0000 | Freq: Once | INTRAVENOUS | Status: AC | PRN
  Administered 2017-06-27: 1 via INTRADERMAL

## 2017-06-27 MED ORDER — ONDANSETRON 4 MG PO TBDP: 4.0000 mg | ORAL_TABLET | Freq: Four times a day (QID) | ORAL | Status: DC | PRN

## 2017-06-27 MED ORDER — ACETAMINOPHEN 160 MG/5ML PO SOLN: 325.0000 mg | ORAL | Status: DC | PRN

## 2017-06-27 MED ORDER — HYDROMORPHONE HCL 1 MG/ML IJ SOLN
INTRAMUSCULAR | Status: AC
  Filled 2017-06-27: qty 1

## 2017-06-27 MED ORDER — MEPERIDINE HCL 25 MG/ML IJ SOLN: 6.2500 mg | INTRAMUSCULAR | Status: DC | PRN

## 2017-06-27 MED ORDER — GABAPENTIN 300 MG PO CAPS
ORAL_CAPSULE | ORAL | Status: AC
  Filled 2017-06-27: qty 1

## 2017-06-27 MED ORDER — ACETAMINOPHEN 500 MG PO TABS
1000.0000 mg | ORAL_TABLET | ORAL | Status: AC
  Administered 2017-06-27: 1000 mg via ORAL

## 2017-06-27 MED ORDER — KCL IN DEXTROSE-NACL 20-5-0.45 MEQ/L-%-% IV SOLN
INTRAVENOUS | Status: DC
  Administered 2017-06-27: 18:00:00 via INTRAVENOUS
  Filled 2017-06-27: qty 1000

## 2017-06-27 MED ORDER — GABAPENTIN 300 MG PO CAPS: 300.0000 mg | ORAL_CAPSULE | ORAL | Status: DC

## 2017-06-27 MED ORDER — GABAPENTIN 300 MG PO CAPS
300.0000 mg | ORAL_CAPSULE | ORAL | Status: AC
  Administered 2017-06-27: 300 mg via ORAL

## 2017-06-27 MED ORDER — SUFENTANIL CITRATE 50 MCG/ML IV SOLN
INTRAVENOUS | Status: DC | PRN
  Administered 2017-06-27 (×2): 5 ug via INTRAVENOUS
  Administered 2017-06-27 (×2): 10 ug via INTRAVENOUS
  Administered 2017-06-27: 5 ug via INTRAVENOUS

## 2017-06-27 MED ORDER — ACETAMINOPHEN 500 MG PO TABS: 1000.0000 mg | ORAL_TABLET | ORAL | Status: DC

## 2017-06-27 MED ORDER — CELECOXIB 200 MG PO CAPS
ORAL_CAPSULE | ORAL | Status: AC
  Filled 2017-06-27: qty 2

## 2017-06-27 MED ORDER — CEFAZOLIN SODIUM-DEXTROSE 2-4 GM/100ML-% IV SOLN: 2.0000 g | INTRAVENOUS | Status: DC

## 2017-06-27 MED ORDER — SODIUM CHLORIDE 0.9 % IJ SOLN
INTRAMUSCULAR | Status: AC
  Filled 2017-06-27: qty 10

## 2017-06-27 MED ORDER — OXYCODONE HCL 5 MG PO TABS: 5.0000 mg | ORAL_TABLET | Freq: Once | ORAL | Status: DC | PRN

## 2017-06-27 MED ORDER — METHYLENE BLUE 0.5 % INJ SOLN
INTRAVENOUS | Status: AC
  Filled 2017-06-27: qty 10

## 2017-06-27 MED ORDER — KETOROLAC TROMETHAMINE 30 MG/ML IJ SOLN
30.0000 mg | Freq: Three times a day (TID) | INTRAMUSCULAR | Status: DC
  Administered 2017-06-27 – 2017-06-28 (×2): 30 mg via INTRAVENOUS
  Filled 2017-06-27: qty 1

## 2017-06-27 MED ORDER — OXYCODONE HCL 5 MG/5ML PO SOLN: 5.0000 mg | Freq: Once | ORAL | Status: DC | PRN

## 2017-06-27 MED ORDER — BUPIVACAINE HCL (PF) 0.25 % IJ SOLN
INTRAMUSCULAR | Status: DC | PRN
Start: 2017-06-27 — End: 2017-06-27
  Administered 2017-06-27 (×2): 30 mL

## 2017-06-27 MED ORDER — CELECOXIB 400 MG PO CAPS: 400.0000 mg | ORAL_CAPSULE | ORAL | Status: DC

## 2017-06-27 MED ORDER — PROPOFOL 10 MG/ML IV BOLUS
INTRAVENOUS | Status: DC | PRN
  Administered 2017-06-27: 120 mg via INTRAVENOUS

## 2017-06-27 MED ORDER — SUGAMMADEX SODIUM 200 MG/2ML IV SOLN
INTRAVENOUS | Status: DC | PRN
  Administered 2017-06-27: 150 mg via INTRAVENOUS

## 2017-06-27 MED ORDER — HYDROMORPHONE HCL 1 MG/ML IJ SOLN
0.5000 mg | INTRAMUSCULAR | Status: DC | PRN
  Administered 2017-06-27: 1 mg via INTRAVENOUS

## 2017-06-27 MED ORDER — SODIUM CHLORIDE 0.9 % IV SOLN
INTRAVENOUS | Status: DC | PRN
  Administered 2017-06-27: 500 mL

## 2017-06-27 MED ORDER — PROMETHAZINE HCL 25 MG/ML IJ SOLN: 6.2500 mg | INTRAMUSCULAR | Status: DC | PRN

## 2017-06-27 MED ORDER — BUPIVACAINE-EPINEPHRINE (PF) 0.5% -1:200000 IJ SOLN
INTRAMUSCULAR | Status: AC
  Filled 2017-06-27: qty 30

## 2017-06-27 MED ORDER — SUCCINYLCHOLINE CHLORIDE 20 MG/ML IJ SOLN
INTRAMUSCULAR | Status: DC | PRN
  Administered 2017-06-27: 100 mg via INTRAVENOUS

## 2017-06-27 MED ORDER — KETOROLAC TROMETHAMINE 30 MG/ML IJ SOLN: 30.0000 mg | Freq: Once | INTRAMUSCULAR | Status: DC | PRN

## 2017-06-27 MED ORDER — MIDAZOLAM HCL 2 MG/2ML IJ SOLN
INTRAMUSCULAR | Status: AC
  Filled 2017-06-27: qty 2

## 2017-06-27 MED ORDER — FENTANYL CITRATE (PF) 100 MCG/2ML IJ SOLN
INTRAMUSCULAR | Status: AC
  Filled 2017-06-27: qty 2

## 2017-06-27 MED ORDER — CEFAZOLIN SODIUM-DEXTROSE 2-3 GM-% IV SOLR
INTRAVENOUS | Status: DC | PRN
  Administered 2017-06-27: 2 g via INTRAVENOUS

## 2017-06-27 MED ORDER — LACTATED RINGERS IV SOLN
INTRAVENOUS | Status: DC
  Administered 2017-06-27 (×4): via INTRAVENOUS

## 2017-06-27 MED ORDER — ACETAMINOPHEN 500 MG PO TABS
ORAL_TABLET | ORAL | Status: AC
  Filled 2017-06-27: qty 2

## 2017-06-27 MED ORDER — DIPHENHYDRAMINE HCL 25 MG PO CAPS
25.0000 mg | ORAL_CAPSULE | Freq: Four times a day (QID) | ORAL | Status: DC | PRN
Start: 2017-06-27 — End: 2017-06-28

## 2017-06-27 MED ORDER — CEFAZOLIN SODIUM-DEXTROSE 1-4 GM/50ML-% IV SOLN
1.0000 g | Freq: Three times a day (TID) | INTRAVENOUS | Status: DC
  Administered 2017-06-27 – 2017-06-28 (×2): 1 g via INTRAVENOUS
  Filled 2017-06-27 (×2): qty 50

## 2017-06-27 MED ORDER — FENTANYL CITRATE (PF) 100 MCG/2ML IJ SOLN
50.0000 ug | INTRAMUSCULAR | Status: DC | PRN
  Administered 2017-06-27 (×2): 50 ug via INTRAVENOUS

## 2017-06-27 MED ORDER — FENTANYL CITRATE (PF) 100 MCG/2ML IJ SOLN: 25.0000 ug | INTRAMUSCULAR | Status: DC | PRN

## 2017-06-27 MED ORDER — KETOROLAC TROMETHAMINE 30 MG/ML IJ SOLN
INTRAMUSCULAR | Status: AC
  Filled 2017-06-27: qty 1

## 2017-06-27 MED ORDER — SCOPOLAMINE 1 MG/3DAYS TD PT72: 1.0000 | MEDICATED_PATCH | Freq: Once | TRANSDERMAL | Status: DC | PRN

## 2017-06-27 MED ORDER — HYDROCODONE-ACETAMINOPHEN 5-325 MG PO TABS
1.0000 | ORAL_TABLET | ORAL | Status: DC | PRN
  Administered 2017-06-27 – 2017-06-28 (×4): 2 via ORAL
  Filled 2017-06-27 (×4): qty 2

## 2017-06-27 MED ORDER — EPHEDRINE SULFATE 50 MG/ML IJ SOLN
INTRAMUSCULAR | Status: DC | PRN
  Administered 2017-06-27: 10 mg via INTRAVENOUS

## 2017-06-27 MED ORDER — CEFAZOLIN SODIUM-DEXTROSE 2-4 GM/100ML-% IV SOLN
INTRAVENOUS | Status: AC
  Filled 2017-06-27: qty 100

## 2017-06-27 MED ORDER — ROCURONIUM BROMIDE 100 MG/10ML IV SOLN
INTRAVENOUS | Status: DC | PRN
  Administered 2017-06-27: 20 mg via INTRAVENOUS
  Administered 2017-06-27: 30 mg via INTRAVENOUS

## 2017-06-27 MED ORDER — DIPHENHYDRAMINE HCL 50 MG/ML IJ SOLN: 25.0000 mg | Freq: Four times a day (QID) | INTRAMUSCULAR | Status: DC | PRN

## 2017-06-27 MED ORDER — MIDAZOLAM HCL 2 MG/2ML IJ SOLN
1.0000 mg | INTRAMUSCULAR | Status: DC | PRN
  Administered 2017-06-27 (×2): 2 mg via INTRAVENOUS

## 2017-06-27 MED ORDER — ONDANSETRON HCL 4 MG/2ML IJ SOLN: 4.0000 mg | Freq: Four times a day (QID) | INTRAMUSCULAR | Status: DC | PRN

## 2017-06-27 SURGICAL SUPPLY — 78 items
ALLODERM 8X16 MED THICK (Tissue) ×6 IMPLANT
BAG DECANTER FOR FLEXI CONT (MISCELLANEOUS) ×3 IMPLANT
BINDER BREAST LRG (GAUZE/BANDAGES/DRESSINGS) ×3 IMPLANT
BINDER BREAST XLRG (GAUZE/BANDAGES/DRESSINGS) IMPLANT
BLADE HEX COATED 2.75 (ELECTRODE) ×3 IMPLANT
BLADE SURG 10 STRL SS (BLADE) ×3 IMPLANT
BLADE SURG 15 STRL LF DISP TIS (BLADE) ×2 IMPLANT
BLADE SURG 15 STRL SS (BLADE) ×1
BNDG GAUZE ELAST 4 BULKY (GAUZE/BANDAGES/DRESSINGS) ×6 IMPLANT
CANISTER SUCT 1200ML W/VALVE (MISCELLANEOUS) ×9 IMPLANT
CHLORAPREP W/TINT 26ML (MISCELLANEOUS) ×6 IMPLANT
COVER BACK TABLE 60X90IN (DRAPES) ×6 IMPLANT
COVER MAYO STAND STRL (DRAPES) ×6 IMPLANT
COVER PROBE W GEL 5X96 (DRAPES) ×3 IMPLANT
DECANTER SPIKE VIAL GLASS SM (MISCELLANEOUS) ×3 IMPLANT
DERMABOND ADVANCED (GAUZE/BANDAGES/DRESSINGS) ×2
DERMABOND ADVANCED .7 DNX12 (GAUZE/BANDAGES/DRESSINGS) ×4 IMPLANT
DEVICE DISSECT PLASMABLAD 3.0S (MISCELLANEOUS) ×2 IMPLANT
DRAIN CHANNEL 19F RND (DRAIN) ×6 IMPLANT
DRAPE LAPAROSCOPIC ABDOMINAL (DRAPES) ×3 IMPLANT
DRAPE SURG 17X23 STRL (DRAPES) ×12 IMPLANT
DRAPE TOP ARMCOVERS (MISCELLANEOUS) ×3 IMPLANT
DRAPE U-SHAPE 76X120 STRL (DRAPES) ×3 IMPLANT
DRAPE UTILITY XL STRL (DRAPES) ×3 IMPLANT
DRSG PAD ABDOMINAL 8X10 ST (GAUZE/BANDAGES/DRESSINGS) ×9 IMPLANT
DRSG TEGADERM 4X10 (GAUZE/BANDAGES/DRESSINGS) ×15 IMPLANT
ELECT BLADE 4.0 EZ CLEAN MEGAD (MISCELLANEOUS) ×3
ELECT COATED BLADE 2.86 ST (ELECTRODE) ×3 IMPLANT
ELECT REM PT RETURN 9FT ADLT (ELECTROSURGICAL) ×3
ELECTRODE BLDE 4.0 EZ CLN MEGD (MISCELLANEOUS) ×2 IMPLANT
ELECTRODE REM PT RTRN 9FT ADLT (ELECTROSURGICAL) ×2 IMPLANT
EVACUATOR SILICONE 100CC (DRAIN) ×6 IMPLANT
EXPANDER TISSUE MX 400CC (Breast) ×4 IMPLANT
GAUZE SPONGE 4X4 12PLY STRL (GAUZE/BANDAGES/DRESSINGS) ×3 IMPLANT
GAUZE VASELINE 3X9 (GAUZE/BANDAGES/DRESSINGS) ×3 IMPLANT
GLOVE BIO SURGEON STRL SZ 6 (GLOVE) ×6 IMPLANT
GLOVE BIOGEL PI IND STRL 7.0 (GLOVE) ×2 IMPLANT
GLOVE BIOGEL PI IND STRL 8 (GLOVE) ×2 IMPLANT
GLOVE BIOGEL PI INDICATOR 7.0 (GLOVE) ×1
GLOVE BIOGEL PI INDICATOR 8 (GLOVE) ×1
GLOVE ECLIPSE 7.5 STRL STRAW (GLOVE) ×3 IMPLANT
GLOVE SURG SS PI 6.5 STRL IVOR (GLOVE) ×3 IMPLANT
GOWN STRL REUS W/ TWL LRG LVL3 (GOWN DISPOSABLE) ×8 IMPLANT
GOWN STRL REUS W/ TWL XL LVL3 (GOWN DISPOSABLE) ×2 IMPLANT
GOWN STRL REUS W/TWL LRG LVL3 (GOWN DISPOSABLE) ×4
GOWN STRL REUS W/TWL XL LVL3 (GOWN DISPOSABLE) ×1
IV NS 500ML (IV SOLUTION) ×1
IV NS 500ML BAXH (IV SOLUTION) ×2 IMPLANT
KIT FILL SYSTEM UNIVERSAL (SET/KITS/TRAYS/PACK) ×3 IMPLANT
LIGHT WAVEGUIDE WIDE FLAT (MISCELLANEOUS) ×3 IMPLANT
NEEDLE HYPO 25X1 1.5 SAFETY (NEEDLE) ×3 IMPLANT
NS IRRIG 1000ML POUR BTL (IV SOLUTION) ×3 IMPLANT
PACK BASIN DAY SURGERY FS (CUSTOM PROCEDURE TRAY) ×6 IMPLANT
PENCIL BUTTON HOLSTER BLD 10FT (ELECTRODE) ×3 IMPLANT
PIN SAFETY STERILE (MISCELLANEOUS) ×3 IMPLANT
PLASMABLADE 3.0S (MISCELLANEOUS) ×3
SHEET MEDIUM DRAPE 40X70 STRL (DRAPES) ×3 IMPLANT
SLEEVE SCD COMPRESS KNEE MED (MISCELLANEOUS) ×3 IMPLANT
SPONGE LAP 18X18 X RAY DECT (DISPOSABLE) ×9 IMPLANT
STAPLER VISISTAT 35W (STAPLE) ×3 IMPLANT
SUT ETHILON 2 0 FS 18 (SUTURE) ×6 IMPLANT
SUT ETHILON 3 0 PS 1 (SUTURE) ×3 IMPLANT
SUT MNCRL AB 4-0 PS2 18 (SUTURE) ×3 IMPLANT
SUT MON AB 4-0 PC3 18 (SUTURE) ×6 IMPLANT
SUT VIC AB 3-0 SH 27 (SUTURE) ×6
SUT VIC AB 3-0 SH 27X BRD (SUTURE) ×12 IMPLANT
SUT VICRYL 3-0 CR8 SH (SUTURE) ×6 IMPLANT
SUT VICRYL 4-0 PS2 18IN ABS (SUTURE) ×6 IMPLANT
SYR BULB IRRIGATION 50ML (SYRINGE) ×3 IMPLANT
SYR CONTROL 10ML LL (SYRINGE) ×3 IMPLANT
TAPE MEASURE VINYL STERILE (MISCELLANEOUS) ×3 IMPLANT
TISSUE ALLDRM 8X16 MED THICK (Tissue) ×4 IMPLANT
TISSUE EXPANDER MX 400CC (Breast) ×6 IMPLANT
TOWEL OR 17X24 6PK STRL BLUE (TOWEL DISPOSABLE) ×12 IMPLANT
TOWEL OR NON WOVEN STRL DISP B (DISPOSABLE) ×3 IMPLANT
TUBE CONNECTING 20X1/4 (TUBING) ×6 IMPLANT
UNDERPAD 30X30 (UNDERPADS AND DIAPERS) ×6 IMPLANT
YANKAUER SUCT BULB TIP NO VENT (SUCTIONS) ×6 IMPLANT

## 2017-06-27 NOTE — Anesthesia Postprocedure Evaluation (Signed)
Anesthesia Post Note  Patient: Olivia Frederick  Procedure(s) Performed: Procedure(s) (LRB): BILATERAL NIPPLE SPARING MASTECTOMY WITH RIGHT SENTINAL LYMPH NODE BIOPSY (Bilateral) BILATERAL BREAST RECONSTRUCTION WITH PLACEMENT OF TISSUE EXPANDER AND ALLODERM (Bilateral)     Patient location during evaluation: PACU Anesthesia Type: General Level of consciousness: awake and alert, oriented and patient cooperative Pain management: pain level controlled Vital Signs Assessment: post-procedure vital signs reviewed and stable Respiratory status: spontaneous breathing, nonlabored ventilation and respiratory function stable Cardiovascular status: blood pressure returned to baseline and stable Postop Assessment: no signs of nausea or vomiting Anesthetic complications: no    Last Vitals:  Vitals:   06/27/17 1715 06/27/17 1730  BP: 132/83 121/67  Pulse: 93 85  Resp: (!) 9 16  Temp:    SpO2: 100% 100%    Last Pain:  Vitals:   06/27/17 1730  TempSrc:   PainSc: 3                  Remingtyn Depaola,E. Michal Callicott

## 2017-06-27 NOTE — Op Note (Signed)
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 She has undergone neoadjuvant chemotherapy with good response of her cancer. She is clinically node negative throughout. She has also tested positive for the Chek 2 gene.  After extensive preoperative discussion regarding surgical management we have elected to proceed with bilateral total nipple sparing mastectomy with right axillary lymph node sentinel biopsy and immediate reconstruction. The procedure and its nature and recovery, potential complications have been discussed extensively with the patient and detailed elsewhere.    Procedure Detail:  Patient was brought to the operating room, placed in the supine position on the operating table, and general endotracheal anesthesia induced. Foley catheter was placed. She received broad-spectrum preoperative IV antibiotics. PAS were placed. The entire anterior chest and neck, upper arms and axilla were prepped down to the table and widely draped. The left breast was approached first. An inframammary incision was used extending medially from just medial to the edge of the areolar border and extending about 12-14 cm. The plasma blade was used to deepen the dissection down to the chest wall. The breast was then dissected up off the chest wall using the plasma blade out laterally to the anterior border of the latissimus and off of the serratus and pectoralis medially over to the sternum and extending superiorly up toward the clavicle stopping just beyond the edge of the breast at the site of her Port-A-Cath. The second  intercostal was marked preoperatively and avoided during the dissection. After the breast was completely detached from the chest wall the skin was retracted with a breastwork and retracting the edge of the breast with Allis clamps the skin's subcutaneous flap was dissected working superiorly and medially and laterally. As the dissection progressed the Inv[employer] lighted retractor was used for exposure. As dissection progressed up toward the nipple I then used progressively sized uterine sounds in a number of areas extending up to the superior edge of the dissection to aid in identifying the tissue planes. The flap was then progressively dissected using the plasma blade and occasionally scissor dissection. Dissection was carried close to the areolar skin and the nipple was detached from the breast. We then continued the dissection superiorly meeting our previous inframammary dissection and the breast was detached from the latissimus and from its superior attachments working around medially and the specimen removed. It was oriented with sutures. The wound was thoroughly irrigated and complete hemostasis obtained with cautery and a few figure-of-eight sutures of 3-0 Vicryl. Following this an identical incision and dissection was performed on the right side. As the dissection progressed up to the right axilla of the pectoralis was reflected medially and the clavipectoral fascia incised and axilla exposed. Using the neoprobe for guidance I was able to dissect down onto 4 separate nodes with increased counts with the 2 greatest counts being about 4000 and 2002 other nodes in the range of 2-400. There was no palpable adenopathy. At this point background counts in the axilla were less than 40. We then came across the low axilla with the plasma blade and the specimen was removed. This was also oriented with sutures. We then exposed the base of the nipple and a shave biopsy  was taken with an Allis clamp and sent as a separate  specimen. The wound was thoroughly irrigated and complete hemostasis assured.  Dr. Iran Planas then proceeded with reconstruction    Findings: As above  Estimated Blood Loss:  200 mL         Drains: Per Dr. Iran Planas  Blood Given: none          Specimens: #1 left total mastectomy   #2 right total mastectomy   #3 right axillary sentinel lymph nodes X 4   #4 nipple shave biopsy        Complications:  * No complications entered in OR log *         Disposition: PACU - hemodynamically stable.         Condition: stable

## 2017-06-27 NOTE — Interval H&P Note (Signed)
History and Physical Interval Note:  06/27/2017 9:10 AM  Olivia Frederick  has presented today for surgery, with the diagnosis of RIGHT BREAST CANCER  The various methods of treatment have been discussed with the patient and family. After consideration of risks, benefits and other options for treatment, the patient has consented to  Procedure(s): BILATERAL NIPPLE SPARING MASTECTOMY WITH RIGHT SENTINAL LYMPH NODE BIOPSY (Bilateral) BILATERAL BREAST RECONSTRUCTION WITH PLACEMENT OF TISSUE EXPANDER AND ALLODERM (Bilateral) as a surgical intervention .  The patient's history has been reviewed, patient examined, no change in status, stable for surgery.  I have reviewed the patient's chart and labs.  Questions were answered to the patient's satisfaction.     [name]

## 2017-06-27 NOTE — Anesthesia Procedure Notes (Signed)
Procedure Name: Intubation Date/Time: 06/27/2017 11:11 AM Performed by: Melynda Ripple D Pre-anesthesia Checklist: Patient identified, Emergency Drugs available, Suction available and Patient being monitored Patient Re-evaluated:Patient Re-evaluated prior to induction Oxygen Delivery Method: Circle system utilized Preoxygenation: Pre-oxygenation with 100% oxygen Induction Type: IV induction Ventilation: Mask ventilation without difficulty Laryngoscope Size: Mac and 3 Grade View: Grade I Tube type: Oral Tube size: 7.0 mm Number of attempts: 1 Airway Equipment and Method: Stylet and Oral airway Placement Confirmation: ETT inserted through vocal cords under direct vision,  positive ETCO2 and breath sounds checked- equal and bilateral Secured at: 22 cm Tube secured with: Tape Dental Injury: Teeth and Oropharynx as per pre-operative assessment

## 2017-06-27 NOTE — Anesthesia Preprocedure Evaluation (Signed)
Anesthesia Evaluation  Patient identified by MRN, date of birth, ID band Patient awake    Reviewed: Allergy & Precautions, NPO status , Patient's Chart, lab work & pertinent test results  Airway Mallampati: I       Dental no notable dental hx. (+) Teeth Intact   Pulmonary former smoker,    Pulmonary exam normal breath sounds clear to auscultation       Cardiovascular negative cardio ROS Normal cardiovascular exam Rhythm:Regular Rate:Normal     Neuro/Psych negative neurological ROS  negative psych ROS   GI/Hepatic negative GI ROS, Neg liver ROS,   Endo/Other  negative endocrine ROS  Renal/GU negative Renal ROS     Musculoskeletal negative musculoskeletal ROS (+)   Abdominal Normal abdominal exam  (+)   Peds  Hematology  (+) Blood dyscrasia, anemia ,   Anesthesia Other Findings   Reproductive/Obstetrics negative OB ROS                             Anesthesia Physical Anesthesia Plan  ASA: II  Anesthesia Plan: General   Post-op Pain Management:  Regional for Post-op pain   Induction:   PONV Risk Score and Plan: 3 and Ondansetron, Dexamethasone, Midazolam and Propofol infusion  Airway Management Planned: Oral ETT  Additional Equipment:   Intra-op Plan:   Post-operative Plan: Extubation in OR  Informed Consent: I have reviewed the patients History and Physical, chart, labs and discussed the procedure including the risks, benefits and alternatives for the proposed anesthesia with the patient or authorized representative who has indicated his/her understanding and acceptance.   Dental advisory given  Plan Discussed with: CRNA and Surgeon  Anesthesia Plan Comments:         Anesthesia Quick Evaluation

## 2017-06-27 NOTE — Op Note (Signed)
Operative Note   DATE OF OPERATION: 8.21.18  LOCATION: [facility]-observation  SURGICAL DIVISION: Plastic Surgery  PREOPERATIVE DIAGNOSES:  1. Right breast cancer 2. CHEK2 mutation  POSTOPERATIVE DIAGNOSES:  same  PROCEDURE:  1. Bilateral breast reconstruction with tissue expanders 2. Acellular dermis for breast reconstruction 120 cm2  SURGEON: Irene Limbo MD MBA  ASSISTANT: none  ANESTHESIA:  General.   EBL: 125 ml for entire procedure.  COMPLICATIONS: None immediate.   INDICATIONS FOR PROCEDURE:  The patient, Olivia Frederick, is a 46 y.o. female born on 07-Jun-1971, is here for immediate expander based reconstruction following bilateral nipple sparing mastectomies.    FINDINGS: Natrelle 133MX-12-T 400 ml tissue expanders, initial fill volume 180 ml bilateral. RIGHT SN [id number] LEFT SN [id number]  DESCRIPTION OF PROCEDURE:  The patient was marked with the patient in the preoperative area to mark sternal notch, chest midline, anterior axillary lines and inframammary folds. The patient was taken to the operating room. SCDs were placed and IV antibiotics were given. Foley catheter placed. The patient's operative site was prepped and draped in a sterile fashion. A time out was performed and all information was confirmed to be correct. Following completion of mastectomies, reconstruction began on left side.  Lower border pectoralis muscle identified and dissected free from overlying skin flap in limited fashion. Submuscular dissection then completed to accommodate the expander. Acellular dermis prepared and perforated. This was inset to chest wall with running 3-0 vicryl. The cavity was irrigated with Ancef, gentamicin, bacitracin solution and hemostasis ensured. 19Fr JP placed in cavity and secured with 2-0 nylon. The cavity was then irrigated with Betadine. Expander prepared and placed in submuscular cavity. This was secured to chest wall with 3-0 vicryl. Acellular dermis was  then wrapped over lower border expander and secured to lateral pectoralis muscle with 3-0 vicryl. Laterally the mastectomy flap over posterior axillary line was advanced anteriorly and the subcutaneous tissue and superficial fascia was secured to pectoralis muscle and acellular dermis with 0-vicryl. Skin closure completedwith 3-0 vicryl in fascial layer and 4-0 vicryl in dermis. Skin closure completed with 4-0 monocryl subcuticular and tissue adhesive.  I then directed my attention to right chest where similar submuscular dissection completed. There perforated ADM was then secured to chestwall with running 3-0 vicryl. The cavity was irrigated with Ancef, gentamicin, bacitracin solution and hemostasis ensured. 19Fr JP placed in cavity and secured with 2-0 nylon. The cavity was then irrigated with Betadine. The expander was placed in submuscular position and secured to chest wall with 3-0 vicryl. The ADM was redraped over expander and trimmed. It was sutured to lower border pectoralis with 3-0 vicryl. Laterally the mastectomy flap over posterior axillary line was advanced anteriorly and the subcutaneous tissue and superficial fascia was secured to pectoralis muscle and acellular dermis with 0-vicryl. Skin closure completedwith 3-0 vicryl in fascial layer and 4-0 vicryl in dermis. Skin closure completed with 4-0 monocryl subcuticular and tissue adhesive. The ports were accessed and filled to 180 ml of saline on each side. Tissue adhesive applied. The patient was brought to upright sitting position and nipple areolar complex was positioned symmetric from sternal notch and chest midline. Tegaderms applied. Patient returned to supine position. Dry dressing and breast binder applied.  The patient was allowed to wake from anesthesia, extubated and taken to the recovery room in satisfactory condition.   SPECIMENS: none  DRAINS: 66 Fr JP in right and left breast reconstruction  Irene Limbo, MD [facility] [phone], pin  [phone]

## 2017-06-27 NOTE — Interval H&P Note (Signed)
History and Physical Interval Note:  06/27/2017 9:56 AM  Olivia Frederick  has presented today for surgery, with the diagnosis of RIGHT BREAST CANCER  The various methods of treatment have been discussed with the patient and family. After consideration of risks, benefits and other options for treatment, the patient has consented to  Procedure(s): BILATERAL NIPPLE SPARING MASTECTOMY WITH RIGHT SENTINAL LYMPH NODE BIOPSY (Bilateral) BILATERAL BREAST RECONSTRUCTION WITH PLACEMENT OF TISSUE EXPANDER AND ALLODERM (Bilateral) as a surgical intervention .  The patient's history has been reviewed, patient examined, no change in status, stable for surgery.  I have reviewed the patient's chart and labs.  Questions were answered to the patient's satisfaction.     Alonnah Lampkins T

## 2017-06-27 NOTE — Progress Notes (Signed)
Assisted Dr. Lissa Hoard with right, left, ultrasound guided, pectoralis block. Side rails up, monitors on throughout procedure. See vital signs in flow sheet. Tolerated Procedure well.

## 2017-06-27 NOTE — Anesthesia Procedure Notes (Signed)
Anesthesia Regional Block: Pectoralis block   Pre-Anesthetic Checklist: ,, timeout performed, Correct Patient, Correct Site, Correct Laterality, Correct Procedure, Correct Position, site marked, Risks and benefits discussed,  Surgical consent,  Pre-op evaluation,  At surgeon's request and post-op pain management  Laterality: Left and Right  Prep: chloraprep       Needles:   Needle Type: Stimiplex     Needle Length: 9cm      Additional Needles:   Procedures: ultrasound guided,,,,,,,,  Narrative:  Start time: 06/27/2017 10:27 AM End time: 06/27/2017 10:42 AM Injection made incrementally with aspirations every 5 mL.  Performed by: Personally  Anesthesiologist: Nolon Nations  Additional Notes: Patient tolerated well. Good fascial spread noted.

## 2017-06-27 NOTE — Transfer of Care (Signed)
Immediate Anesthesia Transfer of Care Note  Patient: Olivia Frederick  Procedure(s) Performed: Procedure(s): BILATERAL NIPPLE SPARING MASTECTOMY WITH RIGHT SENTINAL LYMPH NODE BIOPSY (Bilateral) BILATERAL BREAST RECONSTRUCTION WITH PLACEMENT OF TISSUE EXPANDER AND ALLODERM (Bilateral)  Patient Location: PACU  Anesthesia Type:General  Level of Consciousness: awake, alert  and drowsy  Airway & Oxygen Therapy: Patient Spontanous Breathing and Patient connected to face mask oxygen  Post-op Assessment: Report given to RN and Post -op Vital signs reviewed and stable  Post vital signs: Reviewed and stable  Last Vitals:  Vitals:   06/27/17 1041 06/27/17 1045  BP: 110/60 115/63  Pulse:  71  Resp:  19  Temp:    SpO2:  100%    Last Pain:  Vitals:   06/27/17 1041  TempSrc:   PainSc: 0-No pain      Patients Stated Pain Goal: 3 (04/08/55 1537)  Complications: No apparent anesthesia complications

## 2017-06-28 ENCOUNTER — Encounter (HOSPITAL_BASED_OUTPATIENT_CLINIC_OR_DEPARTMENT_OTHER): Payer: Self-pay | Admitting: General Surgery

## 2017-06-28 DIAGNOSIS — C50511 Malignant neoplasm of lower-outer quadrant of right female breast: Secondary | ICD-10-CM | POA: Diagnosis not present

## 2017-06-28 NOTE — Discharge Instructions (Signed)

## 2017-07-13 ENCOUNTER — Other Ambulatory Visit (HOSPITAL_BASED_OUTPATIENT_CLINIC_OR_DEPARTMENT_OTHER): Payer: 59

## 2017-07-13 ENCOUNTER — Ambulatory Visit (HOSPITAL_BASED_OUTPATIENT_CLINIC_OR_DEPARTMENT_OTHER): Payer: 59

## 2017-07-13 ENCOUNTER — Encounter: Payer: Self-pay | Admitting: Oncology

## 2017-07-13 ENCOUNTER — Ambulatory Visit (HOSPITAL_BASED_OUTPATIENT_CLINIC_OR_DEPARTMENT_OTHER): Payer: 59 | Admitting: Oncology

## 2017-07-13 VITALS — BP 126/59 | HR 85 | Temp 98.9°F | Resp 17 | Ht 66.0 in | Wt 147.4 lb

## 2017-07-13 DIAGNOSIS — C50511 Malignant neoplasm of lower-outer quadrant of right female breast: Secondary | ICD-10-CM

## 2017-07-13 DIAGNOSIS — Z5112 Encounter for antineoplastic immunotherapy: Secondary | ICD-10-CM | POA: Diagnosis not present

## 2017-07-13 DIAGNOSIS — Z170 Estrogen receptor positive status [ER+]: Principal | ICD-10-CM

## 2017-07-13 DIAGNOSIS — C50411 Malignant neoplasm of upper-outer quadrant of right female breast: Secondary | ICD-10-CM

## 2017-07-13 LAB — COMPREHENSIVE METABOLIC PANEL
ALT: 20 U/L (ref 0–55)
AST: 16 U/L (ref 5–34)
Albumin: 3.5 g/dL (ref 3.5–5.0)
Alkaline Phosphatase: 58 U/L (ref 40–150)
Anion Gap: 9 mEq/L (ref 3–11)
BUN: 9.5 mg/dL (ref 7.0–26.0)
CALCIUM: 9.8 mg/dL (ref 8.4–10.4)
CHLORIDE: 105 meq/L (ref 98–109)
CO2: 27 meq/L (ref 22–29)
CREATININE: 0.8 mg/dL (ref 0.6–1.1)
EGFR: >90 mL/min/{1.73_m2} (ref >90)
Glucose: 108 mg/dl (ref 70–140)
POTASSIUM: 4.2 meq/L (ref 3.5–5.1)
Sodium: 141 mEq/L (ref 136–145)
Total Bilirubin: 0.39 mg/dL (ref 0.20–1.20)
Total Protein: 7.1 g/dL (ref 6.4–8.3)

## 2017-07-13 LAB — CBC WITH DIFFERENTIAL/PLATELET
BASO%: 0.6 % (ref 0.0–2.0)
BASOS ABS: 0 10*3/uL (ref 0.0–0.1)
EOS%: 3.2 % (ref 0.0–7.0)
Eosinophils Absolute: 0.2 10*3/uL (ref 0.0–0.5)
HCT: 27.2 % — ABNORMAL LOW (ref 34.8–46.6)
HGB: 9.3 g/dL — ABNORMAL LOW (ref 11.6–15.9)
LYMPH%: 27.8 % (ref 14.0–49.7)
MCH: 27.7 pg (ref 25.1–34.0)
MCHC: 34.4 g/dL (ref 31.5–36.0)
MCV: 80.5 fL (ref 79.5–101.0)
MONO#: 0.4 10*3/uL (ref 0.1–0.9)
MONO%: 7.7 % (ref 0.0–14.0)
NEUT#: 3 10*3/uL (ref 1.5–6.5)
NEUT%: 60.7 % (ref 38.4–76.8)
Platelets: 278 10*3/uL (ref 145–400)
RBC: 3.38 10*6/uL — AB (ref 3.70–5.45)
RDW: 16.1 % — ABNORMAL HIGH (ref 11.2–14.5)
WBC: 4.9 10*3/uL (ref 3.9–10.3)
lymph#: 1.4 10*3/uL (ref 0.9–3.3)

## 2017-07-13 MED ORDER — SODIUM CHLORIDE 0.9% FLUSH
10.0000 mL | INTRAVENOUS | Status: DC | PRN
  Administered 2017-07-13: 10 mL
  Filled 2017-07-13: qty 10

## 2017-07-13 MED ORDER — HEPARIN SOD (PORK) LOCK FLUSH 100 UNIT/ML IV SOLN
500.0000 [IU] | Freq: Once | INTRAVENOUS | Status: AC | PRN
  Administered 2017-07-13: 500 [IU]
  Filled 2017-07-13: qty 5

## 2017-07-13 MED ORDER — ACETAMINOPHEN 325 MG PO TABS
650.0000 mg | ORAL_TABLET | Freq: Once | ORAL | Status: AC
  Administered 2017-07-13: 650 mg via ORAL

## 2017-07-13 MED ORDER — DIPHENHYDRAMINE HCL 25 MG PO CAPS
ORAL_CAPSULE | ORAL | Status: AC
  Filled 2017-07-13: qty 1

## 2017-07-13 MED ORDER — DIPHENHYDRAMINE HCL 25 MG PO CAPS
25.0000 mg | ORAL_CAPSULE | Freq: Once | ORAL | Status: AC
  Administered 2017-07-13: 25 mg via ORAL

## 2017-07-13 MED ORDER — TAMOXIFEN CITRATE 20 MG PO TABS: 20.0000 mg | ORAL_TABLET | Freq: Every day | ORAL | 12 refills | Status: DC

## 2017-07-13 MED ORDER — ACETAMINOPHEN 325 MG PO TABS
ORAL_TABLET | ORAL | Status: AC
  Filled 2017-07-13: qty 2

## 2017-07-13 MED ORDER — SODIUM CHLORIDE 0.9 % IV SOLN
Freq: Once | INTRAVENOUS | Status: AC
  Administered 2017-07-13: 13:00:00 via INTRAVENOUS

## 2017-07-13 MED ORDER — TRASTUZUMAB CHEMO 150 MG IV SOLR
6.0000 mg/kg | Freq: Once | INTRAVENOUS | Status: AC
  Administered 2017-07-13: 420 mg via INTRAVENOUS
  Filled 2017-07-13: qty 20

## 2017-07-13 NOTE — Progress Notes (Signed)
[place]  Telephone:([phone]) [phone] Fax:(336) [phone]     ID: Olivia [facility] DOB: [birth year]  MR#: [id number]  PPI#:[id number]  Patient Care Team: Carollee Herter, Alferd Apa, DO as PCP - General (Family Medicine) Excell Seltzer, MD as Consulting Physician (General Surgery) Findlay Dagher, Virgie Dad, MD as Consulting Physician (Oncology) Gery Pray, MD as Consulting Physician (Radiation Oncology) Chauncey Cruel, MD OTHER MD:  CHIEF COMPLAINT: Triple positive breast cancer  CURRENT TREATMENT: Neoadjuvant chemotherapy   BREAST CANCER HISTORY: From Olivia original intake note:  Olivia [facility] noted a change in her right breast around November 2017. She was not very concerned about it but did mention it at Olivia time of mammography which had been scheduled for 01/30/2017. Accordingly she had bilateral diagnostic mammography with tomography and right breast ultrasonography that day at [facility]. In Olivia right breast there was an irregular mass measuring approximately 5 cm. On exam this was firm and palpable at Olivia 12:00 location 2 cm from Olivia nipple targeted ultrasonography showed an area of mixed echogenicity superiorly measuring at least 4.6 cm with a similar appearing adjacent area measuring 1.3 cm thought to be contiguous ultrasound of Olivia right axilla showed one ymph node with cortical thickening.  Biopsy of Olivia right breast mass 01/31/2017 showed ([id number]) invasive ductal carcinoma, grade 2, estrogen receptor 100% positive, with strong staining intensity, progesterone receptor 25% positive, with moderate staining intensity, with an MIB-1 of 25%, and HER-2 amplified with a signals ratio of 2.35, Olivia number per cell being 4.0. Biopsy of Olivia suspicious axillary lymph node showed only reactive  Her subsequent history is as detailed below  INTERVAL HISTORY: Olivia Frederick returns today for follow-up and treatment of her triple positive breast cancer. Today is day 1 cycle 6 of 6  planned cycles of carboplatin, docetaxel, trastuzumab, and Pertuzumab.  Breast MRI obtained 05/23/2017 shows significant response but there remains multicentric disease. It will be difficult to avoid mastectomy.  REVIEW OF SYSTEMS: [facility] complains of increasing numbness and tingling in her finger pads and to some extent her toe pads. She is about to lose her left large toenail. She is very concerned about reconstruction possibilities and tells me she feels very confused about how to proceed. Aside from that she remains entirely functional, working full-time, and planning on a vacation next week. A detailed review of systems today was otherwise stable.  PAST MEDICAL HISTORY: Past Medical History:  Diagnosis Date  . Cervical dysplasia   . Genetic testing 04/24/2017   Olivia Frederick underwent genetic counseling and testing for hereditary cancer syndromes on 03/08/2017. 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   Olivia remaining 45 genes analyzed by [facility]'s 46-gene Common Hereditary Cancers Panel were negative for mutations. Genes analyzed include: APC, ATM, AXIN2, BARD1, BMPR1A, BRCA1, BRCA2, BRIP1, CDH1, CDKN2    PAST SURGICAL HISTORY: Past Surgical History:  Procedure Laterality Date  . BREAST EXCISIONAL BIOPSY Right   . BREAST RECONSTRUCTION WITH PLACEMENT OF TISSUE EXPANDER AND FLEX HD (ACELLULAR HYDRATED DERMIS) Bilateral 06/27/2017   Procedure: BILATERAL BREAST RECONSTRUCTION WITH PLACEMENT OF TISSUE EXPANDER AND ALLODERM;  Surgeon: Irene Limbo, MD;  Location: [facility];  Service: Plastics;  Laterality: Bilateral;  . BREAST SURGERY     right breast benign tumor   . CESAREAN SECTION     x two  . NIPPLE SPARING MASTECTOMY/SENTINAL LYMPH NODE BIOPSY/RECONSTRUCTION/PLACEMENT OF TISSUE EXPANDER Bilateral 06/27/2017   Procedure: BILATERAL NIPPLE SPARING MASTECTOMY WITH RIGHT SENTINAL LYMPH NODE BIOPSY;  Surgeon: Excell Seltzer, MD;  Location: [facility];  Service: General;  Laterality: Bilateral;    FAMILY HISTORY Family History  Problem Relation Age of Onset  . Hypertension Mother   . Diabetes Father   . Leukemia Paternal Grandfather        d.64  . Breast cancer Cousin 40       Bilateral breast cancer. Daughter of paternal uncle with leukemia.  . Leukemia Paternal Uncle 6  . Lung cancer Maternal Uncle 74  . Brain cancer Maternal Aunt 65       d.66  . Cancer Maternal Uncle        unspecified type  . Brain cancer Cousin 34       d.55 daughter of maternal aunt with brain cancer  Olivia [facility] parents are living, both in their early 14s as of April 2018. She has one brother and one sister. On Olivia paternal side a grandfather was diagnosed with leukemia at age 35, an uncle also with leukemia at age 75, another uncle at age 31 with colon cancer, and a cousin with breast cancer at age 61. On Olivia mother's side there was an uncle with cancer of some type, not known to Olivia patient, diagnosed age 38   GYNECOLOGIC HISTORY:  No LMP recorded. Patient is not currently having periods (Reason: Chemotherapy).  Menarche age 19. She is GX P2, first live birth age 17. As of April 2018 she is having regular periods. She never used oral contraceptives.    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 Son [name] is 34, daughter cavity is 73, as of April 2018.     ADVANCED DIRECTIVES: Not in place    HEALTH MAINTENANCE: Social History  Substance Use Topics  . Smoking status: Former Smoker    Quit date: 02/01/2017  . Smokeless tobacco: Never Used  . Alcohol use 3.6 oz/week    6 Glasses of wine per week     Comment: socially     Colonoscopy: n/a  PAP:  Bone density:   No Known Allergies  Current Outpatient Prescriptions  Medication Sig Dispense Refill  . diphenhydrAMINE (BENADRYL) 25 mg capsule Take 25 mg by mouth as needed.     No current facility-administered  medications for this visit.     OBJECTIVE: Young-appearing African-American woman in no acute distress There were no vitals filed for this visit.   There is no height or weight on file to calculate BMI.    There were no vitals filed for this visit.   ECOG FS:1 - Symptomatic but completely ambulatory   Sclerae unicteric, EOMs intact Oropharynx clear and moist No cervical or supraclavicular adenopathy Lungs no rales or rhonchi Heart regular rate and rhythm Abd soft, nontender, positive bowel sounds MSK no focal spinal tenderness, no upper extremity lymphedema Neuro: nonfocal, well oriented, appropriate affect Breasts: Olivia changes in Olivia periareolar area of Olivia right breast, hard to detect. Olivia left breast is benign. Both axillae are benign.  LAB RESULTS:  CMP     Component Value Date/Time   NA 140 06/22/2017 1430   K 4.1 06/22/2017 1430   CO2 26 06/22/2017 1430   GLUCOSE 109 06/22/2017 1430   BUN 13.3 06/22/2017 1430   CREATININE 0.8 06/22/2017 1430   CALCIUM 9.8 06/22/2017 1430   PROT 7.4 06/22/2017 1430   ALBUMIN 3.9 06/22/2017 1430   AST 14 06/22/2017 1430   ALT 14 06/22/2017 1430   ALKPHOS  76 06/22/2017 1430   BILITOT 0.59 06/22/2017 1430    No results found for: TOTALPROTELP, ALBUMINELP, A1GS, A2GS, BETS, BETA2SER, GAMS, MSPIKE, SPEI  No results found for: Nils Pyle, [facility]  Lab Results  Component Value Date   WBC 4.9 07/13/2017   NEUTROABS 3.0 07/13/2017   HGB 9.3 (L) 07/13/2017   HCT 27.2 (L) 07/13/2017   MCV 80.5 07/13/2017   PLT 278 07/13/2017      Chemistry      Component Value Date/Time   NA 140 06/22/2017 1430   K 4.1 06/22/2017 1430   CO2 26 06/22/2017 1430   BUN 13.3 06/22/2017 1430   CREATININE 0.8 06/22/2017 1430      Component Value Date/Time   CALCIUM 9.8 06/22/2017 1430   ALKPHOS 76 06/22/2017 1430   AST 14 06/22/2017 1430   ALT 14 06/22/2017 1430   BILITOT 0.59 06/22/2017 1430       No results found for:  LABCA2  No components found for: [id number]  No results for input(s): INR in Olivia last 168 hours.  Urinalysis    Component Value Date/Time   COLORURINE yellow 01/22/2010 1457   APPEARANCEUR Clear 01/22/2010 1457   LABSPEC 1.015 01/22/2010 1457   PHURINE 5.0 01/22/2010 1457   GLUCOSEU NEGATIVE 05/24/2007 1404   HGBUR trace-lysed 01/22/2010 1457   BILIRUBINUR negative 01/22/2010 [place] 05/24/2007 1404   PROTEINUR NEGATIVE 05/24/2007 1404   UROBILINOGEN 0.2 01/22/2010 1457   NITRITE negative 01/22/2010 1457   LEUKOCYTESUR  05/24/2007 1404    NEGATIVE MICROSCOPIC NOT DONE ON URINES WITH NEGATIVE PROTEIN, BLOOD, LEUKOCYTES, NITRITE, OR GLUCOSE <1000 mg/dL.     STUDIES: No results found.   ELIGIBLE FOR AVAILABLE RESEARCH PROTOCOL: No  ASSESSMENT: 46 y.o. CHEK2 positive [place] woman status post right breast biopsy 02/01/2017 for a cT2 pN0, stage IB invasive ductal carcinoma, grade 2, estrogen and progesterone receptor positive, with an MIB-1 of 25%, and HER-2 amplified   (a) biopsy of a suspicious right axillary lymph node 01/24/2017 showed only reactive lymphoid tissue  (b) biopsy of right breast lower outer quadrant 02/17/2017 showed invasive ductal carcinoma, grade 3, estrogen and progesterone receptor positive, HER-2 not amplified, with an Mib-1 of 50%  (1) genetics testing 46-gene Common Hereditary Cancer Panel offered by [facility] identified a single, heterozygous pathogenic gene mutation called CHEK2, c.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 There were no additional mutations in Olivia remaining 45 genes analyzed: APC, ATM, AXIN2, BARD1, BMPR1A, BRCA1, BRCA2, BRIP1, CDH1, CDKN2A, CTNNA1, DICER1, EPCAM, GREM1, HOXB13, KIT, MEN1, MLH1, MSH2, MSH3, MSH6, MUTYH, NBN, NF1, NTHL1, PALB2, PDGFRA, PMS2, POLD1, POLE, PTEN, RAD50, RAD51C, RAD51D, SDHA, SDHB, SDHC, SDHD, SMAD4, SMARCA4, STK11, TP53, TSC1, TSC2, and VHL.  (a) will need either bilateral mastectomies or intensified  screening  (b) colon cancer screening to begin at age 21 and repeated every 5 years (10% lifetime risk).  (2) neoadjuvant chemotherapy will consist of carboplatin, docetaxel, and trastuzumab with pertuzumab given every 3 weeks 6, starting 02/16/2017, completed 06/01/2017  (3) anti-HER-2 immunotherapy to continue to total 1 year  (a) baseline echocardiogram 02/13/2017 found an ejection fraction of 55-60%  (b) echocardiogram 05/23/2017 found an ejection fraction of 55-60%.  (4) status post bilateral nipple sparing mastectomies with right sentinel lymph node sampling 06/27/2017, showing  (a) on Olivia left, no evidence of malignancy  (b) on Olivia right, residual mpT2 pN0 invasive ductal carcinoma, grade 2, with ample margins  (5) adjuvant radiation as appropriate  (6) anti-estrogens to follow Olivia completion of local  treatment   PLAN: Ashantae did remarkably well with her surgery, and is already planning to get back to work 08/07/2017.  We reviewed Olivia results of Olivia pathology in detail. She understands she had 7 lymph nodes removed from Olivia right axilla. She will use Olivia left arm preferentially for blood draws, blood pressure determination, etc.  I have asked Dr. Sondra Come to let us know if he feels she will need postmastectomy radiation. I suspect Olivia answer will be know since all her lymph nodes were negative and although she had multifocal disease none of Olivia lesions seem to be a 5 cm or more.   We discussed anti-estrogens today. She understands these will decrease her risk of breast cancer recurring by half. Because she had a period as recently as March of this year, we are going to go with tamoxifen. We discussed Olivia possible toxicities, side effects and complications of this agent in detail. She will started October 15, a couple of weeks after she goes back to work.  She will be due for repeat echocardiogram late this month.  Because there were relatively few women her age in Olivia [place]  study, Olivia plan will be to continue trastuzumab to total a year, which will take [place] to April 2019  She will see me again in early November. She knows to call for any problems that may develop before that visit.    Chauncey Cruel, MD   07/13/2017 11:31 AM Medical Oncology and Hematology [facility] [address] [place], [place] [address] Tel. [phone]    Fax. [phone]

## 2017-07-13 NOTE — Patient Instructions (Signed)
[facility] Discharge Instructions for Patients Receiving Chemotherapy  Today you received the following chemotherapy agents trastuzumab (Herceptin).   To help prevent nausea and vomiting after your treatment, we encourage you to take your nausea medication as prescribed.   If you develop nausea and vomiting that is not controlled by your nausea medication, call the clinic.   BELOW ARE SYMPTOMS THAT SHOULD BE REPORTED IMMEDIATELY:  *FEVER GREATER THAN 100.5 F  *CHILLS WITH OR WITHOUT FEVER  NAUSEA AND VOMITING THAT IS NOT CONTROLLED WITH YOUR NAUSEA MEDICATION  *UNUSUAL SHORTNESS OF BREATH  *UNUSUAL BRUISING OR BLEEDING  TENDERNESS IN MOUTH AND THROAT WITH OR WITHOUT PRESENCE OF ULCERS  *URINARY PROBLEMS  *BOWEL PROBLEMS  UNUSUAL RASH Items with * indicate a potential emergency and should be followed up as soon as possible.  Feel free to call the clinic you have any questions or concerns. The clinic phone number is [phone].  Please show the [facility] at check-in to the Emergency Department and triage nurse.

## 2017-07-14 ENCOUNTER — Encounter: Payer: Self-pay | Admitting: *Deleted

## 2017-07-19 ENCOUNTER — Ambulatory Visit: Payer: 59 | Attending: Plastic Surgery | Admitting: Physical Therapy

## 2017-07-19 DIAGNOSIS — M25512 Pain in left shoulder: Secondary | ICD-10-CM

## 2017-07-19 DIAGNOSIS — Z483 Aftercare following surgery for neoplasm: Secondary | ICD-10-CM | POA: Insufficient documentation

## 2017-07-19 DIAGNOSIS — M25511 Pain in right shoulder: Secondary | ICD-10-CM | POA: Diagnosis present

## 2017-07-19 DIAGNOSIS — M25612 Stiffness of left shoulder, not elsewhere classified: Secondary | ICD-10-CM

## 2017-07-19 DIAGNOSIS — M25611 Stiffness of right shoulder, not elsewhere classified: Secondary | ICD-10-CM | POA: Diagnosis present

## 2017-07-19 NOTE — Therapy (Addendum)
[place], [place], [address] Phone: [phone]   Fax:  [phone]  Physical Therapy Evaluation  Patient Details  Name: Olivia Frederick MRN: [id number] Date of Birth: Mar 06, 1971 Referring Provider: Dr. Irene Limbo  Encounter Date: 07/19/2017      PT End of Session - 07/19/17 1317    Visit Number 1   Number of Visits 9   Date for PT Re-Evaluation 08/25/17   PT Start Time 0845   PT Stop Time 0935   PT Time Calculation (min) 50 min   Activity Tolerance Patient tolerated treatment well   Behavior During Therapy [facility] for tasks assessed/performed      Past Medical History:  Diagnosis Date  . Cervical dysplasia   . Genetic testing 04/24/2017   Ms. Ladd underwent genetic counseling and testing for hereditary cancer syndromes on 03/08/2017. 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   The remaining 45 genes analyzed by [facility]'s 46-gene Common Hereditary Cancers Panel were negative for mutations. Genes analyzed include: APC, ATM, AXIN2, BARD1, BMPR1A, BRCA1, BRCA2, BRIP1, CDH1, CDKN2    Past Surgical History:  Procedure Laterality Date  . BREAST EXCISIONAL BIOPSY Right   . BREAST RECONSTRUCTION WITH PLACEMENT OF TISSUE EXPANDER AND FLEX HD (ACELLULAR HYDRATED DERMIS) Bilateral 06/27/2017   Procedure: BILATERAL BREAST RECONSTRUCTION WITH PLACEMENT OF TISSUE EXPANDER AND ALLODERM;  Surgeon: Irene Limbo, MD;  Location: [place];  Service: Plastics;  Laterality: Bilateral;  . BREAST SURGERY     right breast benign tumor   . CESAREAN SECTION     x two  . NIPPLE SPARING MASTECTOMY/SENTINAL LYMPH NODE BIOPSY/RECONSTRUCTION/PLACEMENT OF TISSUE EXPANDER Bilateral 06/27/2017   Procedure: BILATERAL NIPPLE SPARING MASTECTOMY WITH RIGHT SENTINAL LYMPH NODE BIOPSY;  Surgeon: Excell Seltzer, MD;  Location: [place];  Service: General;  Laterality: Bilateral;    There were no vitals filed for this visit.       Subjective Assessment - 07/19/17 0851    Subjective I'm still very tight and it's a bit difficult for me to turn the wheel when driving and really reach for stuff.  I just feel so tight and sore. I feel swollen on my right side, but my doctor said that was normal. Maybe stretching will alleviate.   Pertinent History Diagnosed with right breast cancer end of March 2018.  Neo-adjuvant chemotherapy completed 06/01/17.  Bilateral mastectomy with immediate expander placement; seven lymph nodes removed on 06/27/17.  Has not had two fills, the most recent 07/17/17. Will start tamoxifen 08/21/17.  Will continue herceptin through April. Otherwise healthy.   Patient Stated Goals To be able to stretch my arms above my head to the tip top without hurting at all.   Currently in Pain? Yes   Pain Score 7    Pain Location Breast   Pain Orientation Right   Pain Descriptors / Indicators Sore;Tightness   Aggravating Factors  getting expanders filled   Pain Relieving Factors medication to relax            [facility] PT Assessment - 07/19/17 0001      Assessment   Medical Diagnosis right breast cancer   Referring Provider Dr. Irene Limbo   Onset Date/Surgical Date 06/27/17  bilat. mastectomy, SLNB x 7   Hand Dominance Right   Prior Therapy none     Precautions   Precautions Other (comment)   Precaution Comments cancer precautions     Restrictions  Weight Bearing Restrictions No     Balance Screen   Has the patient fallen in the past 6 months No   Has the patient had a decrease in activity level because of a fear of falling?  No   Is the patient reluctant to leave their home because of a fear of falling?  No     Home Environment   Living Environment Private residence   Living Arrangements Spouse/significant other;Children  daughter age 55 at home; son has moved out   Type of [place] One level     Prior Function   Level of Independence Independent   Vocation Full time employment  on [employer] currently; goes back 08/07/17   Teaching laboratory technician, desk work, no heavy lifting   Leisure not currently; prior to treatment, she loved walking and has a Automotive engineer Status Within Functional Limits for tasks assessed     Observation/Other Assessments   Observations slow to unbutton blouse because nails are about to fall off from chemo; nipple-sparing mastectomy healing well   Skin Integrity intact; incisions at inferior breast areas healing well; drain sites look like they still have some scabbing   Quick DASH  68.18     Posture/Postural Control   Posture/Postural Control Postural limitations   Postural Limitations Rounded Shoulders   Posture Comments guarding against pain from expanders     ROM / Strength   AROM / PROM / Strength AROM     AROM   AROM Assessment Site Shoulder   Right/Left Shoulder Right;Left   Right Shoulder Flexion 90 Degrees  discomfort   Right Shoulder ABduction 61 Degrees  discomfort   Right Shoulder Internal Rotation --  WFL in recline position   Right Shoulder External Rotation 65 Degrees  approx., in recline   Left Shoulder Flexion 95 Degrees  discomfort   Left Shoulder ABduction 78 Degrees   Left Shoulder Internal Rotation --  WFL in recline   Left Shoulder External Rotation 58 Degrees  approx., in recline     Palpation   Palpation comment very limited soft tissue mobility around breast reconstruction areas     Transfers   Comments unable to lie flat due to discomfort; needs assist to come to sitting from recline     Ambulation/Gait   Ambulation/Gait Yes   Gait Comments moves slowly, guarding against breast pain           LYMPHEDEMA/ONCOLOGY QUESTIONNAIRE - 07/19/17 0907      Type   Cancer Type right breast triple positive     Surgeries   Mastectomy Date  06/27/17   Number Lymph Nodes Removed 7     Treatment   Active Chemotherapy Treatment Yes   Date --  herceptin through April   Past Chemotherapy Treatment Yes   Date 06/01/17   Active Radiation Treatment No   Past Radiation Treatment No   Current Hormone Treatment Yes   Drug Name herceptin, will start tamoxifen in October     Lymphedema Assessments   Lymphedema Assessments Upper extremities     Right Upper Extremity Lymphedema   10 cm Proximal to Olecranon Process 26.2 cm   Olecranon Process 24.1 cm   10 cm Proximal to Ulnar Styloid Process 20 cm   Just Proximal to Ulnar Styloid Process 15.8 cm   Across Hand at [employer] 19.8 cm   At [place] of 2nd Digit 6.3 cm  Left Upper Extremity Lymphedema   10 cm Proximal to Olecranon Process 25.3 cm   Olecranon Process 23.3 cm   10 cm Proximal to Ulnar Styloid Process 19.2 cm   Just Proximal to Ulnar Styloid Process 15.3 cm   Across Hand at [employer] 18.1 cm   At [place] of 2nd Digit 6 cm           Quick Dash - 07/19/17 0001    Open a tight or new jar Unable   Do heavy household chores (wash walls, wash floors) Severe difficulty   Carry a shopping bag or briefcase Mild difficulty   Wash your back Unable   Use a knife to cut food Moderate difficulty   Recreational activities in which you take some force or impact through your arm, shoulder, or hand (golf, hammering, tennis) Unable   During the past week, to what extent has your arm, shoulder or hand problem interfered with your normal social activities with family, friends, neighbors, or groups? Modererately   During the past week, to what extent has your arm, shoulder or hand problem limited your work or other regular daily activities Modererately   Arm, shoulder, or hand pain. Severe   Tingling (pins and needles) in your arm, shoulder, or hand Moderate   Difficulty Sleeping Severe difficulty   DASH Score 68.18 %      Objective measurements completed on  examination: See above findings.                          Long Term Clinic Goals - 07/19/17 1326      CC Long Term Goal  #1   Title Pt. will be knowledgeable about lymphedema risk reduction practices.   Time 4   Period Weeks   Target Date 08/25/17     CC Long Term Goal  #2   Title Pt. will be independent in HEP for bilat. shoulder ROM.   Time 4   Period Weeks   Status New   Target Date 08/25/17     CC Long Term Goal  #3   Title Quick DASH score will reduce to 20 or less.   Baseline 68.18 at eval   Time 4   Period Weeks   Target Date 08/25/17     CC Long Term Goal  #4   Title bilateral shoulder flexion to at least 140 degrees for improved overhead reach   Baseline 90 degrees right, 95 left at eval   Time 4   Period Weeks   Status New   Target Date 08/25/17     CC Long Term Goal  #5   Title bilateral shoulder abduction to at least 140 degrees for improved ADLs   Baseline 60 degrees right, 78 left at eval   Time 4   Period Weeks   Status New             Plan - 07/19/17 1318    Clinical Impression Statement This is a pleasant young woman s/p neo-adjuvant chemo, bilateral mastectomy with immediate expander placement, and 7 lymphnodes removed.  She has significant discomfort on both sides today, in part because she had an expander fill two days ago.  She also has significantly limited A/ROM both shoulders. Her quick DASH score is high at 68.18. She would like to regain full ROM and reach without pain. She will benefit from therapy but wants to check her insurance before scheduling follow-up.   Clinical Decision  Making Low   Rehab Potential Good   PT Frequency 2x / week  However, she wants to check insurance before scheduling appointments.   PT Duration 4 weeks   PT Treatment/Interventions ADLs/Self Care Home Management;DME Instruction;Therapeutic exercise;Patient/family education;Manual techniques;Scar mobilization;Passive range of motion;Taping    PT Next Visit Plan Pt. has not scheduled; she wants to doublecheck her insurance coverage before doing that. She is to call to let me know (or let me know at [facility] class next Monday.) She does plan to come to free ABC class for education. If she schedules appointments, begin P/AA/A/ROM to both shoulders and HEP for same. Progress as able.   Recommended Other Services ABC class   Consulted and Agree with Plan of Care Patient      Patient will benefit from skilled therapeutic intervention in order to improve the following deficits and impairments:  Decreased range of motion, Impaired UE functional use, Decreased knowledge of precautions, Decreased knowledge of use of DME  Visit Diagnosis: Stiffness of right shoulder, not elsewhere classified - Plan: PT plan of care cert/re-cert  Acute pain of right shoulder - Plan: PT plan of care cert/re-cert  Stiffness of left shoulder, not elsewhere classified - Plan: PT plan of care cert/re-cert  Acute pain of left shoulder - Plan: PT plan of care cert/re-cert  Aftercare following surgery for neoplasm - Plan: PT plan of care cert/re-cert     Problem List Patient Active Problem List   Diagnosis Date Noted  . Breast cancer, right ([place]) 06/27/2017  . Genetic testing 04/24/2017  . Monoallelic mutation of CHEK2 gene in female patient 04/24/2017  . Malignant neoplasm of upper-outer quadrant of right breast in female, estrogen receptor positive ([place]) 02/02/2017  . VAGINITIS, BACTERIAL 01/22/2010  . PELVIC PAIN, RIGHT 01/22/2010    SALISBURY,DONNA 07/19/2017, 3:54 PM  [place], [place], [address] Phone: [phone]   Fax:  [phone]  Name: Olivia Frederick MRN: [id number] Date of Birth: 1971/06/18  Serafina Royals, PT 07/19/17 3:54 PM  PHYSICAL THERAPY DISCHARGE SUMMARY  Visits from Start of Care: 1  Current functional level related to goals / functional  outcomes: Unknown.  The plan was for follow-up therapy 2x/week x 4 weeks, but the patient wanted to check on her insurance coverage first before coming in.  She did not return for any follow-up treatments.   Remaining deficits: Unknown.   Education / Equipment: None; it was suggested that she attend the free After Breast Cancer class which includes instruction in stretching as well as education about lymphedema, but I do not know whether she attended. Plan: Patient agrees to discharge.  Patient goals were not met. Patient is being discharged due to not returning since the last visit.  ?????    Serafina Royals, PT 02/15/18 2:19 PM

## 2017-07-21 ENCOUNTER — Other Ambulatory Visit: Payer: Self-pay | Admitting: *Deleted

## 2017-07-21 DIAGNOSIS — Z170 Estrogen receptor positive status [ER+]: Principal | ICD-10-CM

## 2017-07-21 DIAGNOSIS — C50411 Malignant neoplasm of upper-outer quadrant of right female breast: Secondary | ICD-10-CM

## 2017-07-25 ENCOUNTER — Telehealth: Payer: Self-pay | Admitting: *Deleted

## 2017-07-25 ENCOUNTER — Telehealth: Payer: Self-pay | Admitting: Oncology

## 2017-07-25 NOTE — Telephone Encounter (Signed)
  Oncology Nurse Navigator Documentation  Navigator Location: [facility]- (07/25/17 1400)   )Navigator Encounter Type: Telephone (07/25/17 1400) Telephone: [place] Call (07/25/17 1400)                                      Specialty Items/DME: Wigs;Sleeves (07/25/17 1400)  Faxed to [facility]         Time Spent with Patient: 15 (07/25/17 1400)

## 2017-07-25 NOTE — Telephone Encounter (Signed)
Spoke with patient regarding the addition of her appts per 9/15 sch msg. Sending her a confirmation letter in the mail.

## 2017-07-26 ENCOUNTER — Ambulatory Visit: Payer: 59

## 2017-07-26 ENCOUNTER — Ambulatory Visit: Payer: 59 | Admitting: Radiation Oncology

## 2017-07-28 ENCOUNTER — Encounter: Payer: Self-pay | Admitting: Radiation Oncology

## 2017-07-28 NOTE — Progress Notes (Signed)
Location of Breast Cancer: upper-outer quadrant of right breast    Histology per Pathology Report:   06/27/17 Diagnosis 1. Breast, simple mastectomy, Left BENIGN TERMINAL LOBULAR GLANDS AND STROMA NEGATIVE FOR MALIGNANCY 2. Breast, simple mastectomy, Right THREE FOCI OF INVASIVE DUCTAL CARCINOMA (POST NEOADJUVANT), 3.2 CM, 1.6 CM, AND 0.7 CM DUCTAL CARCINOMA IN SITU IS PRESENT LYMPHOVASCULAR INVASION IDENTIFIED ONE BENIGN LYMPH NODE (0/1) 3. Lymph node, sentinel, biopsy, Right ONE BENIGN LYMPH NODE (0/1) 4. Soft tissue, biopsy, Right Axillary ONE BENIGN LYMPH NODE (0/1) 5. Lymph node, sentinel, biopsy, Right ONE BENIGN LYMPH NODE (0/1) 6. Lymph node, sentinel, biopsy, Right TWO BENIGN LYMPH NODES (0/2) 7. Lymph node, sentinel, biopsy, Right ONE BENIGN LYMPH NODE (0/1) 8. Nipple Biopsy, Right base BENIGN BREAST TISSUE  Receptor Status: ER (95%), PR (10%)  02/17/17 Diagnosis Breast, right, needle core biopsy, 8:30 o'clock; 5 to 6 CMFN - INVASIVE DUCTAL CARCINOMA, 0.7 CM IN GREATEST LINEAR EXTENT - DUCTAL CARCINOMA IN SITU, HIGH NUCLEAR, SOLID AND CRIBRIFORM PATTERNS - SEE COMMENT Microscopic Comment The findings are consistent with grade 3. Prognostic markers have been ordered and will be reported in an addendum.  Receptor Status: ER(100%), PR (50%, Her2-neu (negative), Ki-(50)  01/31/17 Diagnosis 1. Breast, right, needle core biopsy, 12:00 o'clock - INVASIVE DUCTAL CARCINOMA, SEE COMMENT. - DUCTAL CARCINOMA IN SITU. 2. Lymph node, needle/core biopsy, axillary node - REACTIVE LYMPH NODE. - NO MALIGNANCY IDENTIFIED.  Receptor Status: ER(100%), PR(25%), HER2 (positive)  Did patient present with symptoms (if so, please note symptoms) or was this found on screening mammography?: patient noted a change in her right breast around November 2017  Past/Anticipated interventions by surgeon, if any: 06/27/17 - Procedure: BILATERAL NIPPLE SPARING MASTECTOMY WITH RIGHT SENTINAL  LYMPH NODE BIOPSY, Dr. Hoxsworth and Procedure: BILATERAL BREAST RECONSTRUCTION WITH PLACEMENT OF TISSUE EXPANDER AND ALLODERM;  Surgeon: Thimmappa, Brinda, MD;  Location: [facility]  Past/Anticipated interventions by medical oncology, if any: neoadjuvant chemotherapy will consist of carboplatin, docetaxel, and trastuzumab with pertuzumab given every 3 weeks 6, starting 02/16/2017, completed 06/01/2017.  Will start tamoxifen on 08/21/17  Lymphedema issues, if any:  no    Pain issues, if any:  no but does feel stiff from starting physical therapy last Monday.  She has some difficulty raising her arms.  SAFETY ISSUES:  Prior radiation? no  Pacemaker/ICD? no  Possible current pregnancy?no  Is the patient on methotrexate? no  Current Complaints / other details:    BP 112/71 (BP Location: Left Arm, Patient Position: Sitting)   Pulse 91   Temp 98.2 F (36.8 C) (Oral)   Ht 5' 6" (1.676 m)   Wt 143 lb 3.2 oz (65 kg)   SpO2 100%   BMI 23.11 kg/m    Wt Readings from Last 3 Encounters:  07/31/17 143 lb 3.2 oz (65 kg)  07/13/17 147 lb 6.4 oz (66.9 kg)  06/27/17 150 lb 2 oz (68.1 kg)      Hess, Karen R, RN 07/28/2017,9:14 AM   

## 2017-07-31 ENCOUNTER — Telehealth: Payer: Self-pay | Admitting: Oncology

## 2017-07-31 ENCOUNTER — Ambulatory Visit
Admission: RE | Admit: 2017-07-31 | Discharge: 2017-07-31 | Disposition: A | Discharge status: Home / Self Care | Payer: 59 | Source: Ambulatory Visit | Attending: Radiation Oncology | Admitting: Radiation Oncology

## 2017-07-31 ENCOUNTER — Encounter: Payer: Self-pay | Admitting: Radiation Oncology

## 2017-07-31 VITALS — BP 112/71 | HR 91 | Temp 98.2°F | Ht 66.0 in | Wt 143.2 lb

## 2017-07-31 DIAGNOSIS — C50511 Malignant neoplasm of lower-outer quadrant of right female breast: Secondary | ICD-10-CM | POA: Diagnosis not present

## 2017-07-31 DIAGNOSIS — Z1501 Genetic susceptibility to malignant neoplasm of breast: Secondary | ICD-10-CM | POA: Insufficient documentation

## 2017-07-31 DIAGNOSIS — Z1589 Genetic susceptibility to other disease: Principal | ICD-10-CM

## 2017-07-31 DIAGNOSIS — Z1502 Genetic susceptibility to malignant neoplasm of ovary: Secondary | ICD-10-CM | POA: Diagnosis not present

## 2017-07-31 DIAGNOSIS — Z1509 Genetic susceptibility to other malignant neoplasm: Secondary | ICD-10-CM | POA: Diagnosis not present

## 2017-07-31 DIAGNOSIS — Z9013 Acquired absence of bilateral breasts and nipples: Secondary | ICD-10-CM | POA: Insufficient documentation

## 2017-07-31 DIAGNOSIS — Z170 Estrogen receptor positive status [ER+]: Secondary | ICD-10-CM | POA: Diagnosis not present

## 2017-07-31 DIAGNOSIS — C50411 Malignant neoplasm of upper-outer quadrant of right female breast: Secondary | ICD-10-CM | POA: Insufficient documentation

## 2017-07-31 NOTE — Telephone Encounter (Signed)
Left a message for patient regarding her appointment today.  Requested a return call.

## 2017-07-31 NOTE — Telephone Encounter (Signed)
Discussed patient's questions with Dr. Sondra Come and called patient back.  Advised her that the 40% recurrance rate was correct, that Dr. Sondra Come would like to treat the subclavian area to reduce risk of recurrence due to the lymphovascular invasion and that Dr. Sondra Come is recommending 28 fractions, which is the standard of care.  She does not qualify for hypofractionation due to having implants/expanders and because of her age.  She verbalized understanding and agreement.

## 2017-07-31 NOTE — Telephone Encounter (Signed)
Patient called and had a few questions about radiation.  She was wondering why her chance of recurrence is 40% when she has had a mastectomy and chemotherapy.  She is also wondering why her lymph nodes will be treated when they were negative.  She is not sure what lymphovascular invasion means.  She also said Dr. Sondra Come mentioned that her dosage of radiation would not be as strong and is wondering why.

## 2017-07-31 NOTE — Progress Notes (Signed)
Radiation Oncology         [phone] ________________________________  Name: ELYSSA PENDELTON MRN: [id number]  Date: 07/31/2017  DOB: [birth year]  Re-evaluation Visit Note  CC: Ann Held, DO  Magrinat, Virgie Dad, MD    ICD-10-CM   1. Monoallelic mutation of CHEK2 gene in female patient Z15.01    Z15.89    Z15.09    Z15.02   2. 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  Narrative:  The patient returns today for routine follow-up. Of note, since the patient was seen in breast clinic on 02/08/2017, she had MR breast bilateral completed on 02/13/2017 with results revealing: IMPRESSION: Three adjacent irregular masses within the upper/ central right breast encompassing an area measuring 5.1 x 1.8 x 3.1 cm. The upper right breast mass (anterior third) has biopsy clip artifact and represents biopsy-proven malignancy. Suspicious 1.2 cm mass within the lower outer right breast. She then underwent a right needle core biopsy on 02/17/2017 with results revealing: Diagnosis Breast, right, needle core biopsy, 8:30 o'clock; 5 to 6 CMFN. Invasive ductal carcinoma, 0.7 cm in greatest linear extent. Ductal carcinoma in situ, high nuclear, solid and cribriform patterns. Microscopic Comment The findings are consistent with grade 3. Pt also had genetic testing completed on 03/08/2017 with results of: positive results of pathogenic variant identified in [place]. She has had a bilateral nipple sparing mastectomies completed on 06/27/2017 with results revealing 1. Breast, simple mastectomy, Left, benign terminal lobular glands and stroma. Negative for malignancy. 2. Breast, simple mastectomy, Right, three foci of invasive ductal carcinoma (post neoadjuvant), 3.2 cm, 1.6 cm, and 0.7 cm. Ductal carcinoma in situ is present. lymphovascular  invasion identified. All margins of resection are negative for carcinoma. Of the 7 lymph nodes biopsied, all 7 were negative for malignancy. 3. Nipple biopsy, right base with benign breast tissue. Pt also had a MR breast bilateral completed on 05/23/2017 with results revealing: IMPRESSION: 1. The 4 previously demonstrated enhancing masses within the right breast all demonstrate markedly decreased intensity of the contrast enhancement indicating good response to interval therapy. Only minimal contrast enhancement persists at each site, now only seen on delayed images with sub threshold enhancement kinetics, with measurements given above. 2. No evidence of malignancy within the left breast. 3. No evidence of metastatic lymphadenopathy.            INTERVAL HISTORY:  Pt presents to the office today for follow up. She reports that she is doing well overall. She notes that she has chest tightness following the surgery. She states that she only takes her Rx narcotic medications PRN and reports that she took half an oxycodone last night. Pt states that she has completed chemotherapy. Pt reports that has been completing physical therapy.   On review of systems, pt denies right arm swelling, numbness, tingling. Limited arm mobility at this point                    ALLERGIES:  has No Known Allergies.  Meds: Current Outpatient Prescriptions  Medication Sig Dispense Refill  . diphenhydrAMINE (BENADRYL) 25 mg capsule Take 25 mg by mouth as needed.    Marland Kitchen HYDROcodone-acetaminophen (NORCO/VICODIN) 5-325 MG tablet Take 1 tablet by mouth at bedtime as needed for moderate pain.    Marland Kitchen ibuprofen (ADVIL,MOTRIN) 200  MG tablet Take 200 mg by mouth.    . tamoxifen (NOLVADEX) 20 MG tablet Take 1 tablet (20 mg total) by mouth daily. (Patient not taking: Reported on 07/19/2017) 90 tablet 12   No current facility-administered medications for this encounter.     Physical Findings: The patient is in no acute distress. Patient is  alert and oriented.  height is '5\' 6"'  (1.676 m) and weight is 143 lb 3.2 oz (65 kg). Her oral temperature is 98.2 F (36.8 C). Her blood pressure is 112/71 and her pulse is 91. Her oxygen saturation is 100%. .  No significant changes. Lungs are clear to auscultation bilaterally. Heart has regular rate and rhythm. No palpable cervical, supraclavicular, or axillary adenopathy. Abdomen soft, non-tender, normal bowel sounds. Left breast with tissue expander in place with nipple remaining. Healing well without signs of drainage or infection. Right chest with tissue expander in place with nipple remaining. Healing well without signs of drainage or infection. Pt does have significant limited arm mobility at this time.    Lab Findings: Lab Results  Component Value Date   WBC 4.9 07/13/2017   HGB 9.3 (L) 07/13/2017   HCT 27.2 (L) 07/13/2017   MCV 80.5 07/13/2017   PLT 278 07/13/2017    Radiographic Findings: No results found.  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 Pt found to have 3 separate tumors with the largest measuring up to 3.2 cm. Pt surgical margins are clear. The 7 lymph nodes recovered from right axilla showed no evidence of metastatic disease. Pt has 3 risk factors predicting for chest wall recurrence, that being T2 tumor, pre-menopausal status, and LVI. retrospective review out of [facility] showed patients with 3 risk factors had up to a 40.6% locoregional recurrence. The NCCN guidelines also recommend consideration for post-mastectomy radiation in this situation. I discussed the general course of radiation, potential side effects, and toxicities with radiation with tthe patient. She is uncertain at this point if she would proceed with treatment, but she will let us know in the next couple of days. I did discuss with the patient that if she would proceed with post-mastectomy radiation, this will likely delay  reconstructive surgery by Dr.  Iran Planas.    Plan:  Post-mastectomy radiation therapy plans are pending patient's decision concerning this adjuvant treatment.   ____________________________________ -----------------------------------  Blair Promise, PhD, MD  This document serves as a record of services personally performed by Gery Pray, MD. It was created on her behalf by Steva Colder, a trained medical scribe. The creation of this record is based on the scribe's personal observations and the provider's statements to them. This document has been checked and approved by the attending provider.

## 2017-07-31 NOTE — Progress Notes (Signed)
Please see the Nurse Progress Note in the MD Initial Consult Encounter for this patient. 

## 2017-08-03 ENCOUNTER — Other Ambulatory Visit (HOSPITAL_BASED_OUTPATIENT_CLINIC_OR_DEPARTMENT_OTHER): Payer: 59

## 2017-08-03 ENCOUNTER — Other Ambulatory Visit: Payer: Self-pay | Admitting: Hematology and Oncology

## 2017-08-03 ENCOUNTER — Ambulatory Visit (HOSPITAL_BASED_OUTPATIENT_CLINIC_OR_DEPARTMENT_OTHER): Payer: 59

## 2017-08-03 VITALS — BP 105/75 | HR 86 | Temp 98.6°F | Resp 18

## 2017-08-03 DIAGNOSIS — C50511 Malignant neoplasm of lower-outer quadrant of right female breast: Secondary | ICD-10-CM

## 2017-08-03 DIAGNOSIS — Z5112 Encounter for antineoplastic immunotherapy: Secondary | ICD-10-CM | POA: Diagnosis not present

## 2017-08-03 DIAGNOSIS — Z170 Estrogen receptor positive status [ER+]: Principal | ICD-10-CM

## 2017-08-03 DIAGNOSIS — C50411 Malignant neoplasm of upper-outer quadrant of right female breast: Secondary | ICD-10-CM

## 2017-08-03 LAB — COMPREHENSIVE METABOLIC PANEL
ALBUMIN: 3.6 g/dL (ref 3.5–5.0)
ALK PHOS: 55 U/L (ref 40–150)
ALT: <6 U/L (ref 0–55)
ANION GAP: 8 meq/L (ref 3–11)
AST: 13 U/L (ref 5–34)
BUN: 4.9 mg/dL — ABNORMAL LOW (ref 7.0–26.0)
CALCIUM: 10.1 mg/dL (ref 8.4–10.4)
CHLORIDE: 104 meq/L (ref 98–109)
CO2: 29 mEq/L (ref 22–29)
Creatinine: 0.8 mg/dL (ref 0.6–1.1)
Glucose: 84 mg/dl (ref 70–140)
POTASSIUM: 4.5 meq/L (ref 3.5–5.1)
Sodium: 141 mEq/L (ref 136–145)
Total Bilirubin: 0.29 mg/dL (ref 0.20–1.20)
Total Protein: 7.8 g/dL (ref 6.4–8.3)

## 2017-08-03 LAB — CBC WITH DIFFERENTIAL/PLATELET
BASO%: 0.6 % (ref 0.0–2.0)
Basophils Absolute: 0 10*3/uL (ref 0.0–0.1)
EOS%: 2.8 % (ref 0.0–7.0)
Eosinophils Absolute: 0.1 10*3/uL (ref 0.0–0.5)
HEMATOCRIT: 29.2 % — AB (ref 34.8–46.6)
HEMOGLOBIN: 10 g/dL — AB (ref 11.6–15.9)
LYMPH#: 1.3 10*3/uL (ref 0.9–3.3)
LYMPH%: 26.6 % (ref 14.0–49.7)
MCH: 26.7 pg (ref 25.1–34.0)
MCHC: 34.4 g/dL (ref 31.5–36.0)
MCV: 77.6 fL — AB (ref 79.5–101.0)
MONO#: 0.7 10*3/uL (ref 0.1–0.9)
MONO%: 14 % (ref 0.0–14.0)
NEUT#: 2.8 10*3/uL (ref 1.5–6.5)
NEUT%: 56 % (ref 38.4–76.8)
PLATELETS: 319 10*3/uL (ref 145–400)
RBC: 3.76 10*6/uL (ref 3.70–5.45)
RDW: 16.8 % — AB (ref 11.2–14.5)
WBC: 5 10*3/uL (ref 3.9–10.3)

## 2017-08-03 MED ORDER — DIPHENHYDRAMINE HCL 25 MG PO CAPS
ORAL_CAPSULE | ORAL | Status: AC
  Filled 2017-08-03: qty 1

## 2017-08-03 MED ORDER — SODIUM CHLORIDE 0.9% FLUSH
10.0000 mL | INTRAVENOUS | Status: DC | PRN
  Administered 2017-08-03: 10 mL
  Filled 2017-08-03: qty 10

## 2017-08-03 MED ORDER — TRASTUZUMAB CHEMO 150 MG IV SOLR
6.0000 mg/kg | Freq: Once | INTRAVENOUS | Status: AC
  Administered 2017-08-03: 420 mg via INTRAVENOUS
  Filled 2017-08-03: qty 20

## 2017-08-03 MED ORDER — DIPHENHYDRAMINE HCL 25 MG PO CAPS
25.0000 mg | ORAL_CAPSULE | Freq: Once | ORAL | Status: AC
  Administered 2017-08-03: 25 mg via ORAL

## 2017-08-03 MED ORDER — ACETAMINOPHEN 325 MG PO TABS
ORAL_TABLET | ORAL | Status: AC
  Filled 2017-08-03: qty 1

## 2017-08-03 MED ORDER — SODIUM CHLORIDE 0.9 % IV SOLN
Freq: Once | INTRAVENOUS | Status: AC
  Administered 2017-08-03: 15:00:00 via INTRAVENOUS

## 2017-08-03 MED ORDER — HEPARIN SOD (PORK) LOCK FLUSH 100 UNIT/ML IV SOLN
500.0000 [IU] | Freq: Once | INTRAVENOUS | Status: AC | PRN
  Administered 2017-08-03: 500 [IU]
  Filled 2017-08-03: qty 5

## 2017-08-03 MED ORDER — ACETAMINOPHEN 325 MG PO TABS
650.0000 mg | ORAL_TABLET | Freq: Once | ORAL | Status: AC
  Administered 2017-08-03: 650 mg via ORAL

## 2017-08-03 NOTE — Patient Instructions (Signed)
[facility] Discharge Instructions for Patients Receiving Chemotherapy  Today you received the following chemotherapy agents:  Herceptin  To help prevent nausea and vomiting after your treatment, we encourage you to take your nausea medication as prescribed.   If you develop nausea and vomiting that is not controlled by your nausea medication, call the clinic.   BELOW ARE SYMPTOMS THAT SHOULD BE REPORTED IMMEDIATELY:  *FEVER GREATER THAN 100.5 F  *CHILLS WITH OR WITHOUT FEVER  NAUSEA AND VOMITING THAT IS NOT CONTROLLED WITH YOUR NAUSEA MEDICATION  *UNUSUAL SHORTNESS OF BREATH  *UNUSUAL BRUISING OR BLEEDING  TENDERNESS IN MOUTH AND THROAT WITH OR WITHOUT PRESENCE OF ULCERS  *URINARY PROBLEMS  *BOWEL PROBLEMS  UNUSUAL RASH Items with * indicate a potential emergency and should be followed up as soon as possible.  Feel free to call the clinic you have any questions or concerns. The clinic phone number is [phone].  Please show the CHEMO ALERT CARD at check-in to the Emergency Department and triage nurse.   

## 2017-08-09 LAB — HM COLONOSCOPY

## 2017-08-10 ENCOUNTER — Telehealth: Payer: Self-pay | Admitting: *Deleted

## 2017-08-10 NOTE — Telephone Encounter (Signed)
Pt request appt to discuss post mastectomy xrt with dr. Jana Hakim and Mendel Ryder NP. Scheduled and confirmed appt on 10/9 at 8:30am

## 2017-08-15 ENCOUNTER — Encounter: Payer: Self-pay | Admitting: Adult Health

## 2017-08-15 ENCOUNTER — Telehealth: Payer: Self-pay | Admitting: Adult Health

## 2017-08-15 ENCOUNTER — Ambulatory Visit (HOSPITAL_BASED_OUTPATIENT_CLINIC_OR_DEPARTMENT_OTHER): Payer: 59 | Admitting: Adult Health

## 2017-08-15 VITALS — BP 113/65 | HR 78 | Temp 98.2°F | Resp 18 | Ht 66.0 in | Wt 145.3 lb

## 2017-08-15 DIAGNOSIS — Z170 Estrogen receptor positive status [ER+]: Secondary | ICD-10-CM

## 2017-08-15 DIAGNOSIS — C50511 Malignant neoplasm of lower-outer quadrant of right female breast: Secondary | ICD-10-CM | POA: Diagnosis not present

## 2017-08-15 DIAGNOSIS — C50411 Malignant neoplasm of upper-outer quadrant of right female breast: Secondary | ICD-10-CM

## 2017-08-15 NOTE — Telephone Encounter (Signed)
Scheduled appt per 10/9 los. Changed from est to [facility]

## 2017-08-15 NOTE — Progress Notes (Signed)
[place]  [phone] [phone]) [phone]     ID: Olivia Frederick DOB: [birth year]  MR#: [id number]  QZR#:[id number]  Patient Care Team: Carollee Herter, Alferd Apa, DO as PCP - General (Family Medicine) Excell Seltzer, MD as Consulting Physician (General Surgery) Magrinat, Virgie Dad, MD as Consulting Physician (Oncology) Gery Pray, MD as Consulting Physician (Radiation Oncology) Juanita Craver, MD as Consulting Physician (Gastroenterology) Scot Dock, NP OTHER MD:  CHIEF COMPLAINT: Triple positive breast cancer  CURRENT TREATMENT: Finished surgery   BREAST CANCER HISTORY: From the original intake note:  Olivia Frederick noted a change in her right breast around November 2017. She was not very concerned about it but did mention it at the time of mammography which had been scheduled for 01/30/2017. Accordingly she had bilateral diagnostic mammography with tomography and right breast ultrasonography that day at the [facility]. In the right breast there was an irregular mass measuring approximately 5 cm. On exam this was firm and palpable at the 12:00 location 2 cm from the nipple targeted ultrasonography showed an area of mixed echogenicity superiorly measuring at least 4.6 cm with a similar appearing adjacent area measuring 1.3 cm thought to be contiguous ultrasound of the right axilla showed one ymph node with cortical thickening.  Biopsy of the right breast mass 01/31/2017 showed ([id number]) invasive ductal carcinoma, grade 2, estrogen receptor 100% positive, with strong staining intensity, progesterone receptor 25% positive, with moderate staining intensity, with an MIB-1 of 25%, and HER-2 amplified with a signals ratio of 2.35, the number per cell being 4.0. Biopsy of the suspicious axillary lymph node showed only reactive  Her subsequent history is as detailed below  INTERVAL HISTORY: Olivia Frederick is doing well today.  She did just start taking Tamoxifen  and is doing moderately well today.  She is confused about a recommendation to get radiation from Dr. Sondra Come.  She wants to talk to Dr. Jana Hakim about this.    REVIEW OF SYSTEMS: She is experiencing some tightness underneath her right arm.  She continues to do exercises daily.  She has been to PT, and she was shown exercises and this is what she is doing.  No swelling or pain in the arm.  She denies any other issues today and a detailed ROS is non contributory.    PAST MEDICAL HISTORY: Past Medical History:  Diagnosis Date  . Breast cancer ([place])   . Cervical dysplasia   . Genetic testing 04/24/2017   [name] underwent genetic counseling and testing for hereditary cancer syndromes on 03/08/2017. 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   The remaining 45 genes analyzed by [facility]'s 46-gene Common Hereditary Cancers Panel were negative for mutations. Genes analyzed include: APC, ATM, AXIN2, BARD1, BMPR1A, BRCA1, BRCA2, BRIP1, CDH1, CDKN2    PAST SURGICAL HISTORY: Past Surgical History:  Procedure Laterality Date  . BREAST EXCISIONAL BIOPSY Right   . BREAST RECONSTRUCTION WITH PLACEMENT OF TISSUE EXPANDER AND FLEX HD (ACELLULAR HYDRATED DERMIS) Bilateral 06/27/2017   Procedure: BILATERAL BREAST RECONSTRUCTION WITH PLACEMENT OF TISSUE EXPANDER AND ALLODERM;  Surgeon: Irene Limbo, MD;  Location: [place];  Service: Plastics;  Laterality: Bilateral;  . BREAST SURGERY     right breast benign tumor   . CESAREAN SECTION     x two  . NIPPLE SPARING MASTECTOMY/SENTINAL LYMPH NODE BIOPSY/RECONSTRUCTION/PLACEMENT OF TISSUE EXPANDER Bilateral 06/27/2017   Procedure: BILATERAL NIPPLE SPARING MASTECTOMY WITH RIGHT SENTINAL LYMPH NODE BIOPSY;  Surgeon: Excell Seltzer,  MD;  Location: [place];  Service: General;  Laterality: Bilateral;    FAMILY HISTORY Family History  Problem Relation Age of Onset  .  Hypertension Mother   . Diabetes Father   . Leukemia Paternal Grandfather        d.64  . Breast cancer Cousin 40       Bilateral breast cancer. Daughter of paternal uncle with leukemia.  . Leukemia Paternal Uncle 48  . Lung cancer Maternal Uncle 74  . Brain cancer Maternal Aunt 65       d.66  . Cancer Maternal Uncle        unspecified type  . Brain cancer Cousin 61       d.55 daughter of maternal aunt with brain cancer  The patient's parents are living, both in their early 68s as of April 2018. She has one brother and one sister. On the paternal side a grandfather was diagnosed with leukemia at age 102, an uncle also with leukemia at age 35, another uncle at age 26 with colon cancer, and a cousin with breast cancer at age 1. On the mother's side there was an uncle with cancer of some type, not known to the patient, diagnosed age 44   GYNECOLOGIC HISTORY:  No LMP recorded. Patient is not currently having periods (Reason: Chemotherapy).  Menarche age 71. She is GX P2, first live birth age 16. As of April 2018 she is having regular periods. She never used oral contraceptives.    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 Son [name] is 63, daughter cavity is 19, as of April 2018.     ADVANCED DIRECTIVES: Not in place    HEALTH MAINTENANCE: Social History  Substance Use Topics  . Smoking status: Former Smoker    Quit date: 02/01/2017  . Smokeless tobacco: Never Used  . Alcohol use 3.6 oz/week    6 Glasses of wine per week     Comment: socially     Colonoscopy: n/a  PAP:  Bone density:   No Known Allergies  Current Outpatient Prescriptions  Medication Sig Dispense Refill  . diphenhydrAMINE (BENADRYL) 25 mg capsule Take 25 mg by mouth as needed.    Marland Kitchen HYDROcodone-acetaminophen (NORCO/VICODIN) 5-325 MG tablet Take 1 tablet by mouth at bedtime as needed for moderate pain.    Marland Kitchen ibuprofen (ADVIL,MOTRIN) 200 MG tablet Take 200 mg by mouth.     No  current facility-administered medications for this visit.     OBJECTIVE:  Vitals:   08/15/17 0838  BP: 113/65  Pulse: 78  Resp: 18  Temp: 98.2 F (36.8 C)  SpO2: 100%     Body mass index is 23.45 kg/m.    Filed Weights   08/15/17 0838  Weight: 145 lb 4.8 oz (65.9 kg)     ECOG FS:1 - Symptomatic but completely ambulatory  GENERAL: Patient is a well appearing female in no acute distress HEENT:  Sclerae anicteric.  Oropharynx clear and moist. No ulcerations or evidence of oropharyngeal candidiasis. Neck is supple.  NODES:  No cervical, supraclavicular, or axillary lymphadenopathy palpated.  BREAST EXAM:  Deferred. LUNGS:  Clear to auscultation bilaterally.  No wheezes or rhonchi. HEART:  Regular rate and rhythm. No murmur appreciated. ABDOMEN:  Soft, nontender.  Positive, normoactive bowel sounds. No organomegaly palpated. MSK:  No focal spinal tenderness to palpation. Full range of motion bilaterally in the upper extremities. EXTREMITIES:  No peripheral edema.   SKIN:  Clear with no obvious rashes or skin changes. No nail dyscrasia. NEURO:  Nonfocal. Well oriented.  Appropriate affect.    LAB RESULTS:  CMP     Component Value Date/Time   NA 141 08/03/2017 1040   K 4.5 08/03/2017 1040   CO2 29 08/03/2017 1040   GLUCOSE 84 08/03/2017 1040   BUN 4.9 (L) 08/03/2017 1040   CREATININE 0.8 08/03/2017 1040   CALCIUM 10.1 08/03/2017 1040   PROT 7.8 08/03/2017 1040   ALBUMIN 3.6 08/03/2017 1040   AST 13 08/03/2017 1040   ALT <6 08/03/2017 1040   ALKPHOS 55 08/03/2017 1040   BILITOT 0.29 08/03/2017 1040    No results found for: TOTALPROTELP, ALBUMINELP, A1GS, A2GS, BETS, BETA2SER, GAMS, MSPIKE, SPEI  No results found for: KPAFRELGTCHN, LAMBDASER, KAPLAMBRATIO  Lab Results  Component Value Date   WBC 5.0 08/03/2017   NEUTROABS 2.8 08/03/2017   HGB 10.0 (L) 08/03/2017   HCT 29.2 (L) 08/03/2017   MCV 77.6 (L) 08/03/2017   PLT 319 08/03/2017      Chemistry        Component Value Date/Time   NA 141 08/03/2017 1040   K 4.5 08/03/2017 1040   CO2 29 08/03/2017 1040   BUN 4.9 (L) 08/03/2017 1040   CREATININE 0.8 08/03/2017 1040      Component Value Date/Time   CALCIUM 10.1 08/03/2017 1040   ALKPHOS 55 08/03/2017 1040   AST 13 08/03/2017 1040   ALT <6 08/03/2017 1040   BILITOT 0.29 08/03/2017 1040       No results found for: LABCA2  No components found for: CVELFY101  No results for input(s): INR in the last 168 hours.  Urinalysis    Component Value Date/Time   COLORURINE yellow 01/22/2010 1457   APPEARANCEUR Clear 01/22/2010 1457   LABSPEC 1.015 01/22/2010 1457   PHURINE 5.0 01/22/2010 1457   GLUCOSEU NEGATIVE 05/24/2007 1404   HGBUR trace-lysed 01/22/2010 1457   BILIRUBINUR negative 01/22/2010 [place] 05/24/2007 1404   PROTEINUR NEGATIVE 05/24/2007 1404   UROBILINOGEN 0.2 01/22/2010 1457   NITRITE negative 01/22/2010 1457   LEUKOCYTESUR  05/24/2007 1404    NEGATIVE MICROSCOPIC NOT DONE ON URINES WITH NEGATIVE PROTEIN, BLOOD, LEUKOCYTES, NITRITE, OR GLUCOSE <1000 mg/dL.     STUDIES: No results found.   ELIGIBLE FOR AVAILABLE RESEARCH PROTOCOL: No  ASSESSMENT: 46 y.o. CHEK2 positive [place] woman status post right breast biopsy 02/01/2017 for a cT2 pN0, stage IB invasive ductal carcinoma, grade 2, estrogen and progesterone receptor positive, with an MIB-1 of 25%, and HER-2 amplified   (a) biopsy of a suspicious right axillary lymph node 01/24/2017 showed only reactive lymphoid tissue  (b) biopsy of right breast lower outer quadrant 02/17/2017 showed invasive ductal carcinoma, grade 3, estrogen and progesterone receptor positive, HER-2 not amplified, with an Mib-1 of 50%  (1) genetics testing 46-gene Common Hereditary Cancer Panel offered by [facility] identified a single, heterozygous pathogenic gene mutation called CHEK2, c.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 There were no additional mutations in the remaining 45  genes analyzed: APC, ATM, AXIN2, BARD1, BMPR1A, BRCA1, BRCA2, BRIP1, CDH1, CDKN2A, CTNNA1, DICER1, EPCAM, GREM1, HOXB13, KIT, MEN1, MLH1, MSH2, MSH3, MSH6, MUTYH, NBN, NF1, NTHL1, PALB2, PDGFRA, PMS2, POLD1, POLE, PTEN, RAD50, RAD51C, RAD51D, SDHA, SDHB, SDHC, SDHD, SMAD4, SMARCA4, STK11, TP53, TSC1, TSC2, and VHL.  (a) will need either bilateral mastectomies or intensified screening  (b) colon cancer screening to begin at age 60 and repeated every 5 years (10% lifetime risk).  (2) neoadjuvant  chemotherapy will consist of carboplatin, docetaxel, and trastuzumab with pertuzumab given every 3 weeks 6, starting 02/16/2017, completed 06/01/2017  (3) anti-HER-2 immunotherapy to continue to total 1 year  (a) baseline echocardiogram 02/13/2017 found an ejection fraction of 55-60%  (b) echocardiogram 05/23/2017 found an ejection fraction of 55-60%.  (4) status post bilateral nipple sparing mastectomies with right sentinel lymph node sampling 06/27/2017, showing  (a) on the left, no evidence of malignancy  (b) on the right, residual mpT2 pN0 invasive ductal carcinoma, grade 2, with ample margins  (5) adjuvant radiation as appropriate  (6) anti-estrogens to follow the completion of local treatment   PLAN: Olivia Frederick is doing well today.  I reviewed the article I received from Dr. Sondra Come with Dr. Jana Hakim.  We also reviewed her pathology.  See his addendum below.  She will continue taking Tamoxifen, and will continue with Herceptin.  She will start breast MRI as Dr. Jana Hakim has detailed.  I will see her back in November, and we will change that appt to a SCP appointment that will detail our recommendations.      Scot Dock, NP   08/15/2017 8:44 AM Medical Oncology and Hematology [facility] [address] [place], [place] [address] Tel. [phone]    Fax. [phone]   ADDENDUM: Olivia Frederick has done very well with her surgery and is doing well with her reconstruction as  well.  She is continuing her anti-HER-2 treatment to complete one year, and of course she is also on anti-estrogens, which will continue for a total of 10 years.  We discussed the issue of adjuvant radiation in detail. The study provided helpfully by Dr. Sondra Come suggests a risk of local recurrence up to 40% in young women who have tumors slightly larger than 2 cm who have LV. This is a very high figure. I don't think this is corroborated by multiple other studies, and in addition he doesn't take into account the fact that we are dealing with a triple positive patients.  Since anti-HER-2 therapy and anti-estrogen therapy each reduces the risk of recurrence by one half, I quoted Olivia Frederick a risk of local recurrence in the 12% range. Since radiation generally reduces this by about two thirds I think she could end up with a less than 5% risk of local recurrence if she had radiation.  Given those numbers she strongly prefers not to proceed to radiation.  Because of the risk of recurrence however once she has completed all her treatment and things have "settled down" in her reconstructed breast we will obtain a breast MRI and we will repeat that yearly at least 2 more times so that if recurrence does occur we would catch it early.  She has a good understanding of this plan and agrees with it.  I personally saw this patient and performed a substantive portion of this encounter with the listed APP documented above.   Chauncey Cruel, MD Medical Oncology and Hematology [facility] [address] [place], [place] [address] Tel. [phone]    Fax. [phone]

## 2017-08-18 ENCOUNTER — Ambulatory Visit (HOSPITAL_BASED_OUTPATIENT_CLINIC_OR_DEPARTMENT_OTHER)
Admission: RE | Admit: 2017-08-18 | Discharge: 2017-08-18 | Disposition: A | Discharge status: Home / Self Care | Payer: 59 | Source: Ambulatory Visit | Attending: Cardiology | Admitting: Cardiology

## 2017-08-18 ENCOUNTER — Other Ambulatory Visit (HOSPITAL_COMMUNITY): Payer: 59

## 2017-08-18 ENCOUNTER — Ambulatory Visit (HOSPITAL_COMMUNITY)
Admission: RE | Admit: 2017-08-18 | Discharge: 2017-08-18 | Disposition: A | Discharge status: Home / Self Care | Payer: 59 | Source: Ambulatory Visit | Attending: Family Medicine | Admitting: Family Medicine

## 2017-08-18 ENCOUNTER — Encounter (HOSPITAL_COMMUNITY): Payer: Self-pay | Admitting: Cardiology

## 2017-08-18 VITALS — BP 120/74 | HR 79 | Wt 145.0 lb

## 2017-08-18 DIAGNOSIS — Z170 Estrogen receptor positive status [ER+]: Secondary | ICD-10-CM | POA: Diagnosis present

## 2017-08-18 DIAGNOSIS — C50411 Malignant neoplasm of upper-outer quadrant of right female breast: Secondary | ICD-10-CM | POA: Insufficient documentation

## 2017-08-18 DIAGNOSIS — I313 Pericardial effusion (noninflammatory): Secondary | ICD-10-CM | POA: Diagnosis not present

## 2017-08-18 LAB — ECHOCARDIOGRAM COMPLETE
CHL CUP MV DEC (S): 239
E/e' ratio: 8.31
EWDT: 239 ms
FS: 27 % — AB (ref 28–44)
IVS/LV PW RATIO, ED: 0.78
LA ID, A-P, ES: 29 mm
LA vol A4C: 27.2 ml
LADIAMINDEX: 1.66 cm/m2
LEFT ATRIUM END SYS DIAM: 29 mm
LV E/e' medial: 8.31
LV TDI E'MEDIAL: 13.6
LVEEAVG: 8.31
LVELAT: 9.9 cm/s
LVOT VTI: 19 cm
LVOT area: 2.54 cm2
LVOT peak grad rest: 4 mmHg
LVOTD: 18 mm
LVOTPV: 106 cm/s
LVOTSV: 48 mL
Lateral S' vel: 12 cm/s
MV Peak grad: 3 mmHg
MV pk A vel: 58.7 m/s
MV pk E vel: 82.3 m/s
PW: 10.2 mm — AB (ref 0.6–1.1)
RV TAPSE: 19.8 mm
TDI e' lateral: 9.9

## 2017-08-18 NOTE — Progress Notes (Signed)
  Echocardiogram 2D Echocardiogram has been performed.  Johny Chess 08/18/2017, 2:05 PM

## 2017-08-18 NOTE — Progress Notes (Signed)
Oncology: Dr. Jana Hakim  46 yo with no significant past history presents for cardio-oncology evaluation with triple positive breast cancer.  Breast cancer was diagnosed 3/18, ER+/PR+/HER2+.  She has been getting chemotherapy starting on 02/16/17 with carboplatin, docetaxel, trastuzumab, pertuzumab, planned for 6 cycles. She has 1 cycle to go. She will then continue Herceptin to complete 1 year.  She will have definitive surgery and radiation after chemo.   She has no prior cardiac disease. Uncle with CABG in his 56s, otherwise no heart disease in her family.  She is a prior smoker.  No exertional dyspnea or chest pain, good exercise tolerance.   Returns today for HF follow up. Feeling well, walking around the neighborhood without SOB. Has plenty of energy.    PMH: 1.  Breast cancer: Diagnosed 3/18, ER+/PR+/HER2+.  She will get chemotherapy starting on 02/16/17 with carboplatin, docetaxel, trastuzumab, pertuzumab x 6 cycles.  She will then continue Herceptin to complete 1 year.  She will have definitive surgery and radiation after chemo.  - Echo (3/18): EF 41-96%, normal diastolic function, GLS -22.2%.  - Echo (7/18): EF 55-60%, GLS -19.7% - Echo (10/18): EF 55-60%, GLS - 18.6. Small pericardial effusion adjacent to right ventricle.   Social History   Social History  . Marital status: Married    Spouse name: N/A  . Number of children: 2  . Years of education: N/A   Occupational History  . Not on file.   Social History Main Topics  . Smoking status: Former Smoker    Quit date: 02/01/2017  . Smokeless tobacco: Never Used  . Alcohol use 3.6 oz/week    6 Glasses of wine per week     Comment: socially  . Drug use: No  . Sexual activity: Not on file   Other Topics Concern  . Not on file   Social History Narrative  . No narrative on file   Family History  Problem Relation Age of Onset  . Hypertension Mother   . Diabetes Father   . Leukemia Paternal Grandfather        d.64    . Breast cancer Cousin 40       Bilateral breast cancer. Daughter of paternal uncle with leukemia.  . Leukemia Paternal Uncle 26  . Lung cancer Maternal Uncle 74  . Brain cancer Maternal Aunt 65       d.66  . Cancer Maternal Uncle        unspecified type  . Brain cancer Cousin 42       d.55 daughter of maternal aunt with brain cancer   ROS: All systems reviewed and negative except as per HPI.   Current Outpatient Prescriptions  Medication Sig Dispense Refill  . diphenhydrAMINE (BENADRYL) 25 mg capsule Take 25 mg by mouth as needed.    Marland Kitchen ibuprofen (ADVIL,MOTRIN) 200 MG tablet Take 200 mg by mouth.    . tamoxifen (NOLVADEX) 20 MG tablet Take by mouth.     No current facility-administered medications for this encounter.    BP 120/74   Pulse 79   Wt 145 lb (65.8 kg)   SpO2 100%   BMI 23.40 kg/m  General: Well appearing. No resp difficulty. HEENT: Normal Neck: Supple. JVP 5-6. Carotids 2+ bilat; no bruits. No thyromegaly or nodule noted. Cor: PMI nondisplaced. RRR, No M/G/R noted Lungs: CTAB, normal effort. Abdomen: Soft, non-tender, non-distended, no HSM. No bruits or masses. +BS  Extremities: No cyanosis, clubbing, rash, R and LLE no edema.  Neuro: Alert & orientedx3, cranial nerves grossly intact. moves all 4 extremities w/o difficulty. Affect pleasant   Assessment/Plan:  1. Breast CA: estrogen and progesterone receptor positive, HER-2 not amplified, with an Mib-1 of 50%.  She will be continuing on Herceptin for a year and is getting echo screening for cardio-toxicity.  - Follows with Dr. Jana Hakim.  - Will continue with Herceptin for a total of one year (to end April 2019).  - Echo today with stable EF 55-60%. GLS stable. Small pericardial effusion noted on Echo, Dr. Aundra Dubin reviewed Echo from July, there was a small pericardial effusion on that Echo as well that seems to have progressed slightly. Will follow up in 3 months with Echo to re evaluate.  - Follow up in 3  months with an Echo.   Arbutus Leas 08/18/2017  Patient seen with NP, agree with the above note.  I reviewed today's echo: EF and strain parameters stable.  There is a small pericardial effusion adjacent to the RV on today's echo.  This was present but smaller on prior echo.  No changes to therapy, will repeat echo in 3 months as she will remain on Herceptin and will follow pericardial effusion.   [name] 08/20/2017

## 2017-08-24 ENCOUNTER — Ambulatory Visit (HOSPITAL_BASED_OUTPATIENT_CLINIC_OR_DEPARTMENT_OTHER): Payer: 59

## 2017-08-24 ENCOUNTER — Other Ambulatory Visit (HOSPITAL_BASED_OUTPATIENT_CLINIC_OR_DEPARTMENT_OTHER): Payer: 59

## 2017-08-24 VITALS — BP 130/59 | HR 70 | Temp 98.1°F | Resp 18

## 2017-08-24 DIAGNOSIS — C50411 Malignant neoplasm of upper-outer quadrant of right female breast: Secondary | ICD-10-CM

## 2017-08-24 DIAGNOSIS — Z5112 Encounter for antineoplastic immunotherapy: Secondary | ICD-10-CM | POA: Diagnosis not present

## 2017-08-24 DIAGNOSIS — C50511 Malignant neoplasm of lower-outer quadrant of right female breast: Secondary | ICD-10-CM

## 2017-08-24 DIAGNOSIS — Z170 Estrogen receptor positive status [ER+]: Principal | ICD-10-CM

## 2017-08-24 LAB — CBC WITH DIFFERENTIAL/PLATELET
BASO%: 0.7 % (ref 0.0–2.0)
Basophils Absolute: 0 10*3/uL (ref 0.0–0.1)
EOS ABS: 0.2 10*3/uL (ref 0.0–0.5)
EOS%: 4 % (ref 0.0–7.0)
HEMATOCRIT: 33.6 % — AB (ref 34.8–46.6)
HEMOGLOBIN: 11.4 g/dL — AB (ref 11.6–15.9)
LYMPH#: 2.3 10*3/uL (ref 0.9–3.3)
LYMPH%: 36.5 % (ref 14.0–49.7)
MCH: 26.1 pg (ref 25.1–34.0)
MCHC: 33.8 g/dL (ref 31.5–36.0)
MCV: 77.4 fL — ABNORMAL LOW (ref 79.5–101.0)
MONO#: 0.6 10*3/uL (ref 0.1–0.9)
MONO%: 9.6 % (ref 0.0–14.0)
NEUT%: 49.2 % (ref 38.4–76.8)
NEUTROS ABS: 3.1 10*3/uL (ref 1.5–6.5)
Platelets: 234 10*3/uL (ref 145–400)
RBC: 4.34 10*6/uL (ref 3.70–5.45)
RDW: 17.5 % — AB (ref 11.2–14.5)
WBC: 6.2 10*3/uL (ref 3.9–10.3)

## 2017-08-24 LAB — COMPREHENSIVE METABOLIC PANEL
ALBUMIN: 4.3 g/dL (ref 3.5–5.0)
ALK PHOS: 69 U/L (ref 40–150)
ALT: 7 U/L (ref 0–55)
AST: 15 U/L (ref 5–34)
Anion Gap: 10 mEq/L (ref 3–11)
BUN: 9.1 mg/dL (ref 7.0–26.0)
CALCIUM: 9.8 mg/dL (ref 8.4–10.4)
CO2: 27 mEq/L (ref 22–29)
CREATININE: 0.9 mg/dL (ref 0.6–1.1)
Chloride: 104 mEq/L (ref 98–109)
EGFR: >60 mL/min/{1.73_m2} (ref >60)
GLUCOSE: 96 mg/dL (ref 70–140)
Potassium: 4 mEq/L (ref 3.5–5.1)
SODIUM: 140 meq/L (ref 136–145)
TOTAL PROTEIN: 8.4 g/dL — AB (ref 6.4–8.3)
Total Bilirubin: 0.27 mg/dL (ref 0.20–1.20)

## 2017-08-24 MED ORDER — ACETAMINOPHEN 325 MG PO TABS
650.0000 mg | ORAL_TABLET | Freq: Once | ORAL | Status: AC
  Administered 2017-08-24: 650 mg via ORAL

## 2017-08-24 MED ORDER — SODIUM CHLORIDE 0.9 % IV SOLN
6.0000 mg/kg | Freq: Once | INTRAVENOUS | Status: AC
  Administered 2017-08-24: 420 mg via INTRAVENOUS
  Filled 2017-08-24: qty 20

## 2017-08-24 MED ORDER — ACETAMINOPHEN 325 MG PO TABS
ORAL_TABLET | ORAL | Status: AC
  Filled 2017-08-24: qty 2

## 2017-08-24 MED ORDER — SODIUM CHLORIDE 0.9% FLUSH
10.0000 mL | INTRAVENOUS | Status: DC | PRN
  Administered 2017-08-24: 10 mL
  Filled 2017-08-24: qty 10

## 2017-08-24 MED ORDER — SODIUM CHLORIDE 0.9 % IV SOLN
Freq: Once | INTRAVENOUS | Status: AC
  Administered 2017-08-24: 15:00:00 via INTRAVENOUS

## 2017-08-24 MED ORDER — DIPHENHYDRAMINE HCL 25 MG PO CAPS
ORAL_CAPSULE | ORAL | Status: AC
  Filled 2017-08-24: qty 1

## 2017-08-24 MED ORDER — DIPHENHYDRAMINE HCL 25 MG PO CAPS
25.0000 mg | ORAL_CAPSULE | Freq: Once | ORAL | Status: AC
  Administered 2017-08-24: 25 mg via ORAL

## 2017-08-24 MED ORDER — HEPARIN SOD (PORK) LOCK FLUSH 100 UNIT/ML IV SOLN
500.0000 [IU] | Freq: Once | INTRAVENOUS | Status: AC | PRN
  Administered 2017-08-24: 500 [IU]
  Filled 2017-08-24: qty 5

## 2017-08-24 NOTE — Patient Instructions (Signed)
[facility] Discharge Instructions for Patients Receiving Chemotherapy  Today you received the following chemotherapy agents Herceptin  To help prevent nausea and vomiting after your treatment, we encourage you to take your nausea medication as directed   If you develop nausea and vomiting that is not controlled by your nausea medication, call the clinic.   BELOW ARE SYMPTOMS THAT SHOULD BE REPORTED IMMEDIATELY:  *FEVER GREATER THAN 100.5 F  *CHILLS WITH OR WITHOUT FEVER  NAUSEA AND VOMITING THAT IS NOT CONTROLLED WITH YOUR NAUSEA MEDICATION  *UNUSUAL SHORTNESS OF BREATH  *UNUSUAL BRUISING OR BLEEDING  TENDERNESS IN MOUTH AND THROAT WITH OR WITHOUT PRESENCE OF ULCERS  *URINARY PROBLEMS  *BOWEL PROBLEMS  UNUSUAL RASH Items with * indicate a potential emergency and should be followed up as soon as possible.  Feel free to call the clinic should you have any questions or concerns. The clinic phone number is [phone].  Please show the CHEMO ALERT CARD at check-in to the Emergency Department and triage nurse.   

## 2017-09-06 ENCOUNTER — Telehealth: Payer: Self-pay

## 2017-09-06 NOTE — Telephone Encounter (Signed)
Spoke with patient to remind of SCP visit on 09/14/17 at 1:30 pm.  Patient aware because she already has labs and tx scheduled that day.

## 2017-09-13 ENCOUNTER — Other Ambulatory Visit: Payer: Self-pay | Admitting: Oncology

## 2017-09-14 ENCOUNTER — Ambulatory Visit (HOSPITAL_BASED_OUTPATIENT_CLINIC_OR_DEPARTMENT_OTHER): Payer: 59 | Admitting: Adult Health

## 2017-09-14 ENCOUNTER — Ambulatory Visit: Payer: 59 | Admitting: Oncology

## 2017-09-14 ENCOUNTER — Other Ambulatory Visit: Payer: 59

## 2017-09-14 ENCOUNTER — Ambulatory Visit (HOSPITAL_BASED_OUTPATIENT_CLINIC_OR_DEPARTMENT_OTHER): Payer: 59

## 2017-09-14 ENCOUNTER — Other Ambulatory Visit: Payer: Self-pay | Admitting: Oncology

## 2017-09-14 ENCOUNTER — Ambulatory Visit: Payer: 59

## 2017-09-14 ENCOUNTER — Encounter: Payer: Self-pay | Admitting: Adult Health

## 2017-09-14 ENCOUNTER — Other Ambulatory Visit (HOSPITAL_BASED_OUTPATIENT_CLINIC_OR_DEPARTMENT_OTHER): Payer: 59

## 2017-09-14 VITALS — BP 125/65 | HR 79 | Temp 98.1°F | Resp 18 | Ht 66.0 in | Wt 144.6 lb

## 2017-09-14 DIAGNOSIS — Z79810 Long term (current) use of selective estrogen receptor modulators (SERMs): Secondary | ICD-10-CM | POA: Diagnosis not present

## 2017-09-14 DIAGNOSIS — C50511 Malignant neoplasm of lower-outer quadrant of right female breast: Secondary | ICD-10-CM

## 2017-09-14 DIAGNOSIS — C50411 Malignant neoplasm of upper-outer quadrant of right female breast: Secondary | ICD-10-CM

## 2017-09-14 DIAGNOSIS — Z5112 Encounter for antineoplastic immunotherapy: Secondary | ICD-10-CM | POA: Diagnosis not present

## 2017-09-14 DIAGNOSIS — Z170 Estrogen receptor positive status [ER+]: Principal | ICD-10-CM

## 2017-09-14 LAB — COMPREHENSIVE METABOLIC PANEL
ALT: 9 U/L (ref 0–55)
AST: 16 U/L (ref 5–34)
Albumin: 4.1 g/dL (ref 3.5–5.0)
Alkaline Phosphatase: 54 U/L (ref 40–150)
Anion Gap: 10 mEq/L (ref 3–11)
BILIRUBIN TOTAL: 0.47 mg/dL (ref 0.20–1.20)
BUN: 6.1 mg/dL — ABNORMAL LOW (ref 7.0–26.0)
CHLORIDE: 104 meq/L (ref 98–109)
CO2: 25 meq/L (ref 22–29)
Calcium: 9.7 mg/dL (ref 8.4–10.4)
Creatinine: 0.9 mg/dL (ref 0.6–1.1)
GLUCOSE: 77 mg/dL (ref 70–140)
Potassium: 4.4 mEq/L (ref 3.5–5.1)
SODIUM: 139 meq/L (ref 136–145)
TOTAL PROTEIN: 7.9 g/dL (ref 6.4–8.3)

## 2017-09-14 LAB — CBC WITH DIFFERENTIAL/PLATELET
BASO%: 0.7 % (ref 0.0–2.0)
Basophils Absolute: 0 10*3/uL (ref 0.0–0.1)
EOS ABS: 0.1 10*3/uL (ref 0.0–0.5)
EOS%: 1.4 % (ref 0.0–7.0)
HCT: 33.4 % — ABNORMAL LOW (ref 34.8–46.6)
HGB: 11.3 g/dL — ABNORMAL LOW (ref 11.6–15.9)
LYMPH%: 34.1 % (ref 14.0–49.7)
MCH: 25.7 pg (ref 25.1–34.0)
MCHC: 34 g/dL (ref 31.5–36.0)
MCV: 75.8 fL — AB (ref 79.5–101.0)
MONO#: 0.5 10*3/uL (ref 0.1–0.9)
MONO%: 10.5 % (ref 0.0–14.0)
NEUT%: 53.3 % (ref 38.4–76.8)
NEUTROS ABS: 2.7 10*3/uL (ref 1.5–6.5)
Platelets: 228 10*3/uL (ref 145–400)
RBC: 4.41 10*6/uL (ref 3.70–5.45)
RDW: 17.1 % — ABNORMAL HIGH (ref 11.2–14.5)
WBC: 5.1 10*3/uL (ref 3.9–10.3)
lymph#: 1.7 10*3/uL (ref 0.9–3.3)

## 2017-09-14 MED ORDER — ACETAMINOPHEN 325 MG PO TABS
ORAL_TABLET | ORAL | Status: AC
  Filled 2017-09-14: qty 2

## 2017-09-14 MED ORDER — SODIUM CHLORIDE 0.9% FLUSH
10.0000 mL | INTRAVENOUS | Status: DC | PRN
  Administered 2017-09-14: 10 mL
  Filled 2017-09-14: qty 10

## 2017-09-14 MED ORDER — HEPARIN SOD (PORK) LOCK FLUSH 100 UNIT/ML IV SOLN
500.0000 [IU] | Freq: Once | INTRAVENOUS | Status: AC | PRN
  Administered 2017-09-14: 500 [IU]
  Filled 2017-09-14: qty 5

## 2017-09-14 MED ORDER — ACETAMINOPHEN 325 MG PO TABS
650.0000 mg | ORAL_TABLET | Freq: Once | ORAL | Status: AC
  Administered 2017-09-14: 650 mg via ORAL

## 2017-09-14 MED ORDER — SODIUM CHLORIDE 0.9 % IV SOLN
Freq: Once | INTRAVENOUS | Status: AC
  Administered 2017-09-14: 15:00:00 via INTRAVENOUS

## 2017-09-14 MED ORDER — TRASTUZUMAB CHEMO 150 MG IV SOLR
6.0000 mg/kg | Freq: Once | INTRAVENOUS | Status: AC
  Administered 2017-09-14: 420 mg via INTRAVENOUS
  Filled 2017-09-14: qty 20

## 2017-09-14 MED ORDER — DIPHENHYDRAMINE HCL 25 MG PO CAPS
25.0000 mg | ORAL_CAPSULE | Freq: Once | ORAL | Status: AC
  Administered 2017-09-14: 25 mg via ORAL

## 2017-09-14 MED ORDER — DIPHENHYDRAMINE HCL 25 MG PO CAPS
ORAL_CAPSULE | ORAL | Status: AC
  Filled 2017-09-14: qty 1

## 2017-09-14 NOTE — Patient Instructions (Signed)
[facility] Discharge Instructions for Patients Receiving Chemotherapy  Today you received the following chemotherapy agents Herceptin  To help prevent nausea and vomiting after your treatment, we encourage you to take your nausea medication as directed   If you develop nausea and vomiting that is not controlled by your nausea medication, call the clinic.   BELOW ARE SYMPTOMS THAT SHOULD BE REPORTED IMMEDIATELY:  *FEVER GREATER THAN 100.5 F  *CHILLS WITH OR WITHOUT FEVER  NAUSEA AND VOMITING THAT IS NOT CONTROLLED WITH YOUR NAUSEA MEDICATION  *UNUSUAL SHORTNESS OF BREATH  *UNUSUAL BRUISING OR BLEEDING  TENDERNESS IN MOUTH AND THROAT WITH OR WITHOUT PRESENCE OF ULCERS  *URINARY PROBLEMS  *BOWEL PROBLEMS  UNUSUAL RASH Items with * indicate a potential emergency and should be followed up as soon as possible.  Feel free to call the clinic should you have any questions or concerns. The clinic phone number is [phone].  Please show the CHEMO ALERT CARD at check-in to the Emergency Department and triage nurse.   

## 2017-09-14 NOTE — Progress Notes (Signed)
CLINIC:  Survivorship   REASON FOR VISIT:  Routine follow-up post-treatment for a recent history of breast cancer.  BRIEF ONCOLOGIC HISTORY:    Malignant neoplasm of upper-outer quadrant of right breast in female, estrogen receptor positive (Olivia Frederick)   01/31/2017 Initial Biopsy    Right bresat biopsy: IDC, grade 2, ER+(100%), PR+(25%), Ki-67 25%m HER-2+(ratio 2.35). 1 axillary lymph node was negative for malignancy.       02/02/2017 Initial Diagnosis    Malignant neoplasm of upper-outer quadrant of right breast in female, estrogen receptor positive (Olivia Frederick)     02/16/2017 - 06/01/2017 Neo-Adjuvant Chemotherapy    Docetaxel, Carboplatin, Trastuzumab, Pertuzumab x 6  Pertuzumab omitted with cycle 5 and 6, Docetaxel omitted with cycle 6.      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   The remaining 45 genes analyzed by [facility]'s 46-gene Common Hereditary Cancers Panel were negative for mutations. Genes analyzed include: APC, ATM, AXIN2, BARD1, BMPR1A, BRCA1, BRCA2, BRIP1, CDH1, CDKN2A, CHEK2, CTNNA1, DICER1, EPCAM, GREM1, HOXB13, KIT, MEN1, MLH1, MSH2, MSH3, MSH6, MUTYH, NBN, NF1, NTHL1, PALB2, PDGFRA, PMS2, POLD1, POLE, PTEN, RAD50, RAD51C, RAD51D, SDHA, SDHB, SDHC, SDHD, SMAD4, SMARCA4, STK11, TP53, TSC1, TSC2, and VHL. The result report is dated for 03/28/2017.      06/22/2017 -  Adjuvant Chemotherapy    Maintenance Trastuzumab to complete one year      06/27/2017 Surgery    Bilateral nipple sparing Mastectomies with right SLNB: left benign, Right: . 3 foci of residual IDC, 3.2cm, 1.6cm, and 0.7 cm, DCIS, LVI, margins negative, 7 LN negative. ER+(95%), PR+(10%), HER-2+(ratio 2.03).       07/2017 -  Anti-estrogen oral therapy    Tamoxifen daily        INTERVAL HISTORY:  Olivia Frederick presents to the [facility] today for our initial meeting to review her survivorship care plan detailing her treatment course for breast cancer, as well as monitoring long-term side effects of that treatment, education regarding health maintenance, screening, and overall wellness and health promotion.     Overall, Olivia Frederick reports feeling quite well.  She is going to receive Trastuzumab today.  She is takin gTamoxifen daily and is tolerating it well.  She wants to be referred to a gynecologist who has worked with Tamoxifen previously.      REVIEW OF SYSTEMS:  Review of Systems  Constitutional: Negative for appetite change, chills, fatigue, fever and unexpected weight change.  HENT:   Negative for hearing loss and lump/mass.   Eyes: Negative for eye problems and icterus.  Respiratory: Negative for chest tightness, cough and shortness of breath.   Cardiovascular: Negative for chest pain, leg swelling and palpitations.  Gastrointestinal: Negative for abdominal distention, abdominal pain, constipation, diarrhea, nausea and vomiting.  Endocrine: Negative for hot flashes.  Musculoskeletal: Negative for arthralgias.  Skin: Negative for itching and rash.  Neurological: Negative for dizziness, extremity weakness, headaches and numbness.  Hematological: Negative for adenopathy. Does not bruise/bleed easily.  Psychiatric/Behavioral: Negative for depression. The patient is not nervous/anxious.   Breast: Denies any new nodularity, masses, tenderness, nipple changes, or nipple discharge.      ONCOLOGY TREATMENT TEAM:  1. Surgeon:  Dr. Excell Seltzer at [facility] 2. Medical Oncologist: Dr. Jana Hakim  3. Radiation Oncologist: Dr. Sondra Come  PAST MEDICAL/SURGICAL HISTORY:  Past Medical History:  Diagnosis Date  . Breast cancer ([place])   . Cervical dysplasia   . Genetic testing 04/24/2017   Olivia Frederick underwent genetic  counseling and testing for hereditary cancer syndromes on 03/08/2017. 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   The remaining 45 genes analyzed by [facility]'s 46-gene Common Hereditary Cancers Panel were negative for mutations. Genes analyzed include: APC, ATM, AXIN2, BARD1, BMPR1A, BRCA1, BRCA2, BRIP1, CDH1, CDKN2   Past Surgical History:  Procedure Laterality Date  . BREAST EXCISIONAL BIOPSY Right   . BREAST SURGERY     right breast benign tumor   . CESAREAN SECTION     x two     ALLERGIES:  No Known Allergies   CURRENT MEDICATIONS:  Outpatient Encounter Medications as of 09/14/2017  Medication Sig  . diphenhydrAMINE (BENADRYL) 25 mg capsule Take 25 mg by mouth as needed.  Marland Kitchen ibuprofen (ADVIL,MOTRIN) 200 MG tablet Take 200 mg by mouth.  . tamoxifen (NOLVADEX) 20 MG tablet Take by mouth.   No facility-administered encounter medications on file as of 09/14/2017.      ONCOLOGIC FAMILY HISTORY:  Family History  Problem Relation Age of Onset  . Hypertension Mother   . Diabetes Father   . Leukemia Paternal Grandfather        d.64  . Breast cancer Cousin 40       Bilateral breast cancer. Daughter of paternal uncle with leukemia.  . Leukemia Paternal Uncle 42  . Lung cancer Maternal Uncle 74  . Brain cancer Maternal Aunt 65       d.66  . Cancer Maternal Uncle        unspecified type  . Brain cancer Cousin 99       d.55 daughter of maternal aunt with brain cancer     GENETIC COUNSELING/TESTING: See above   PHYSICAL EXAMINATION:  Vital Signs:   Vitals:   09/14/17 1344  BP: 125/65  Pulse: 79  Resp: 18  Temp: 98.1 F (36.7 C)  SpO2: 100%   Filed Weights   09/14/17 1344  Weight: 144 lb 9.6 oz (65.6 kg)   General: Well-nourished, well-appearing female in no acute distress.  She is unaccompanied today.   HEENT: Head is normocephalic.  Pupils equal and reactive to light. Conjunctivae clear without exudate.  Sclerae  anicteric. Oral mucosa is pink, moist.  Oropharynx is pink without lesions or erythema.  Lymph: No cervical, supraclavicular, or infraclavicular lymphadenopathy noted on palpation.  Cardiovascular: Regular rate and rhythm.Marland Kitchen Respiratory: Clear to auscultation bilaterally. Chest expansion symmetric; breathing non-labored.  GI: Abdomen soft and round; non-tender, non-distended. Bowel sounds normoactive.  GU: Deferred.  Neuro: No focal deficits. Steady gait.  Psych: Mood and affect normal and appropriate for situation.  Extremities: No edema. MSK: No focal spinal tenderness to palpation.  Full range of motion in bilateral upper extremities Skin: Warm and dry.  LABORATORY DATA:  None for this visit.  DIAGNOSTIC IMAGING:  None for this visit.      ASSESSMENT AND PLAN:  Ms.. Olivia Frederick is a pleasant 46 y.o. female with Stage IA right breast invasive ductal carcinoma, ER+/PR+/HER2+, diagnosed in 01/2017, treated with neo-adjuvant chemotherapy, lumpectomy, maintenance trastuzumab, and anti-estrogen therapy with Tamoxifen beginning in 07/2017.  She presents to the Survivorship Clinic for our initial meeting and routine follow-up post-completion of treatment for breast cancer.    1. Stage IA right breast cancer:  Ms. Cerveny is continuing to recover from definitive  treatment for breast cancer. She will follow-up with her medical oncologist, Dr. Jana Hakim in  with history and physical exam per surveillance protocol.  She will continue her anti-estrogen therapy with Tamoxifen. Thus far, she is tolerating the Tamoxifen well, with minimal side effects. She will proceed with Trastuzumab today, her last echo was on 10/12 and was normal. Today, a comprehensive survivorship care plan and treatment summary was reviewed with the patient today detailing her breast cancer diagnosis, treatment course, potential late/long-term effects of treatment, appropriate follow-up care with recommendations for the future,  and patient education resources.  A copy of this summary, along with a letter will be sent to the patient's primary care provider via mail/fax/In Basket message after today's visit.    2. Bone health:  Given Olivia Frederick's history of breast cancer she is at slight risk for bone demineralization.  She was counseled that Tamoxifen will have a protective effect on her bones.  She was given education on specific activities to promote bone health.  3. Cancer screening:  Due to Olivia Frederick's history and her age, she should receive screening for skin cancers, colon cancer, and gynecologic cancers.  I recommended Dr. Garwin Brothers to her today.  The information and recommendations are listed on the patient's comprehensive care plan/treatment summary and were reviewed in detail with the patient.    4. Health maintenance and wellness promotion: Ms. Savin was encouraged to consume 5-7 servings of fruits and vegetables per day. We reviewed the "Nutrition Rainbow" handout, as well as the handout "Take Control of Your Health and Reduce Your Cancer Risk" from the [employer].  She was also encouraged to engage in moderate to vigorous exercise for 30 minutes per day most days of the week. We discussed the [employer] fitness program, which is designed for cancer survivors to help them become more physically fit after cancer treatments.  She was instructed to limit her alcohol consumption and continue to abstain from tobacco use.     5. Support services/counseling: It is not uncommon for this period of the patient's cancer care trajectory to be one of many emotions and stressors.  We discussed an opportunity for her to participate in the next session of [facility] ("Finding Your New Normal") support group series designed for patients after they have completed treatment.   Ms. Mccall was encouraged to take advantage of our many other support services programs, support groups, and/or counseling in  coping with her new life as a cancer survivor after completing anti-cancer treatment.  She was offered support today through active listening and expressive supportive counseling.  She was given information regarding our available services and encouraged to contact me with any questions or for help enrolling in any of our support group/programs.    Dispo:   -Return to cancer center every three weeks for labs/Trastuzumab, f/u with Dr. Jana Hakim in January  -Mammogram due in n/a, s/p bilateral mastectomies -She is welcome to return back to the Survivorship Clinic at any time; no additional follow-up needed at this time.  -Consider referral back to survivorship as a long-term survivor for continued surveillance  A total of (30) minutes of face-to-face time was spent with this patient with greater than 50% of that time in counseling and care-coordination.   Gardenia Phlegm, NP Survivorship Program [facility] [phone]   Note: PRIMARY CARE PROVIDER Ann Held, [place] [phone]

## 2017-09-15 ENCOUNTER — Ambulatory Visit: Payer: 59 | Admitting: Oncology

## 2017-09-15 ENCOUNTER — Other Ambulatory Visit: Payer: 59

## 2017-09-15 ENCOUNTER — Ambulatory Visit: Payer: 59

## 2017-09-26 ENCOUNTER — Encounter (HOSPITAL_BASED_OUTPATIENT_CLINIC_OR_DEPARTMENT_OTHER): Payer: Self-pay | Admitting: *Deleted

## 2017-09-26 ENCOUNTER — Other Ambulatory Visit: Payer: Self-pay

## 2017-09-26 NOTE — Pre-Procedure Instructions (Signed)
To come pick up Ensure pre-surgery drink 10 oz. - to drink by 0915 DOS. 

## 2017-09-26 NOTE — Progress Notes (Signed)
Ensure pre surgery drink given with instructions to complete by 0915 dos, pt verbalized understanding. 

## 2017-10-02 ENCOUNTER — Encounter (HOSPITAL_BASED_OUTPATIENT_CLINIC_OR_DEPARTMENT_OTHER): Payer: Self-pay | Admitting: Certified Registered"

## 2017-10-02 ENCOUNTER — Encounter (HOSPITAL_BASED_OUTPATIENT_CLINIC_OR_DEPARTMENT_OTHER)
Admission: RE | Disposition: A | Discharge status: Home / Self Care | Payer: Self-pay | Source: Ambulatory Visit | Attending: Plastic Surgery

## 2017-10-02 ENCOUNTER — Ambulatory Visit (HOSPITAL_BASED_OUTPATIENT_CLINIC_OR_DEPARTMENT_OTHER): Payer: 59 | Admitting: Certified Registered"

## 2017-10-02 ENCOUNTER — Other Ambulatory Visit: Payer: Self-pay | Admitting: Plastic Surgery

## 2017-10-02 ENCOUNTER — Other Ambulatory Visit: Payer: Self-pay

## 2017-10-02 ENCOUNTER — Ambulatory Visit (HOSPITAL_BASED_OUTPATIENT_CLINIC_OR_DEPARTMENT_OTHER)
Admission: RE | Admit: 2017-10-02 | Discharge: 2017-10-02 | Disposition: A | Discharge status: Home / Self Care | Payer: 59 | Source: Ambulatory Visit | Attending: Plastic Surgery | Admitting: Plastic Surgery

## 2017-10-02 DIAGNOSIS — Z87891 Personal history of nicotine dependence: Secondary | ICD-10-CM | POA: Diagnosis not present

## 2017-10-02 DIAGNOSIS — D649 Anemia, unspecified: Secondary | ICD-10-CM | POA: Diagnosis not present

## 2017-10-02 DIAGNOSIS — Z170 Estrogen receptor positive status [ER+]: Secondary | ICD-10-CM | POA: Diagnosis not present

## 2017-10-02 DIAGNOSIS — Z1501 Genetic susceptibility to malignant neoplasm of breast: Secondary | ICD-10-CM | POA: Insufficient documentation

## 2017-10-02 DIAGNOSIS — Z421 Encounter for breast reconstruction following mastectomy: Secondary | ICD-10-CM | POA: Diagnosis not present

## 2017-10-02 DIAGNOSIS — Z853 Personal history of malignant neoplasm of breast: Secondary | ICD-10-CM | POA: Insufficient documentation

## 2017-10-02 DIAGNOSIS — Z9221 Personal history of antineoplastic chemotherapy: Secondary | ICD-10-CM | POA: Insufficient documentation

## 2017-10-02 HISTORY — DX: Personal history of malignant neoplasm of breast: Z85.3

## 2017-10-02 HISTORY — DX: Personal history of antineoplastic chemotherapy: Z92.21

## 2017-10-02 HISTORY — PX: REMOVAL OF BILATERAL TISSUE EXPANDERS WITH PLACEMENT OF BILATERAL BREAST IMPLANTS: SHX6431

## 2017-10-02 SURGERY — REMOVAL, TISSUE EXPANDER, BREAST, BILATERAL, WITH BILATERAL IMPLANT IMPLANT INSERTION
Anesthesia: General | Site: Breast | Laterality: Bilateral

## 2017-10-02 MED ORDER — HYDROMORPHONE HCL 1 MG/ML IJ SOLN
INTRAMUSCULAR | Status: AC
  Filled 2017-10-02: qty 0.5

## 2017-10-02 MED ORDER — LIDOCAINE HCL (CARDIAC) 20 MG/ML IV SOLN
INTRAVENOUS | Status: DC | PRN
  Administered 2017-10-02: 60 mg via INTRAVENOUS

## 2017-10-02 MED ORDER — ONDANSETRON HCL 4 MG/2ML IJ SOLN
INTRAMUSCULAR | Status: DC | PRN
  Administered 2017-10-02: 4 mg via INTRAVENOUS

## 2017-10-02 MED ORDER — OXYCODONE HCL 5 MG/5ML PO SOLN: 5.0000 mg | Freq: Once | ORAL | Status: AC | PRN

## 2017-10-02 MED ORDER — MIDAZOLAM HCL 2 MG/2ML IJ SOLN
1.0000 mg | INTRAMUSCULAR | Status: DC | PRN
  Administered 2017-10-02: 2 mg via INTRAVENOUS

## 2017-10-02 MED ORDER — ROCURONIUM BROMIDE 100 MG/10ML IV SOLN
INTRAVENOUS | Status: DC | PRN
  Administered 2017-10-02: 50 mg via INTRAVENOUS

## 2017-10-02 MED ORDER — CEFAZOLIN SODIUM-DEXTROSE 2-4 GM/100ML-% IV SOLN
2.0000 g | INTRAVENOUS | Status: AC
  Administered 2017-10-02: 2 g via INTRAVENOUS

## 2017-10-02 MED ORDER — PROPOFOL 10 MG/ML IV BOLUS
INTRAVENOUS | Status: DC | PRN
  Administered 2017-10-02: 150 mg via INTRAVENOUS

## 2017-10-02 MED ORDER — CHLORHEXIDINE GLUCONATE CLOTH 2 % EX PADS: 6.0000 | MEDICATED_PAD | Freq: Once | CUTANEOUS | Status: DC

## 2017-10-02 MED ORDER — CELECOXIB 200 MG PO CAPS
200.0000 mg | ORAL_CAPSULE | ORAL | Status: AC
  Administered 2017-10-02: 200 mg via ORAL

## 2017-10-02 MED ORDER — LIDOCAINE 2% (20 MG/ML) 5 ML SYRINGE
INTRAMUSCULAR | Status: AC
  Filled 2017-10-02: qty 15

## 2017-10-02 MED ORDER — OXYCODONE HCL 5 MG PO TABS
5.0000 mg | ORAL_TABLET | Freq: Once | ORAL | Status: AC | PRN
  Administered 2017-10-02: 5 mg via ORAL

## 2017-10-02 MED ORDER — PROMETHAZINE HCL 25 MG/ML IJ SOLN: 6.2500 mg | INTRAMUSCULAR | Status: DC | PRN

## 2017-10-02 MED ORDER — ROCURONIUM BROMIDE 10 MG/ML (PF) SYRINGE
PREFILLED_SYRINGE | INTRAVENOUS | Status: AC
  Filled 2017-10-02: qty 5

## 2017-10-02 MED ORDER — SCOPOLAMINE 1 MG/3DAYS TD PT72: 1.0000 | MEDICATED_PATCH | Freq: Once | TRANSDERMAL | Status: DC | PRN

## 2017-10-02 MED ORDER — DEXAMETHASONE SODIUM PHOSPHATE 10 MG/ML IJ SOLN
INTRAMUSCULAR | Status: AC
  Filled 2017-10-02: qty 2

## 2017-10-02 MED ORDER — EPHEDRINE 5 MG/ML INJ
INTRAVENOUS | Status: AC
  Filled 2017-10-02: qty 20

## 2017-10-02 MED ORDER — SUGAMMADEX SODIUM 200 MG/2ML IV SOLN
INTRAVENOUS | Status: DC | PRN
  Administered 2017-10-02: 200 mg via INTRAVENOUS

## 2017-10-02 MED ORDER — SODIUM CHLORIDE 0.9 % IV SOLN
INTRAVENOUS | Status: DC | PRN
  Administered 2017-10-02: 1000 mL

## 2017-10-02 MED ORDER — ACETAMINOPHEN 500 MG PO TABS
1000.0000 mg | ORAL_TABLET | ORAL | Status: AC
  Administered 2017-10-02: 1000 mg via ORAL

## 2017-10-02 MED ORDER — CEFAZOLIN SODIUM-DEXTROSE 2-4 GM/100ML-% IV SOLN
INTRAVENOUS | Status: AC
  Filled 2017-10-02: qty 100

## 2017-10-02 MED ORDER — SULFAMETHOXAZOLE-TRIMETHOPRIM 800-160 MG PO TABS: 1.0000 | ORAL_TABLET | Freq: Two times a day (BID) | ORAL | 0 refills | Status: DC

## 2017-10-02 MED ORDER — GABAPENTIN 300 MG PO CAPS
300.0000 mg | ORAL_CAPSULE | ORAL | Status: AC
  Administered 2017-10-02: 300 mg via ORAL

## 2017-10-02 MED ORDER — OXYCODONE HCL 5 MG PO TABS
ORAL_TABLET | ORAL | Status: AC
  Filled 2017-10-02: qty 1

## 2017-10-02 MED ORDER — GABAPENTIN 300 MG PO CAPS
ORAL_CAPSULE | ORAL | Status: AC
  Filled 2017-10-02: qty 1

## 2017-10-02 MED ORDER — SUGAMMADEX SODIUM 200 MG/2ML IV SOLN
INTRAVENOUS | Status: AC
  Filled 2017-10-02: qty 2

## 2017-10-02 MED ORDER — HYDROMORPHONE HCL 1 MG/ML IJ SOLN
0.2500 mg | INTRAMUSCULAR | Status: DC | PRN
  Administered 2017-10-02 (×2): 0.5 mg via INTRAVENOUS

## 2017-10-02 MED ORDER — ACETAMINOPHEN 500 MG PO TABS
ORAL_TABLET | ORAL | Status: AC
  Filled 2017-10-02: qty 2

## 2017-10-02 MED ORDER — PROPOFOL 500 MG/50ML IV EMUL
INTRAVENOUS | Status: AC
  Filled 2017-10-02: qty 100

## 2017-10-02 MED ORDER — CELECOXIB 200 MG PO CAPS
ORAL_CAPSULE | ORAL | Status: AC
  Filled 2017-10-02: qty 1

## 2017-10-02 MED ORDER — DEXAMETHASONE SODIUM PHOSPHATE 4 MG/ML IJ SOLN
INTRAMUSCULAR | Status: DC | PRN
  Administered 2017-10-02: 10 mg via INTRAVENOUS

## 2017-10-02 MED ORDER — LACTATED RINGERS IV SOLN
INTRAVENOUS | Status: DC
  Administered 2017-10-02 (×2): via INTRAVENOUS

## 2017-10-02 MED ORDER — ONDANSETRON HCL 4 MG/2ML IJ SOLN
INTRAMUSCULAR | Status: AC
  Filled 2017-10-02: qty 8

## 2017-10-02 MED ORDER — SUCCINYLCHOLINE CHLORIDE 200 MG/10ML IV SOSY
PREFILLED_SYRINGE | INTRAVENOUS | Status: AC
  Filled 2017-10-02: qty 10

## 2017-10-02 MED ORDER — FENTANYL CITRATE (PF) 100 MCG/2ML IJ SOLN
50.0000 ug | INTRAMUSCULAR | Status: DC | PRN
  Administered 2017-10-02 (×2): 50 ug via INTRAVENOUS

## 2017-10-02 MED ORDER — FENTANYL CITRATE (PF) 100 MCG/2ML IJ SOLN
INTRAMUSCULAR | Status: AC
  Filled 2017-10-02: qty 2

## 2017-10-02 SURGICAL SUPPLY — 71 items
BAG DECANTER FOR FLEXI CONT (MISCELLANEOUS) ×3 IMPLANT
BINDER BREAST LRG (GAUZE/BANDAGES/DRESSINGS) IMPLANT
BINDER BREAST MEDIUM (GAUZE/BANDAGES/DRESSINGS) IMPLANT
BINDER BREAST XLRG (GAUZE/BANDAGES/DRESSINGS) ×3 IMPLANT
BLADE SURG 10 STRL SS (BLADE) ×3 IMPLANT
BLADE SURG 11 STRL SS (BLADE) IMPLANT
BNDG GAUZE ELAST 4 BULKY (GAUZE/BANDAGES/DRESSINGS) ×6 IMPLANT
CANISTER SUCT 1200ML W/VALVE (MISCELLANEOUS) ×6 IMPLANT
CHLORAPREP W/TINT 26ML (MISCELLANEOUS) ×6 IMPLANT
COVER BACK TABLE 60X90IN (DRAPES) ×3 IMPLANT
COVER MAYO STAND STRL (DRAPES) ×3 IMPLANT
DECANTER SPIKE VIAL GLASS SM (MISCELLANEOUS) IMPLANT
DERMABOND ADVANCED (GAUZE/BANDAGES/DRESSINGS) ×2
DERMABOND ADVANCED .7 DNX12 (GAUZE/BANDAGES/DRESSINGS) ×4 IMPLANT
DRAIN CHANNEL 15F RND FF W/TCR (WOUND CARE) IMPLANT
DRAPE TOP ARMCOVERS (MISCELLANEOUS) ×3 IMPLANT
DRAPE U-SHAPE 76X120 STRL (DRAPES) ×3 IMPLANT
DRAPE UTILITY XL STRL (DRAPES) IMPLANT
DRSG PAD ABDOMINAL 8X10 ST (GAUZE/BANDAGES/DRESSINGS) ×6 IMPLANT
ELECT BLADE 4.0 EZ CLEAN MEGAD (MISCELLANEOUS) ×3
ELECT COATED BLADE 2.86 ST (ELECTRODE) ×3 IMPLANT
ELECT REM PT RETURN 9FT ADLT (ELECTROSURGICAL) ×3
ELECTRODE BLDE 4.0 EZ CLN MEGD (MISCELLANEOUS) ×2 IMPLANT
ELECTRODE REM PT RTRN 9FT ADLT (ELECTROSURGICAL) ×2 IMPLANT
EVACUATOR SILICONE 100CC (DRAIN) IMPLANT
GLOVE BIO SURGEON STRL SZ 6 (GLOVE) ×6 IMPLANT
GOWN STRL REUS W/ TWL LRG LVL3 (GOWN DISPOSABLE) ×4 IMPLANT
GOWN STRL REUS W/TWL LRG LVL3 (GOWN DISPOSABLE) ×2
IMPL BREAST INSPI SRX 545CC (Breast) ×4 IMPLANT
IMPLANT BREAST INSPI SRX 545CC (Breast) ×6 IMPLANT
IV NS 500ML (IV SOLUTION)
IV NS 500ML BAXH (IV SOLUTION) IMPLANT
LINER CANISTER 1000CC FLEX (MISCELLANEOUS) ×3 IMPLANT
MARKER SKIN DUAL TIP RULER LAB (MISCELLANEOUS) IMPLANT
NDL SAFETY ECLIPSE 18X1.5 (NEEDLE) IMPLANT
NEEDLE HYPO 18GX1.5 SHARP (NEEDLE)
NEEDLE HYPO 25X1 1.5 SAFETY (NEEDLE) IMPLANT
NS IRRIG 1000ML POUR BTL (IV SOLUTION) ×3 IMPLANT
PACK BASIN DAY SURGERY FS (CUSTOM PROCEDURE TRAY) ×3 IMPLANT
PAD ALCOHOL SWAB (MISCELLANEOUS) ×3 IMPLANT
PENCIL BUTTON HOLSTER BLD 10FT (ELECTRODE) ×3 IMPLANT
PIN SAFETY STERILE (MISCELLANEOUS) ×3 IMPLANT
PUNCH BIOPSY DERMAL 4MM (MISCELLANEOUS) IMPLANT
SHEET MEDIUM DRAPE 40X70 STRL (DRAPES) ×6 IMPLANT
SIZER BREAST REUSE GEL 525CC (SIZER) ×3
SIZER BREAST REUSE GEL 545CC (SIZER) ×3
SIZER BREAST REUSE GEL 560CC (SIZER) ×3
SIZER BRST REUSE GEL 525CC (SIZER) ×2 IMPLANT
SIZER BRST REUSE GEL 545CC (SIZER) ×2 IMPLANT
SIZER BRST REUSE GEL 560CC (SIZER) ×2 IMPLANT
SLEEVE SCD COMPRESS KNEE MED (MISCELLANEOUS) ×3 IMPLANT
SPONGE LAP 18X18 X RAY DECT (DISPOSABLE) ×6 IMPLANT
STAPLER VISISTAT 35W (STAPLE) ×3 IMPLANT
SUT ETHILON 2 0 FS 18 (SUTURE) IMPLANT
SUT MNCRL AB 4-0 PS2 18 (SUTURE) ×6 IMPLANT
SUT PDS AB 2-0 CT2 27 (SUTURE) IMPLANT
SUT VIC AB 3-0 PS1 18 (SUTURE)
SUT VIC AB 3-0 PS1 18XBRD (SUTURE) IMPLANT
SUT VIC AB 3-0 SH 27 (SUTURE) ×2
SUT VIC AB 3-0 SH 27X BRD (SUTURE) ×4 IMPLANT
SUT VICRYL 4-0 PS2 18IN ABS (SUTURE) ×6 IMPLANT
SYR 10ML LL (SYRINGE) IMPLANT
SYR 50ML LL SCALE MARK (SYRINGE) IMPLANT
SYR BULB IRRIGATION 50ML (SYRINGE) ×6 IMPLANT
SYR CONTROL 10ML LL (SYRINGE) IMPLANT
SYR TB 1ML LL NO SAFETY (SYRINGE) IMPLANT
TOWEL OR 17X24 6PK STRL BLUE (TOWEL DISPOSABLE) ×6 IMPLANT
TUBE CONNECTING 20X1/4 (TUBING) ×6 IMPLANT
TUBING INFILTRATION IT-10001 (TUBING) IMPLANT
UNDERPAD 30X30 (UNDERPADS AND DIAPERS) ×6 IMPLANT
YANKAUER SUCT BULB TIP NO VENT (SUCTIONS) ×3 IMPLANT

## 2017-10-02 NOTE — Discharge Instructions (Signed)
SACRAL DRESSING (Lower Back)   A pressure ulcer is a sore where the skin breaks open   This dressing will be placed on your lower back to protect this area from pressure and moisture and in many cases helps prevent pressure ulcers from forming   A nurse may place this dressing before your surgery or another procedure   A nurse may also place this dressing if you have other conditions that put you at risk for developing a pressure ulcer   If you are getting up and moving around after surgery, the dressing may be taken off with your first shower. Simply remove it and throw it away.   While you are in the hospital, nurses will change the dressing twice a week as long as you are still at risk for developing a pressure ulcer   This dressing is latex free and made with silicone (for adhesive sensitivity) so it is safe and gentle to the skin    Oxycodone, pain pill given at 4;00 PM November    Post Anesthesia Home Care Instructions  Activity: Get plenty of rest for the remainder of the day. A responsible individual must stay with you for 24 hours following the procedure.  For the next 24 hours, DO NOT: -Drive a car -Paediatric nurse -Drink alcoholic beverages -Take any medication unless instructed by your physician -Make any legal decisions or sign important papers.  Meals: Start with liquid foods such as gelatin or soup. Progress to regular foods as tolerated. Avoid greasy, spicy, heavy foods. If nausea and/or vomiting occur, drink only clear liquids until the nausea and/or vomiting subsides. Call your physician if vomiting continues.  Special Instructions/Symptoms: Your throat may feel dry or sore from the anesthesia or the breathing tube placed in your throat during surgery. If this causes discomfort, gargle with warm salt water. The discomfort should disappear within 24 hours.  If you had a scopolamine patch placed behind your ear for the management of post- operative  nausea and/or vomiting:  1. The medication in the patch is effective for 72 hours, after which it should be removed.  Wrap patch in a tissue and discard in the trash. Wash hands thoroughly with soap and water. 2. You may remove the patch earlier than 72 hours if you experience unpleasant side effects which may include dry mouth, dizziness or visual disturbances. 3. Avoid touching the patch. Wash your hands with soap and water after contact with the patch.

## 2017-10-02 NOTE — H&P (Signed)
Patient ID: Olivia Frederick is a 46 y.o. female.  HPI  3 months post op bilateral NSM with TE ADM reconstructuion. Has met with Dr. Sondra Come and she has been offered post mastectomy radiation.She has discussed this with Dr. Jana Hakim and no plan for adjuvant radiation.  Presented following screening MMG with right breaat mass measuring 5 cm, palpable on exam. US demonstrated 4.6 cm mass at 12:00 location 2 cmfn with a similar appearing adjacent area measuring 1.3 cm thought to be contiguous. [place] axilla showed one LN with cortical thickening.  Biopsy of the right breast mass showed Er/PR+, HER-2 +. Biopsy of the axillary lymph node showed only reactive tissue. MRI showed three adjacent irregular masses within the upper/ central right breast measuring 5.1 x 1.8 x 3.1 cm. An additional 1.2 cm mass within the lower outer right breast noted. Second look [place] of latter completed and biopsy with IDC with DCIS, ER/PR+, Her2-. Completed neoadjuvant chemotherapy.  Final pathology three foci IDC right breast, 2.3, 1.6 and 0.7cm with LVI, margins negative, 0/7 LN.  Genetics with heterozygous CHEK 2 mutation.  Prior36 C happy with this. Right mastectomy 606 g Left 631 g     Objective:   Physical Exam  Cardiovascular: Normal rate and normal heart sounds.   Pulmonary/Chest: Effort normal.  Abdominal: Soft.    Chest: soft scars maturing,intact Depression right axilla and depression lower pole mastectomy flaps folded on itself SN to nipple R 20 L 20 cm BW R (CW) 13 cm L 13 cm Nipple to IMF R 8 L 8  Assessment:     Right breast ca UOQ ER+ Chek2 mutation Neoadjuvant chemotherapy S/p bilateral NSM, TE/Alloderm reconstruction    Plan:     Plan removal bilateral TE and placement implants. Plan smooth round Inspira capacity filled implants. Reviewed MRI surveillance silicone implants, rupture, contracture, rippling risks, infection. Reviewed purpose fat grafting to aid with contour, increase  thickness mastectomy flaps. Reviewed donor site pain, contour irregularities, need for compression, variable take graft, need to repeat procedure, fat necrosis that presents as lumps/masses, need for additional work up. She declines this at this time.  Additional risks including but not limited to seroma, hematoma, asymmetry, unacceptable cosmetic DVT/PE, damage to adjacent structures, cardiopulmonary complications, wound healing problems.  Plan OP surgery.. No drains anticipated.   She requests 4 weeks leave following surgery. Rx for oxycodone given preop.   Natrelle 133MX-12-T 400 ml tissue expanders,  RIGHT 410 ml total fill volume.  LEFT  410 ml total fill volume.       Irene Limbo, MD [facility] [phone], pin [phone]

## 2017-10-02 NOTE — Anesthesia Preprocedure Evaluation (Addendum)
Anesthesia Evaluation  Patient identified by MRN, date of birth, ID band Patient awake    Reviewed: Allergy & Precautions, NPO status , Patient's Chart, lab work & pertinent test results  Airway Mallampati: I       Dental no notable dental hx. (+) Teeth Intact   Pulmonary former smoker,    Pulmonary exam normal breath sounds clear to auscultation       Cardiovascular negative cardio ROS Normal cardiovascular exam Rhythm:Regular Rate:Normal  ECHO: Normal LV size with EF 55-60%. Strain as above. Normal diastolic function. Normal RV size and systolic function. No significant valvular abnormalities.     Neuro/Psych negative neurological ROS  negative psych ROS   GI/Hepatic negative GI ROS, Neg liver ROS,   Endo/Other  negative endocrine ROS  Renal/GU negative Renal ROS     Musculoskeletal negative musculoskeletal ROS (+)   Abdominal Normal abdominal exam  (+)   Peds  Hematology  (+) Blood dyscrasia, anemia ,   Anesthesia Other Findings HISTORY OF BREAST CANCER History of chemotherapy  Reproductive/Obstetrics                             Anesthesia Physical  Anesthesia Plan  ASA: II  Anesthesia Plan: General   Post-op Pain Management:    Induction:   PONV Risk Score and Plan: 3 and Ondansetron, Dexamethasone and Midazolam  Airway Management Planned: LMA  Additional Equipment:   Intra-op Plan:   Post-operative Plan: Extubation in OR  Informed Consent: I have reviewed the patients History and Physical, chart, labs and discussed the procedure including the risks, benefits and alternatives for the proposed anesthesia with the patient or authorized representative who has indicated his/her understanding and acceptance.   Dental advisory given  Plan Discussed with: CRNA  Anesthesia Plan Comments:         Anesthesia Quick Evaluation

## 2017-10-02 NOTE — Anesthesia Procedure Notes (Signed)
Procedure Name: Intubation Date/Time: 10/02/2017 12:35 PM Performed by: Signe Colt, CRNA Pre-anesthesia Checklist: Patient identified, Emergency Drugs available, Suction available and Patient being monitored Patient Re-evaluated:Patient Re-evaluated prior to induction Oxygen Delivery Method: Circle system utilized Preoxygenation: Pre-oxygenation with 100% oxygen Induction Type: IV induction Ventilation: Mask ventilation without difficulty Laryngoscope Size: Mac and 3 Grade View: Grade I Tube type: Oral Tube size: 7.0 mm Number of attempts: 1 Airway Equipment and Method: Stylet and Oral airway Placement Confirmation: ETT inserted through vocal cords under direct vision,  positive ETCO2 and breath sounds checked- equal and bilateral Secured at: 21 cm Tube secured with: Tape Dental Injury: Teeth and Oropharynx as per pre-operative assessment

## 2017-10-02 NOTE — Op Note (Signed)
Operative Note   DATE OF OPERATION: 11.26.18  LOCATION: [facility]-outpatient  SURGICAL DIVISION: Plastic Surgery  PREOPERATIVE DIAGNOSES:  1. History breast cancer 2. Acquired absence breasts 3. CHEK2 mutation  POSTOPERATIVE DIAGNOSES:  same  PROCEDURE:  Removal bilateral tissue expanders and placement silicone implants  SURGEON: Brinda Thimmappa MD [facility]  ASSISTANT: none  ANESTHESIA:  General.   EBL: 26 ml  COMPLICATIONS: None immediate.   INDICATIONS FOR PROCEDURE:  The patient, Olivia Frederick, is a 46 y.o. female born on 02/07/1971, is here for second stage reconstruction following bilateral nipple sparing mastectomies and immediate expander, ADM reconstruction.   FINDINGS: Full incorporation ADM bilateral. Natrelle Inspira Smooth Round Extra Projection 545 ml implant placed bilateral. REF [id number]  DESCRIPTION OF PROCEDURE:  The patient's operative site was marked with the patient in the preoperative area to mark sternal notch, anterior axillary lines, chest midline. The patientwas taken to the operating room. SCDs were placed and IV antibiotics were given. The patient's operative site was prepped and draped in a sterile fashion. A time out was performed and all information was confirmed to be correct.Patient demonstrated redundant soft tissue over lower poles. Nipple to IMF was marked symmetric bilateral at 8 cm and "smile" mastopexy designed. The area of skin marked for resection was deepithelialized. Incision made in right inframammary fold mastectomy scar. Incision carried through superficial fascia and acellular dermis. Expander removed. Examination of cavity with incorporated acellular dermis. Sizer placed. I then directed attention to left chest. Similaral "smile" mastopexy designed. The area of skin marked for resection was deepithelialized. Incision made through dermis and superficial fascia and acellular dermis incised. Expander  removed.Sizer placed and patient brought to sitting position. Inspira Smooth Round Extra Projection 545 ml implant chosen bilateral.   At this time breast cavities irrigated with solution containing Ancef, gentamicin, and bacitracin, followed by Betadine. Implant placed in left breast cavity. Care taken to ensure proper orientation. Closure was completed with running 3-0 vicryl for approximation of acellular dermis and superficial fascia. 4-0 vicryl was placed in dermis and running 4-0 monocryl was used to close skin. Over right breast, some debulking of soft tissue of medial lower mastectomy flap completed with excision subcutaneous fat in this area completed. Following implant placement, closure completed with running 3-0 vicryl to approximate ADM and superficial fascia. 4-0 vicryl was placed in dermis and running 4-0 monocryl was used to close skin.  Tissue adhesive applied to breast incisions. Dry dressing and breast binder applied.   The patient was allowed to wake from anesthesia, extubated and taken to the recovery room in satisfactory condition.   SPECIMENS: right and left mastectomy flap  DRAINS: none  Brinda Thimmappa, MD [facility] Plastic & Reconstructive Surgery [phone], pin [phone]  

## 2017-10-02 NOTE — Transfer of Care (Signed)
Immediate Anesthesia Transfer of Care Note  Patient: Olivia Frederick  Procedure(s) Performed: REMOVAL OF BILATERAL TISSUE EXPANDERS WITH PLACEMENT OF BILATERAL BREAST IMPLANTS (Bilateral Breast)  Patient Location: PACU  Anesthesia Type:General  Level of Consciousness: awake, alert , oriented and patient cooperative  Airway & Oxygen Therapy: Patient Spontanous Breathing and Patient connected to face mask oxygen  Post-op Assessment: Report given to RN and Post -op Vital signs reviewed and stable  Post vital signs: Reviewed and stable  Last Vitals:  Vitals:   10/02/17 1154  BP: 113/66  Pulse: 77  Resp: 18  Temp: 36.8 C  SpO2: 100%    Last Pain:  Vitals:   10/02/17 1154  TempSrc: Oral         Complications: No apparent anesthesia complications

## 2017-10-03 ENCOUNTER — Encounter (HOSPITAL_BASED_OUTPATIENT_CLINIC_OR_DEPARTMENT_OTHER): Payer: Self-pay | Admitting: Plastic Surgery

## 2017-10-03 NOTE — Anesthesia Postprocedure Evaluation (Signed)
Anesthesia Post Note  Patient: Olivia Frederick  Procedure(s) Performed: REMOVAL OF BILATERAL TISSUE EXPANDERS WITH PLACEMENT OF BILATERAL BREAST IMPLANTS (Bilateral Breast)     Patient location during evaluation: PACU Anesthesia Type: General Level of consciousness: awake and alert Pain management: pain level controlled Vital Signs Assessment: post-procedure vital signs reviewed and stable Respiratory status: spontaneous breathing, nonlabored ventilation, respiratory function stable and patient connected to nasal cannula oxygen Cardiovascular status: blood pressure returned to baseline and stable Postop Assessment: no apparent nausea or vomiting Anesthetic complications: no    Last Vitals:  Vitals:   10/02/17 1550 10/02/17 1618  BP:  124/74  Pulse: 66 73  Resp: 16 20  Temp:  36.6 C  SpO2: 96% 100%    Last Pain:  Vitals:   10/02/17 1618  TempSrc: Oral  PainSc: 2                  Bobbijo Holst P Jex Strausbaugh

## 2017-10-05 ENCOUNTER — Other Ambulatory Visit (HOSPITAL_BASED_OUTPATIENT_CLINIC_OR_DEPARTMENT_OTHER): Payer: 59

## 2017-10-05 ENCOUNTER — Ambulatory Visit (HOSPITAL_BASED_OUTPATIENT_CLINIC_OR_DEPARTMENT_OTHER): Payer: 59

## 2017-10-05 VITALS — BP 107/66 | HR 66 | Temp 98.8°F | Resp 18

## 2017-10-05 DIAGNOSIS — Z5112 Encounter for antineoplastic immunotherapy: Secondary | ICD-10-CM

## 2017-10-05 DIAGNOSIS — C50511 Malignant neoplasm of lower-outer quadrant of right female breast: Secondary | ICD-10-CM

## 2017-10-05 DIAGNOSIS — C50411 Malignant neoplasm of upper-outer quadrant of right female breast: Secondary | ICD-10-CM | POA: Diagnosis not present

## 2017-10-05 DIAGNOSIS — Z170 Estrogen receptor positive status [ER+]: Principal | ICD-10-CM

## 2017-10-05 LAB — COMPREHENSIVE METABOLIC PANEL
ALBUMIN: 3.7 g/dL (ref 3.5–5.0)
ALK PHOS: 44 U/L (ref 40–150)
ALT: 8 U/L (ref 0–55)
AST: 19 U/L (ref 5–34)
Anion Gap: 8 mEq/L (ref 3–11)
BILIRUBIN TOTAL: 0.28 mg/dL (ref 0.20–1.20)
BUN: 11.5 mg/dL (ref 7.0–26.0)
CALCIUM: 9.7 mg/dL (ref 8.4–10.4)
CO2: 26 meq/L (ref 22–29)
Chloride: 105 mEq/L (ref 98–109)
Creatinine: 1.1 mg/dL (ref 0.6–1.1)
EGFR: >60 mL/min/{1.73_m2} (ref >60)
Glucose: 104 mg/dl (ref 70–140)
Potassium: 4.8 mEq/L (ref 3.5–5.1)
SODIUM: 139 meq/L (ref 136–145)
Total Protein: 7.2 g/dL (ref 6.4–8.3)

## 2017-10-05 LAB — CBC WITH DIFFERENTIAL/PLATELET
BASO%: 0.4 % (ref 0.0–2.0)
BASOS ABS: 0 10*3/uL (ref 0.0–0.1)
EOS%: 2.1 % (ref 0.0–7.0)
Eosinophils Absolute: 0.1 10*3/uL (ref 0.0–0.5)
HEMATOCRIT: 29.2 % — AB (ref 34.8–46.6)
HGB: 10.1 g/dL — ABNORMAL LOW (ref 11.6–15.9)
LYMPH#: 1.7 10*3/uL (ref 0.9–3.3)
LYMPH%: 24.5 % (ref 14.0–49.7)
MCH: 25.9 pg (ref 25.1–34.0)
MCHC: 34.6 g/dL (ref 31.5–36.0)
MCV: 74.9 fL — AB (ref 79.5–101.0)
MONO#: 0.6 10*3/uL (ref 0.1–0.9)
MONO%: 9.2 % (ref 0.0–14.0)
NEUT#: 4.3 10*3/uL (ref 1.5–6.5)
NEUT%: 63.8 % (ref 38.4–76.8)
PLATELETS: 221 10*3/uL (ref 145–400)
RBC: 3.9 10*6/uL (ref 3.70–5.45)
RDW: 16.9 % — ABNORMAL HIGH (ref 11.2–14.5)
WBC: 6.7 10*3/uL (ref 3.9–10.3)

## 2017-10-05 MED ORDER — ACETAMINOPHEN 325 MG PO TABS
ORAL_TABLET | ORAL | Status: AC
  Filled 2017-10-05: qty 2

## 2017-10-05 MED ORDER — ACETAMINOPHEN 325 MG PO TABS
650.0000 mg | ORAL_TABLET | Freq: Once | ORAL | Status: AC
  Administered 2017-10-05: 650 mg via ORAL

## 2017-10-05 MED ORDER — DIPHENHYDRAMINE HCL 25 MG PO CAPS
ORAL_CAPSULE | ORAL | Status: AC
  Filled 2017-10-05: qty 1

## 2017-10-05 MED ORDER — HEPARIN SOD (PORK) LOCK FLUSH 100 UNIT/ML IV SOLN
500.0000 [IU] | Freq: Once | INTRAVENOUS | Status: AC | PRN
  Administered 2017-10-05: 500 [IU]
  Filled 2017-10-05: qty 5

## 2017-10-05 MED ORDER — TRASTUZUMAB CHEMO 150 MG IV SOLR
6.0000 mg/kg | Freq: Once | INTRAVENOUS | Status: AC
  Administered 2017-10-05: 420 mg via INTRAVENOUS
  Filled 2017-10-05: qty 20

## 2017-10-05 MED ORDER — SODIUM CHLORIDE 0.9% FLUSH
10.0000 mL | INTRAVENOUS | Status: DC | PRN
  Administered 2017-10-05: 10 mL
  Filled 2017-10-05: qty 10

## 2017-10-05 MED ORDER — DIPHENHYDRAMINE HCL 25 MG PO CAPS
25.0000 mg | ORAL_CAPSULE | Freq: Once | ORAL | Status: AC
  Administered 2017-10-05: 25 mg via ORAL

## 2017-10-05 MED ORDER — SODIUM CHLORIDE 0.9 % IV SOLN
Freq: Once | INTRAVENOUS | Status: AC
  Administered 2017-10-05: 15:00:00 via INTRAVENOUS

## 2017-10-05 NOTE — Patient Instructions (Signed)
[facility] Discharge Instructions for Patients Receiving Chemotherapy  Today you received the following chemotherapy agents Herceptin  To help prevent nausea and vomiting after your treatment, we encourage you to take your nausea medication as directed   If you develop nausea and vomiting that is not controlled by your nausea medication, call the clinic.   BELOW ARE SYMPTOMS THAT SHOULD BE REPORTED IMMEDIATELY:  *FEVER GREATER THAN 100.5 F  *CHILLS WITH OR WITHOUT FEVER  NAUSEA AND VOMITING THAT IS NOT CONTROLLED WITH YOUR NAUSEA MEDICATION  *UNUSUAL SHORTNESS OF BREATH  *UNUSUAL BRUISING OR BLEEDING  TENDERNESS IN MOUTH AND THROAT WITH OR WITHOUT PRESENCE OF ULCERS  *URINARY PROBLEMS  *BOWEL PROBLEMS  UNUSUAL RASH Items with * indicate a potential emergency and should be followed up as soon as possible.  Feel free to call the clinic should you have any questions or concerns. The clinic phone number is [phone].  Please show the CHEMO ALERT CARD at check-in to the Emergency Department and triage nurse.   

## 2017-10-11 ENCOUNTER — Telehealth: Payer: Self-pay | Admitting: Family Medicine

## 2017-10-11 NOTE — Telephone Encounter (Signed)
Copied from [place]. Topic: Quick Communication - See Telephone Encounter >> Oct 11, 2017  3:39 PM Antonieta Iba C wrote: CRM for notification. See Telephone encounter for: pt called in to transfer care. Pt says that Dr. Queen Slough her oncologist suggested either Dr. Sharlet Salina or Dr. Quay Burow due to pt being a breast cancer survivor. Pt would like to know if either provider would take her on.   Is this switch okay?   10/11/17.

## 2017-10-11 NOTE — Telephone Encounter (Signed)
Dr. Quay Burow or Dr. Sharlet Salina,  Would either of you be able to see this patient to establish care?

## 2017-10-11 NOTE — Telephone Encounter (Signed)
I can accept her

## 2017-10-13 NOTE — Telephone Encounter (Signed)
LM letting pt know that she is able to schedule to see Dr Quay Burow as a new patient. (30 minute slot)

## 2017-10-18 ENCOUNTER — Telehealth: Payer: Self-pay | Admitting: Adult Health

## 2017-10-18 NOTE — Telephone Encounter (Signed)
Scheduled appt per 12/7 sch message - sent reminder letter in the mail with appt date and time.

## 2017-10-26 ENCOUNTER — Other Ambulatory Visit (HOSPITAL_BASED_OUTPATIENT_CLINIC_OR_DEPARTMENT_OTHER): Payer: 59

## 2017-10-26 ENCOUNTER — Ambulatory Visit (HOSPITAL_BASED_OUTPATIENT_CLINIC_OR_DEPARTMENT_OTHER): Payer: 59

## 2017-10-26 VITALS — BP 132/59 | HR 74 | Temp 98.4°F | Resp 16

## 2017-10-26 DIAGNOSIS — C50411 Malignant neoplasm of upper-outer quadrant of right female breast: Secondary | ICD-10-CM

## 2017-10-26 DIAGNOSIS — Z5112 Encounter for antineoplastic immunotherapy: Secondary | ICD-10-CM

## 2017-10-26 DIAGNOSIS — Z170 Estrogen receptor positive status [ER+]: Principal | ICD-10-CM

## 2017-10-26 DIAGNOSIS — C50511 Malignant neoplasm of lower-outer quadrant of right female breast: Secondary | ICD-10-CM | POA: Diagnosis not present

## 2017-10-26 LAB — COMPREHENSIVE METABOLIC PANEL
ALT: 8 U/L (ref 0–55)
ANION GAP: 9 meq/L (ref 3–11)
AST: 13 U/L (ref 5–34)
Albumin: 4 g/dL (ref 3.5–5.0)
Alkaline Phosphatase: 49 U/L (ref 40–150)
BUN: 8.1 mg/dL (ref 7.0–26.0)
CO2: 26 meq/L (ref 22–29)
CREATININE: 0.8 mg/dL (ref 0.6–1.1)
Calcium: 9.4 mg/dL (ref 8.4–10.4)
Chloride: 106 mEq/L (ref 98–109)
EGFR: >60 mL/min/{1.73_m2} (ref >60)
Glucose: 91 mg/dl (ref 70–140)
Potassium: 4.3 mEq/L (ref 3.5–5.1)
Sodium: 141 mEq/L (ref 136–145)
Total Bilirubin: 0.31 mg/dL (ref 0.20–1.20)
Total Protein: 7.3 g/dL (ref 6.4–8.3)

## 2017-10-26 LAB — CBC WITH DIFFERENTIAL/PLATELET
BASO%: 0.5 % (ref 0.0–2.0)
BASOS ABS: 0 10*3/uL (ref 0.0–0.1)
EOS ABS: 0.1 10*3/uL (ref 0.0–0.5)
EOS%: 1.8 % (ref 0.0–7.0)
HEMATOCRIT: 31.9 % — AB (ref 34.8–46.6)
HEMOGLOBIN: 10.9 g/dL — AB (ref 11.6–15.9)
LYMPH#: 2.2 10*3/uL (ref 0.9–3.3)
LYMPH%: 31.1 % (ref 14.0–49.7)
MCH: 26.5 pg (ref 25.1–34.0)
MCHC: 34.2 g/dL (ref 31.5–36.0)
MCV: 77.4 fL — AB (ref 79.5–101.0)
MONO#: 0.8 10*3/uL (ref 0.1–0.9)
MONO%: 11.6 % (ref 0.0–14.0)
NEUT%: 55 % (ref 38.4–76.8)
NEUTROS ABS: 4 10*3/uL (ref 1.5–6.5)
PLATELETS: 220 10*3/uL (ref 145–400)
RBC: 4.12 10*6/uL (ref 3.70–5.45)
RDW: 17.4 % — AB (ref 11.2–14.5)
WBC: 7.2 10*3/uL (ref 3.9–10.3)

## 2017-10-26 MED ORDER — DIPHENHYDRAMINE HCL 25 MG PO CAPS
ORAL_CAPSULE | ORAL | Status: AC
  Filled 2017-10-26: qty 2

## 2017-10-26 MED ORDER — SODIUM CHLORIDE 0.9% FLUSH
10.0000 mL | INTRAVENOUS | Status: DC | PRN
  Administered 2017-10-26: 10 mL
  Filled 2017-10-26: qty 10

## 2017-10-26 MED ORDER — DIPHENHYDRAMINE HCL 25 MG PO CAPS
25.0000 mg | ORAL_CAPSULE | Freq: Once | ORAL | Status: AC
  Administered 2017-10-26: 25 mg via ORAL

## 2017-10-26 MED ORDER — HEPARIN SOD (PORK) LOCK FLUSH 100 UNIT/ML IV SOLN
500.0000 [IU] | Freq: Once | INTRAVENOUS | Status: AC | PRN
  Administered 2017-10-26: 500 [IU]
  Filled 2017-10-26: qty 5

## 2017-10-26 MED ORDER — SODIUM CHLORIDE 0.9 % IV SOLN
Freq: Once | INTRAVENOUS | Status: AC
  Administered 2017-10-26: 15:00:00 via INTRAVENOUS

## 2017-10-26 MED ORDER — ACETAMINOPHEN 325 MG PO TABS
ORAL_TABLET | ORAL | Status: AC
  Filled 2017-10-26: qty 2

## 2017-10-26 MED ORDER — ACETAMINOPHEN 325 MG PO TABS
650.0000 mg | ORAL_TABLET | Freq: Once | ORAL | Status: AC
  Administered 2017-10-26: 650 mg via ORAL

## 2017-10-26 MED ORDER — TRASTUZUMAB CHEMO 150 MG IV SOLR
6.0000 mg/kg | Freq: Once | INTRAVENOUS | Status: AC
  Administered 2017-10-26: 420 mg via INTRAVENOUS
  Filled 2017-10-26: qty 20

## 2017-10-26 NOTE — Patient Instructions (Signed)
[facility] Discharge Instructions for Patients Receiving Chemotherapy  Today you received the following chemotherapy agents:  Herceptin (trastuzumab)  To help prevent nausea and vomiting after your treatment, we encourage you to take your nausea medication as prescribed.   If you develop nausea and vomiting that is not controlled by your nausea medication, call the clinic.   BELOW ARE SYMPTOMS THAT SHOULD BE REPORTED IMMEDIATELY:  *FEVER GREATER THAN 100.5 F  *CHILLS WITH OR WITHOUT FEVER  NAUSEA AND VOMITING THAT IS NOT CONTROLLED WITH YOUR NAUSEA MEDICATION  *UNUSUAL SHORTNESS OF BREATH  *UNUSUAL BRUISING OR BLEEDING  TENDERNESS IN MOUTH AND THROAT WITH OR WITHOUT PRESENCE OF ULCERS  *URINARY PROBLEMS  *BOWEL PROBLEMS  UNUSUAL RASH Items with * indicate a potential emergency and should be followed up as soon as possible.  Feel free to call the clinic should you have any questions or concerns. The clinic phone number is [phone].  Please show the CHEMO ALERT CARD at check-in to the Emergency Department and triage nurse.   

## 2017-11-16 ENCOUNTER — Inpatient Hospital Stay: Payer: 59

## 2017-11-16 ENCOUNTER — Inpatient Hospital Stay: Payer: 59 | Attending: Oncology

## 2017-11-16 ENCOUNTER — Encounter: Payer: Self-pay | Admitting: Adult Health

## 2017-11-16 ENCOUNTER — Inpatient Hospital Stay (HOSPITAL_BASED_OUTPATIENT_CLINIC_OR_DEPARTMENT_OTHER): Payer: 59 | Admitting: Adult Health

## 2017-11-16 VITALS — BP 114/72 | HR 72 | Temp 98.4°F | Resp 20 | Wt 142.8 lb

## 2017-11-16 VITALS — Ht 66.0 in | Wt 142.0 lb

## 2017-11-16 DIAGNOSIS — Z9013 Acquired absence of bilateral breasts and nipples: Secondary | ICD-10-CM | POA: Diagnosis not present

## 2017-11-16 DIAGNOSIS — Z806 Family history of leukemia: Secondary | ICD-10-CM | POA: Diagnosis not present

## 2017-11-16 DIAGNOSIS — Z87891 Personal history of nicotine dependence: Secondary | ICD-10-CM | POA: Insufficient documentation

## 2017-11-16 DIAGNOSIS — C50311 Malignant neoplasm of lower-inner quadrant of right female breast: Secondary | ICD-10-CM | POA: Insufficient documentation

## 2017-11-16 DIAGNOSIS — C50411 Malignant neoplasm of upper-outer quadrant of right female breast: Secondary | ICD-10-CM | POA: Diagnosis not present

## 2017-11-16 DIAGNOSIS — Z801 Family history of malignant neoplasm of trachea, bronchus and lung: Secondary | ICD-10-CM | POA: Insufficient documentation

## 2017-11-16 DIAGNOSIS — Z9221 Personal history of antineoplastic chemotherapy: Secondary | ICD-10-CM

## 2017-11-16 DIAGNOSIS — Z808 Family history of malignant neoplasm of other organs or systems: Secondary | ICD-10-CM

## 2017-11-16 DIAGNOSIS — Z170 Estrogen receptor positive status [ER+]: Secondary | ICD-10-CM | POA: Diagnosis not present

## 2017-11-16 DIAGNOSIS — Z803 Family history of malignant neoplasm of breast: Secondary | ICD-10-CM

## 2017-11-16 DIAGNOSIS — Z5112 Encounter for antineoplastic immunotherapy: Secondary | ICD-10-CM | POA: Insufficient documentation

## 2017-11-16 DIAGNOSIS — Z79810 Long term (current) use of selective estrogen receptor modulators (SERMs): Secondary | ICD-10-CM | POA: Diagnosis not present

## 2017-11-16 LAB — CBC WITH DIFFERENTIAL/PLATELET
Basophils Absolute: 0 10*3/uL (ref 0.0–0.1)
Basophils Relative: 0 %
Eosinophils Absolute: 0.1 10*3/uL (ref 0.0–0.5)
Eosinophils Relative: 1 %
HCT: 32.2 % — ABNORMAL LOW (ref 34.8–46.6)
HEMOGLOBIN: 11.3 g/dL — AB (ref 11.6–15.9)
LYMPHS ABS: 2.6 10*3/uL (ref 0.9–3.3)
LYMPHS PCT: 39 %
MCH: 27.7 pg (ref 25.1–34.0)
MCHC: 35.1 g/dL (ref 31.5–36.0)
MCV: 78.9 fL — ABNORMAL LOW (ref 79.5–101.0)
MONOS PCT: 8 %
Monocytes Absolute: 0.5 10*3/uL (ref 0.1–0.9)
NEUTROS PCT: 52 %
Neutro Abs: 3.5 10*3/uL (ref 1.5–6.5)
Platelets: 257 10*3/uL (ref 145–400)
RBC: 4.08 MIL/uL (ref 3.70–5.45)
RDW: 15.7 % (ref 11.2–16.1)
WBC: 6.7 10*3/uL (ref 3.9–10.3)

## 2017-11-16 LAB — COMPREHENSIVE METABOLIC PANEL
ALK PHOS: 47 U/L (ref 40–150)
ALT: 8 U/L (ref 0–55)
ANION GAP: 9 (ref 3–11)
AST: 13 U/L (ref 5–34)
Albumin: 4.1 g/dL (ref 3.5–5.0)
BUN: 11 mg/dL (ref 7–26)
CALCIUM: 9.5 mg/dL (ref 8.4–10.4)
CO2: 27 mmol/L (ref 22–29)
CREATININE: 0.93 mg/dL (ref 0.60–1.10)
Chloride: 105 mmol/L (ref 98–109)
Glucose, Bld: 83 mg/dL (ref 70–140)
Potassium: 4.4 mmol/L (ref 3.3–4.7)
Sodium: 141 mmol/L (ref 136–145)
TOTAL PROTEIN: 7.6 g/dL (ref 6.4–8.3)
Total Bilirubin: 0.4 mg/dL (ref 0.2–1.2)

## 2017-11-16 MED ORDER — DIPHENHYDRAMINE HCL 25 MG PO CAPS
25.0000 mg | ORAL_CAPSULE | Freq: Once | ORAL | Status: AC
  Administered 2017-11-16: 25 mg via ORAL

## 2017-11-16 MED ORDER — ACETAMINOPHEN 325 MG PO TABS
650.0000 mg | ORAL_TABLET | Freq: Once | ORAL | Status: AC
  Administered 2017-11-16: 650 mg via ORAL

## 2017-11-16 MED ORDER — DIPHENHYDRAMINE HCL 25 MG PO CAPS
ORAL_CAPSULE | ORAL | Status: AC
  Filled 2017-11-16: qty 1

## 2017-11-16 MED ORDER — SODIUM CHLORIDE 0.9% FLUSH
10.0000 mL | INTRAVENOUS | Status: DC | PRN
  Administered 2017-11-16: 10 mL via INTRAVENOUS
  Filled 2017-11-16: qty 10

## 2017-11-16 MED ORDER — SODIUM CHLORIDE 0.9% FLUSH
10.0000 mL | INTRAVENOUS | Status: DC | PRN
  Administered 2017-11-16: 10 mL
  Filled 2017-11-16: qty 10

## 2017-11-16 MED ORDER — ACETAMINOPHEN 325 MG PO TABS
ORAL_TABLET | ORAL | Status: AC
  Filled 2017-11-16: qty 2

## 2017-11-16 MED ORDER — HEPARIN SOD (PORK) LOCK FLUSH 100 UNIT/ML IV SOLN
500.0000 [IU] | Freq: Once | INTRAVENOUS | Status: AC | PRN
  Administered 2017-11-16: 500 [IU]
  Filled 2017-11-16: qty 5

## 2017-11-16 MED ORDER — TRASTUZUMAB CHEMO 150 MG IV SOLR
6.0000 mg/kg | Freq: Once | INTRAVENOUS | Status: AC
  Administered 2017-11-16: 420 mg via INTRAVENOUS
  Filled 2017-11-16: qty 20

## 2017-11-16 MED ORDER — SODIUM CHLORIDE 0.9 % IV SOLN
Freq: Once | INTRAVENOUS | Status: AC
  Administered 2017-11-16: 14:00:00 via INTRAVENOUS

## 2017-11-16 NOTE — Patient Instructions (Signed)
[facility] Discharge Instructions for Patients Receiving Chemotherapy  Today you received the following chemotherapy agents: trastuzumab (Herceptin).  To help prevent nausea and vomiting after your treatment, we encourage you to take your nausea medication as prescribed.  If you develop nausea and vomiting that is not controlled by your nausea medication, call the clinic.   BELOW ARE SYMPTOMS THAT SHOULD BE REPORTED IMMEDIATELY:  *FEVER GREATER THAN 100.5 F  *CHILLS WITH OR WITHOUT FEVER  NAUSEA AND VOMITING THAT IS NOT CONTROLLED WITH YOUR NAUSEA MEDICATION  *UNUSUAL SHORTNESS OF BREATH  *UNUSUAL BRUISING OR BLEEDING  TENDERNESS IN MOUTH AND THROAT WITH OR WITHOUT PRESENCE OF ULCERS  *URINARY PROBLEMS  *BOWEL PROBLEMS  UNUSUAL RASH Items with * indicate a potential emergency and should be followed up as soon as possible.  Feel free to call the clinic should you have any questions or concerns. The clinic phone number is [phone].  Please show the CHEMO ALERT CARD at check-in to the Emergency Department and triage nurse.   

## 2017-11-16 NOTE — Progress Notes (Signed)
[name]  [phone] [phone]     ID: Olivia Frederick DOB: 1971/09/29  MR#: [id number]  WSF#:[id number]  Patient Care Team: Olivia Frederick, Olivia Apa, DO as PCP - General (Family Medicine) Olivia Seltzer, MD as Consulting Physician (General Surgery) Olivia Frederick, Olivia Dad, MD as Consulting Physician (Oncology) Olivia Pray, MD as Consulting Physician (Radiation Oncology) Olivia Craver, MD as Consulting Physician (Gastroenterology) Olivia Frederick, Olivia Massed, NP as Nurse Practitioner (Hematology and Oncology) Olivia Dock, NP OTHER MD:  CHIEF COMPLAINT: Triple positive breast cancer  CURRENT TREATMENT: Finished surgery   BREAST CANCER HISTORY: From the original intake note:  Olivia Frederick noted a change in her right breast around November 2017. She was not very concerned about it but did mention it at the time of mammography which had been scheduled for 01/30/2017. Accordingly she had bilateral diagnostic mammography with tomography and right breast ultrasonography that day at the [facility]. In the right breast there was an irregular mass measuring approximately 5 cm. On exam this was firm and palpable at the 12:00 location 2 cm from the nipple targeted ultrasonography showed an area of mixed echogenicity superiorly measuring at least 4.6 cm with a similar appearing adjacent area measuring 1.3 cm thought to be contiguous ultrasound of the right axilla showed one ymph node with cortical thickening.  Biopsy of the right breast mass 01/31/2017 showed ([id number]) invasive ductal carcinoma, grade 2, estrogen receptor 100% positive, with strong staining intensity, progesterone receptor 25% positive, with moderate staining intensity, with an MIB-1 of 25%, and HER-2 amplified with a signals ratio of 2.35, the number per cell being 4.0. Biopsy of the suspicious axillary lymph node showed only reactive  Her subsequent history is as detailed  below  INTERVAL HISTORY: Olivia Frederick is doing well today.  She is here for evaluation prior to starting Trastuzumab.  She is tolerating this every three weeks without difficulty.  She is due for her echo after this treatment.  She is taking Tamoxifen daily and is also tolerating this well.    REVIEW OF SYSTEMS: Olivia Frederick is doing well today.  She denies any issues such as arthralgias, nausea, vomiting, chest pain, swelling, hot flashes, or any other concerns.  A detailed ROS is non contributory.    PAST MEDICAL HISTORY: Past Medical History:  Diagnosis Date  . Genetic testing 04/24/2017   [name] underwent genetic counseling and testing for hereditary cancer syndromes on 03/08/2017. 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   The remaining 45 genes analyzed by [facility]'s 46-gene Common Hereditary Cancers Panel were negative for mutations. Genes analyzed include: APC, ATM, AXIN2, BARD1, BMPR1A, BRCA1, BRCA2, BRIP1, CDH1, CDKN2  . History of breast cancer 2018   right  . History of chemotherapy    finished chemo 05/2017    PAST SURGICAL HISTORY: Past Surgical History:  Procedure Laterality Date  . BREAST RECONSTRUCTION WITH PLACEMENT OF TISSUE EXPANDER AND FLEX HD (ACELLULAR HYDRATED DERMIS) Bilateral 06/27/2017   Procedure: BILATERAL BREAST RECONSTRUCTION WITH PLACEMENT OF TISSUE EXPANDER AND ALLODERM;  Surgeon: Olivia Limbo, MD;  Location: [facility];  Service: Plastics;  Laterality: Bilateral;  . CESAREAN SECTION     x 2  . NIPPLE SPARING MASTECTOMY/SENTINAL LYMPH NODE BIOPSY/RECONSTRUCTION/PLACEMENT OF TISSUE EXPANDER Bilateral 06/27/2017   Procedure: BILATERAL NIPPLE SPARING MASTECTOMY WITH RIGHT SENTINAL LYMPH NODE BIOPSY;  Surgeon: Olivia Seltzer, MD;  Location: [facility];  Service: General;  Laterality: Bilateral;  . REMOVAL OF BILATERAL TISSUE  EXPANDERS WITH PLACEMENT OF BILATERAL BREAST IMPLANTS  Bilateral 10/02/2017   Procedure: REMOVAL OF BILATERAL TISSUE EXPANDERS WITH PLACEMENT OF BILATERAL BREAST IMPLANTS;  Surgeon: Olivia Limbo, MD;  Location: [facility];  Service: Plastics;  Laterality: Bilateral;    FAMILY HISTORY Family History  Problem Relation Age of Onset  . Hypertension Mother   . Diabetes Father   . Leukemia Paternal Grandfather        d.64  . Breast cancer Cousin 40       Bilateral breast cancer. Daughter of paternal uncle with leukemia.  . Leukemia Paternal Uncle 45  . Lung cancer Maternal Uncle 74  . Brain cancer Maternal Aunt 65       d.66  . Cancer Maternal Uncle        unspecified type  . Brain cancer Cousin 91       d.55 daughter of maternal aunt with brain cancer  The patient's parents are living, both in their early 63s as of April 2018. She has one brother and one sister. On the paternal side a grandfather was diagnosed with leukemia at age 42, an uncle also with leukemia at age 19, another uncle at age 67 with colon cancer, and a cousin with breast cancer at age 52. On the mother's side there was an uncle with cancer of some type, not known to the patient, diagnosed age 63   GYNECOLOGIC HISTORY:  No LMP recorded. Patient is not currently having periods (Reason: Chemotherapy).  Menarche age 23. She is GX P2, first live birth age 86. As of April 2018 she is having regular periods. She never used oral contraceptives.    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 Son Olivia Frederick is 77, daughter Olivia Frederick is 51, as of April 2018.    ADVANCED DIRECTIVES: Not in place    HEALTH MAINTENANCE: Social History   Tobacco Use  . Smoking status: Former Smoker    Last attempt to quit: 02/01/2017    Years since quitting: 0.7  . Smokeless tobacco: Never Used  Substance Use Topics  . Alcohol use: Yes    Comment: 6 drinks/week  . Drug use: No     Colonoscopy: n/a  PAP:  Bone density:   No Known  Allergies  Current Outpatient Medications  Medication Sig Dispense Refill  . tamoxifen (NOLVADEX) 20 MG tablet Take 20 mg by mouth daily.      No current facility-administered medications for this visit.    Facility-Administered Medications Ordered in Other Visits  Medication Dose Route Frequency Provider Last Rate Last Dose  . sodium chloride flush (NS) 0.9 % injection 10 mL  10 mL Intravenous PRN Olivia Frederick, Olivia Dad, MD   10 mL at 11/16/17 1248    OBJECTIVE:  Vitals:   11/16/17 1319  BP: 114/72  Pulse: 72  Resp: 20  Temp: 98.4 F (36.9 C)  SpO2: 100%     Body mass index is 23.05 kg/m.    Filed Weights   11/16/17 1319  Weight: 142 lb 12.8 oz (64.8 kg)     ECOG FS:1 - Symptomatic but completely ambulatory  GENERAL: Patient is a well appearing female in no acute distress HEENT:  Sclerae anicteric.  Oropharynx clear and moist. No ulcerations or evidence of oropharyngeal candidiasis. Neck is supple.  NODES:  No cervical, supraclavicular, or axillary lymphadenopathy palpated.  BREAST EXAM:  Deferred. LUNGS:  Clear to auscultation bilaterally.  No wheezes or rhonchi. HEART:  Regular  rate and rhythm. No murmur appreciated. ABDOMEN:  Soft, nontender.  Positive, normoactive bowel sounds. No organomegaly palpated. MSK:  No focal spinal tenderness to palpation. Full range of motion bilaterally in the upper extremities. EXTREMITIES:  No peripheral edema.   SKIN:  Clear with no obvious rashes or skin changes. No nail dyscrasia. NEURO:  Nonfocal. Well oriented.  Appropriate affect.    LAB RESULTS:  CMP     Component Value Date/Time   NA 141 10/26/2017 1442   K 4.3 10/26/2017 1442   CO2 26 10/26/2017 1442   GLUCOSE 91 10/26/2017 1442   BUN 8.1 10/26/2017 1442   CREATININE 0.8 10/26/2017 1442   CALCIUM 9.4 10/26/2017 1442   PROT 7.3 10/26/2017 1442   ALBUMIN 4.0 10/26/2017 1442   AST 13 10/26/2017 1442   ALT 8 10/26/2017 1442   ALKPHOS 49 10/26/2017 1442   BILITOT 0.31  10/26/2017 1442    No results found for: TOTALPROTELP, ALBUMINELP, A1GS, A2GS, BETS, BETA2SER, GAMS, MSPIKE, SPEI  No results found for: KPAFRELGTCHN, LAMBDASER, KAPLAMBRATIO  Lab Results  Component Value Date   WBC 7.2 10/26/2017   NEUTROABS 4.0 10/26/2017   HGB 10.9 (L) 10/26/2017   HCT 31.9 (L) 10/26/2017   MCV 77.4 (L) 10/26/2017   PLT 220 10/26/2017      Chemistry      Component Value Date/Time   NA 141 10/26/2017 1442   K 4.3 10/26/2017 1442   CO2 26 10/26/2017 1442   BUN 8.1 10/26/2017 1442   CREATININE 0.8 10/26/2017 1442      Component Value Date/Time   CALCIUM 9.4 10/26/2017 1442   ALKPHOS 49 10/26/2017 1442   AST 13 10/26/2017 1442   ALT 8 10/26/2017 1442   BILITOT 0.31 10/26/2017 1442       No results found for: LABCA2  No components found for: [id number]  No results for input(s): INR in the last 168 hours.  Urinalysis    Component Value Date/Time   COLORURINE yellow 01/22/2010 1457   APPEARANCEUR Clear 01/22/2010 1457   LABSPEC 1.015 01/22/2010 1457   PHURINE 5.0 01/22/2010 1457   GLUCOSEU NEGATIVE 05/24/2007 1404   HGBUR trace-lysed 01/22/2010 1457   BILIRUBINUR negative 01/22/2010 [place] 05/24/2007 1404   PROTEINUR NEGATIVE 05/24/2007 1404   UROBILINOGEN 0.2 01/22/2010 1457   NITRITE negative 01/22/2010 1457   LEUKOCYTESUR  05/24/2007 1404    NEGATIVE MICROSCOPIC NOT DONE ON URINES WITH NEGATIVE PROTEIN, BLOOD, LEUKOCYTES, NITRITE, OR GLUCOSE <1000 mg/dL.     STUDIES: No results found.   ELIGIBLE FOR AVAILABLE RESEARCH PROTOCOL: No  ASSESSMENT: 47 y.o. CHEK2 positive [place] woman status post right breast biopsy 02/01/2017 for a cT2 pN0, stage IB invasive ductal carcinoma, grade 2, estrogen and progesterone receptor positive, with an MIB-1 of 25%, and HER-2 amplified   (a) biopsy of a suspicious right axillary lymph node 01/24/2017 showed only reactive lymphoid tissue  (b) biopsy of right breast lower outer  quadrant 02/17/2017 showed invasive ductal carcinoma, grade 3, estrogen and progesterone receptor positive, HER-2 not amplified, with an Mib-1 of 50%  (1) genetics testing 46-gene Common Hereditary Cancer Panel offered by [facility] identified a single, heterozygous pathogenic gene mutation called CHEK2, c.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 There were no additional mutations in the remaining 45 genes analyzed: APC, ATM, AXIN2, BARD1, BMPR1A, BRCA1, BRCA2, BRIP1, CDH1, CDKN2A, CTNNA1, DICER1, EPCAM, GREM1, HOXB13, KIT, MEN1, MLH1, MSH2, MSH3, MSH6, MUTYH, NBN, NF1, NTHL1, PALB2, PDGFRA, PMS2, POLD1, POLE, PTEN, RAD50, RAD51C, RAD51D, SDHA, SDHB,  SDHC, SDHD, SMAD4, SMARCA4, STK11, TP53, TSC1, TSC2, and VHL.  (a) will need either bilateral mastectomies or intensified screening  (b) colon cancer screening to begin at age 12 and repeated every 5 years (10% lifetime risk).  (2) neoadjuvant chemotherapy with carboplatin, docetaxel, and trastuzumab with pertuzumab given every 3 weeks 6, starting 02/16/2017, completed 06/01/2017  (3) anti-HER-2 immunotherapy to continue to total 1 year  (a) baseline echocardiogram 02/13/2017 found an ejection fraction of 55-60%  (b) echocardiogram 05/23/2017 found an ejection fraction of 55-60%.  (c) echo on 08/18/2017 demonstrates LVEF of 60-65%  (4) status post bilateral nipple sparing mastectomies with right sentinel lymph node sampling 06/27/2017, showing  (a) on the left, no evidence of malignancy  (b) on the right, residual mpT2 pN0 invasive ductal carcinoma, grade 2, with ample margins  (5) Offered adjuvant radiation by Dr. Sondra Come, however after thoughtful discussion, patient declined  (6) Tamoxifen daily starting in 07/2017   PLAN: Deriona is doing well today.  Her labs are not yet back.  She is tolerating her adjuvant Trastuzumab and Tamoxfien daily.  She will proceed with the Trastuzumab today. She will continue taking Tamoxifen daily.  I ordered her next echo as it is  due.  I also requested her f/u appointment with Dr. Jana Hakim.  She will continue to return every 3 weeks for Trastuzumab until she reaches one year of therapy which will be in 02/2018.    The above was reviewed with [facility] in detail.  She verbalizes understanding of the above plan and is in agreement.  She knows to call for any questions or concerns prior to her next appointment with [place].    A total of (30) minutes of face-to-face time was spent with this patient with greater than 50% of that time in counseling and care-coordination.  Olivia Dock, NP   11/16/2017 1:20 PM Medical Oncology and Hematology [facility] [address] [place], [place] [address] Tel. [phone]    Fax. [phone]

## 2017-11-17 ENCOUNTER — Telehealth: Payer: Self-pay | Admitting: Adult Health

## 2017-11-17 NOTE — Telephone Encounter (Signed)
Scheduled appt per 1/10 los - Patient to get an updated schedule next visit.  

## 2017-11-27 ENCOUNTER — Ambulatory Visit (HOSPITAL_COMMUNITY)
Admission: RE | Admit: 2017-11-27 | Discharge: 2017-11-27 | Disposition: A | Discharge status: Home / Self Care | Payer: 59 | Source: Ambulatory Visit | Attending: Adult Health | Admitting: Adult Health

## 2017-11-27 DIAGNOSIS — Z170 Estrogen receptor positive status [ER+]: Secondary | ICD-10-CM

## 2017-11-27 DIAGNOSIS — C50411 Malignant neoplasm of upper-outer quadrant of right female breast: Secondary | ICD-10-CM | POA: Diagnosis not present

## 2017-11-27 NOTE — Progress Notes (Signed)
  Echocardiogram 2D Echocardiogram has been performed.  Olivia Frederick F 11/27/2017, 2:37 PM

## 2017-12-07 ENCOUNTER — Inpatient Hospital Stay: Payer: 59

## 2017-12-07 VITALS — BP 112/67 | HR 70 | Temp 98.7°F | Resp 19

## 2017-12-07 DIAGNOSIS — C50411 Malignant neoplasm of upper-outer quadrant of right female breast: Secondary | ICD-10-CM

## 2017-12-07 DIAGNOSIS — Z170 Estrogen receptor positive status [ER+]: Principal | ICD-10-CM

## 2017-12-07 DIAGNOSIS — Z5112 Encounter for antineoplastic immunotherapy: Secondary | ICD-10-CM | POA: Diagnosis not present

## 2017-12-07 LAB — CBC WITH DIFFERENTIAL/PLATELET
BASOS ABS: 0 10*3/uL (ref 0.0–0.1)
Basophils Relative: 0 %
Eosinophils Absolute: 0.1 10*3/uL (ref 0.0–0.5)
Eosinophils Relative: 1 %
HEMATOCRIT: 31.9 % — AB (ref 34.8–46.6)
Hemoglobin: 11.2 g/dL — ABNORMAL LOW (ref 11.6–15.9)
LYMPHS PCT: 26 %
Lymphs Abs: 1.8 10*3/uL (ref 0.9–3.3)
MCH: 27.9 pg (ref 25.1–34.0)
MCHC: 35.1 g/dL (ref 31.5–36.0)
MCV: 79.6 fL (ref 79.5–101.0)
MONO ABS: 0.7 10*3/uL (ref 0.1–0.9)
Monocytes Relative: 10 %
Neutro Abs: 4.5 10*3/uL (ref 1.5–6.5)
Neutrophils Relative %: 63 %
Platelets: 227 10*3/uL (ref 145–400)
RBC: 4.01 MIL/uL (ref 3.70–5.45)
RDW: 14.6 % — AB (ref 11.2–14.5)
WBC: 7.1 10*3/uL (ref 3.9–10.3)

## 2017-12-07 LAB — COMPREHENSIVE METABOLIC PANEL
ALT: 6 U/L (ref 0–55)
AST: 13 U/L (ref 5–34)
Albumin: 3.8 g/dL (ref 3.5–5.0)
Alkaline Phosphatase: 49 U/L (ref 40–150)
Anion gap: 8 (ref 3–11)
BILIRUBIN TOTAL: 0.3 mg/dL (ref 0.2–1.2)
BUN: 9 mg/dL (ref 7–26)
CO2: 29 mmol/L (ref 22–29)
Calcium: 9.5 mg/dL (ref 8.4–10.4)
Chloride: 104 mmol/L (ref 98–109)
Creatinine, Ser: 0.92 mg/dL (ref 0.60–1.10)
GFR calc Af Amer: >60 mL/min (ref >60)
Glucose, Bld: 100 mg/dL (ref 70–140)
Potassium: 4.3 mmol/L (ref 3.5–5.1)
Sodium: 141 mmol/L (ref 136–145)
TOTAL PROTEIN: 7.3 g/dL (ref 6.4–8.3)

## 2017-12-07 MED ORDER — SODIUM CHLORIDE 0.9% FLUSH
10.0000 mL | INTRAVENOUS | Status: DC | PRN
  Administered 2017-12-07: 10 mL
  Filled 2017-12-07: qty 10

## 2017-12-07 MED ORDER — SODIUM CHLORIDE 0.9 % IV SOLN
6.0000 mg/kg | Freq: Once | INTRAVENOUS | Status: AC
  Administered 2017-12-07: 420 mg via INTRAVENOUS
  Filled 2017-12-07: qty 20

## 2017-12-07 MED ORDER — ACETAMINOPHEN 325 MG PO TABS
650.0000 mg | ORAL_TABLET | Freq: Once | ORAL | Status: AC
  Administered 2017-12-07: 650 mg via ORAL

## 2017-12-07 MED ORDER — SODIUM CHLORIDE 0.9 % IV SOLN
Freq: Once | INTRAVENOUS | Status: AC
  Administered 2017-12-07: 15:00:00 via INTRAVENOUS

## 2017-12-07 MED ORDER — DIPHENHYDRAMINE HCL 25 MG PO CAPS
25.0000 mg | ORAL_CAPSULE | Freq: Once | ORAL | Status: AC
  Administered 2017-12-07: 25 mg via ORAL

## 2017-12-07 MED ORDER — DIPHENHYDRAMINE HCL 25 MG PO CAPS
ORAL_CAPSULE | ORAL | Status: AC
  Filled 2017-12-07: qty 1

## 2017-12-07 MED ORDER — ACETAMINOPHEN 325 MG PO TABS
ORAL_TABLET | ORAL | Status: AC
  Filled 2017-12-07: qty 2

## 2017-12-07 MED ORDER — HEPARIN SOD (PORK) LOCK FLUSH 100 UNIT/ML IV SOLN
500.0000 [IU] | Freq: Once | INTRAVENOUS | Status: AC | PRN
  Administered 2017-12-07: 500 [IU]
  Filled 2017-12-07: qty 5

## 2017-12-07 NOTE — Patient Instructions (Signed)
[facility] Discharge Instructions for Patients Receiving Chemotherapy  Today you received the following chemotherapy agents Herceptin  To help prevent nausea and vomiting after your treatment, we encourage you to take your nausea medication as directed   If you develop nausea and vomiting that is not controlled by your nausea medication, call the clinic.   BELOW ARE SYMPTOMS THAT SHOULD BE REPORTED IMMEDIATELY:  *FEVER GREATER THAN 100.5 F  *CHILLS WITH OR WITHOUT FEVER  NAUSEA AND VOMITING THAT IS NOT CONTROLLED WITH YOUR NAUSEA MEDICATION  *UNUSUAL SHORTNESS OF BREATH  *UNUSUAL BRUISING OR BLEEDING  TENDERNESS IN MOUTH AND THROAT WITH OR WITHOUT PRESENCE OF ULCERS  *URINARY PROBLEMS  *BOWEL PROBLEMS  UNUSUAL RASH Items with * indicate a potential emergency and should be followed up as soon as possible.  Feel free to call the clinic should you have any questions or concerns. The clinic phone number is [phone].  Please show the CHEMO ALERT CARD at check-in to the Emergency Department and triage nurse.   

## 2017-12-28 ENCOUNTER — Inpatient Hospital Stay: Payer: 59 | Attending: Oncology

## 2017-12-28 ENCOUNTER — Inpatient Hospital Stay: Payer: 59

## 2017-12-28 VITALS — BP 117/65 | HR 71 | Temp 98.2°F | Resp 18 | Wt 142.1 lb

## 2017-12-28 DIAGNOSIS — C50111 Malignant neoplasm of central portion of right female breast: Secondary | ICD-10-CM | POA: Insufficient documentation

## 2017-12-28 DIAGNOSIS — Z170 Estrogen receptor positive status [ER+]: Secondary | ICD-10-CM | POA: Diagnosis not present

## 2017-12-28 DIAGNOSIS — C50511 Malignant neoplasm of lower-outer quadrant of right female breast: Secondary | ICD-10-CM | POA: Insufficient documentation

## 2017-12-28 DIAGNOSIS — C50411 Malignant neoplasm of upper-outer quadrant of right female breast: Secondary | ICD-10-CM

## 2017-12-28 DIAGNOSIS — C50011 Malignant neoplasm of nipple and areola, right female breast: Secondary | ICD-10-CM | POA: Insufficient documentation

## 2017-12-28 DIAGNOSIS — Z5112 Encounter for antineoplastic immunotherapy: Secondary | ICD-10-CM | POA: Insufficient documentation

## 2017-12-28 DIAGNOSIS — C50311 Malignant neoplasm of lower-inner quadrant of right female breast: Secondary | ICD-10-CM | POA: Diagnosis present

## 2017-12-28 LAB — COMPREHENSIVE METABOLIC PANEL
ALT: 9 U/L (ref 0–55)
AST: 14 U/L (ref 5–34)
Albumin: 3.8 g/dL (ref 3.5–5.0)
Alkaline Phosphatase: 46 U/L (ref 40–150)
Anion gap: 8 (ref 3–11)
BILIRUBIN TOTAL: 0.3 mg/dL (ref 0.2–1.2)
BUN: 8 mg/dL (ref 7–26)
CHLORIDE: 104 mmol/L (ref 98–109)
CO2: 28 mmol/L (ref 22–29)
Calcium: 9.6 mg/dL (ref 8.4–10.4)
Creatinine, Ser: 0.9 mg/dL (ref 0.60–1.10)
GFR calc Af Amer: >60 mL/min (ref >60)
GFR calc non Af Amer: >60 mL/min (ref >60)
Glucose, Bld: 121 mg/dL (ref 70–140)
POTASSIUM: 4.5 mmol/L (ref 3.5–5.1)
Sodium: 140 mmol/L (ref 136–145)
TOTAL PROTEIN: 7.3 g/dL (ref 6.4–8.3)

## 2017-12-28 LAB — CBC WITH DIFFERENTIAL/PLATELET
Basophils Absolute: 0 10*3/uL (ref 0.0–0.1)
Basophils Relative: 1 %
EOS PCT: 1 %
Eosinophils Absolute: 0.1 10*3/uL (ref 0.0–0.5)
HCT: 33.4 % — ABNORMAL LOW (ref 34.8–46.6)
Hemoglobin: 11.5 g/dL — ABNORMAL LOW (ref 11.6–15.9)
LYMPHS ABS: 1.6 10*3/uL (ref 0.9–3.3)
LYMPHS PCT: 23 %
MCH: 28.2 pg (ref 25.1–34.0)
MCHC: 34.6 g/dL (ref 31.5–36.0)
MCV: 81.6 fL (ref 79.5–101.0)
MONOS PCT: 8 %
Monocytes Absolute: 0.5 10*3/uL (ref 0.1–0.9)
Neutro Abs: 4.6 10*3/uL (ref 1.5–6.5)
Neutrophils Relative %: 67 %
PLATELETS: 273 10*3/uL (ref 145–400)
RBC: 4.09 MIL/uL (ref 3.70–5.45)
RDW: 14.1 % (ref 11.2–14.5)
WBC: 6.8 10*3/uL (ref 3.9–10.3)

## 2017-12-28 MED ORDER — DIPHENHYDRAMINE HCL 25 MG PO CAPS
25.0000 mg | ORAL_CAPSULE | Freq: Once | ORAL | Status: AC
  Administered 2017-12-28: 25 mg via ORAL

## 2017-12-28 MED ORDER — SODIUM CHLORIDE 0.9 % IV SOLN
6.0000 mg/kg | Freq: Once | INTRAVENOUS | Status: AC
  Administered 2017-12-28: 420 mg via INTRAVENOUS
  Filled 2017-12-28: qty 20

## 2017-12-28 MED ORDER — SODIUM CHLORIDE 0.9 % IV SOLN
Freq: Once | INTRAVENOUS | Status: AC
  Administered 2017-12-28: 14:00:00 via INTRAVENOUS

## 2017-12-28 MED ORDER — HEPARIN SOD (PORK) LOCK FLUSH 100 UNIT/ML IV SOLN
500.0000 [IU] | Freq: Once | INTRAVENOUS | Status: AC | PRN
  Administered 2017-12-28: 500 [IU]
  Filled 2017-12-28: qty 5

## 2017-12-28 MED ORDER — ACETAMINOPHEN 325 MG PO TABS
650.0000 mg | ORAL_TABLET | Freq: Once | ORAL | Status: AC
  Administered 2017-12-28: 650 mg via ORAL

## 2017-12-28 MED ORDER — DIPHENHYDRAMINE HCL 25 MG PO CAPS
ORAL_CAPSULE | ORAL | Status: AC
  Filled 2017-12-28: qty 1

## 2017-12-28 MED ORDER — ACETAMINOPHEN 325 MG PO TABS
ORAL_TABLET | ORAL | Status: AC
  Filled 2017-12-28: qty 2

## 2017-12-28 MED ORDER — SODIUM CHLORIDE 0.9% FLUSH
10.0000 mL | INTRAVENOUS | Status: DC | PRN
  Administered 2017-12-28: 10 mL
  Filled 2017-12-28: qty 10

## 2017-12-28 NOTE — Patient Instructions (Signed)
[facility] Discharge Instructions for Patients Receiving Chemotherapy  Today you received the following chemotherapy agents Herceptin  To help prevent nausea and vomiting after your treatment, we encourage you to take your nausea medication as directed   If you develop nausea and vomiting that is not controlled by your nausea medication, call the clinic.   BELOW ARE SYMPTOMS THAT SHOULD BE REPORTED IMMEDIATELY:  *FEVER GREATER THAN 100.5 F  *CHILLS WITH OR WITHOUT FEVER  NAUSEA AND VOMITING THAT IS NOT CONTROLLED WITH YOUR NAUSEA MEDICATION  *UNUSUAL SHORTNESS OF BREATH  *UNUSUAL BRUISING OR BLEEDING  TENDERNESS IN MOUTH AND THROAT WITH OR WITHOUT PRESENCE OF ULCERS  *URINARY PROBLEMS  *BOWEL PROBLEMS  UNUSUAL RASH Items with * indicate a potential emergency and should be followed up as soon as possible.  Feel free to call the clinic should you have any questions or concerns. The clinic phone number is [phone].  Please show the CHEMO ALERT CARD at check-in to the Emergency Department and triage nurse.   

## 2018-01-16 NOTE — Progress Notes (Signed)
[place]  [phone] [phone]     ID: Olivia Frederick DOB: [birth year]  MR#: [id number]  NID#:[id number]  Patient Care Team: Carollee Herter, Alferd Apa, DO as PCP - General (Family Medicine) Excell Seltzer, MD as Consulting Physician (General Surgery) Magrinat, Virgie Dad, MD as Consulting Physician (Oncology) Gery Pray, MD as Consulting Physician (Radiation Oncology) Juanita Craver, MD as Consulting Physician (Gastroenterology) Delice Bison, Charlestine Massed, NP as Nurse Practitioner (Hematology and Oncology) OTHER MD:  CHIEF COMPLAINT: Triple positive breast cancer  CURRENT TREATMENT: tamoxifen, trastuzumab   BREAST CANCER HISTORY: From the original intake note:  Olivia Frederick noted a change in her right breast around November 2017. She was not very concerned about it but did mention it at the time of mammography which had been scheduled for 01/30/2017. Accordingly she had bilateral diagnostic mammography with tomography and right breast ultrasonography that day at the [facility]. In the right breast there was an irregular mass measuring approximately 5 cm. On exam this was firm and palpable at the 12:00 location 2 cm from the nipple targeted ultrasonography showed an area of mixed echogenicity superiorly measuring at least 4.6 cm with a similar appearing adjacent area measuring 1.3 cm thought to be contiguous ultrasound of the right axilla showed one ymph node with cortical thickening.  Biopsy of the right breast mass 01/31/2017 showed ([id number]) invasive ductal carcinoma, grade 2, estrogen receptor 100% positive, with strong staining intensity, progesterone receptor 25% positive, with moderate staining intensity, with an MIB-1 of 25%, and HER-2 amplified with a signals ratio of 2.35, the number per cell being 4.0. Biopsy of the suspicious axillary lymph node showed only reactive  Her subsequent history is as detailed below  INTERVAL  HISTORY: Olivia Frederick returns today for follow up and treatment of her estrogen receptor positive breast cancer. She receives trastuzumab every 21 days, with today being day 1 cycle 12. She tolerates this well.   Her most recent echo on 11/27/2017 showed an ejection fraction in the 55-60 % range  She also continues on tamoxifen, with good tolerance. She denies issues with hot flashes. She had increased vaginal discharge with some itchiness. She is planning to use Monistat.    REVIEW OF SYSTEMS: Olivia Frederick reports that her reconstruction went great. She has silicone implants. She is cosmetically pleased with her results, but she noticed that it feels different. She feels more comfortable at an inclined angle when she sleeps. There are no limitations in her activities, and she has good ROM. She had a colonoscopy at the end of 2018. Her doctor suggested that she return in 3 years just to be careful. She is having more arthritis in her joints, mainly in her back. Her hair is growing back. She is able to walk for exercise most days. She hasn't been able to go to the gym. She would like to have her port removed soon. Her periods are slightly irregular. Her last period was 12/24/2017 in which it was light with more spotting. She denies unusual headaches, visual changes, nausea, vomiting, or dizziness. There has been no unusual cough, phlegm production, or pleurisy. This been no change in bowel or bladder habits. She denies unexplained fatigue or unexplained weight loss, bleeding, rash, or fever. A detailed review of systems was otherwise stable.    PAST MEDICAL HISTORY: Past Medical History:  Diagnosis Date  . Genetic testing 04/24/2017   [name] underwent genetic counseling and testing for hereditary cancer syndromes on 03/08/2017. 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   The remaining 45 genes analyzed by [facility]'s 46-gene Common Hereditary Cancers Panel  were negative for mutations. Genes analyzed include: APC, ATM, AXIN2, BARD1, BMPR1A, BRCA1, BRCA2, BRIP1, CDH1, CDKN2  . History of breast cancer 2018   right  . History of chemotherapy    finished chemo 05/2017    PAST SURGICAL HISTORY: Past Surgical History:  Procedure Laterality Date  . BREAST RECONSTRUCTION WITH PLACEMENT OF TISSUE EXPANDER AND FLEX HD (ACELLULAR HYDRATED DERMIS) Bilateral 06/27/2017   Procedure: BILATERAL BREAST RECONSTRUCTION WITH PLACEMENT OF TISSUE EXPANDER AND ALLODERM;  Surgeon: Irene Limbo, MD;  Location: [facility];  Service: Plastics;  Laterality: Bilateral;  . CESAREAN SECTION     x 2  . NIPPLE SPARING MASTECTOMY/SENTINAL LYMPH NODE BIOPSY/RECONSTRUCTION/PLACEMENT OF TISSUE EXPANDER Bilateral 06/27/2017   Procedure: BILATERAL NIPPLE SPARING MASTECTOMY WITH RIGHT SENTINAL LYMPH NODE BIOPSY;  Surgeon: Excell Seltzer, MD;  Location: [facility];  Service: General;  Laterality: Bilateral;  . REMOVAL OF BILATERAL TISSUE EXPANDERS WITH PLACEMENT OF BILATERAL BREAST IMPLANTS Bilateral 10/02/2017   Procedure: REMOVAL OF BILATERAL TISSUE EXPANDERS WITH PLACEMENT OF BILATERAL BREAST IMPLANTS;  Surgeon: Irene Limbo, MD;  Location: [facility];  Service: Plastics;  Laterality: Bilateral;    FAMILY HISTORY Family History  Problem Relation Age of Onset  . Hypertension Mother   . Diabetes Father   . Leukemia Paternal Grandfather        d.64  . Breast cancer Cousin 40       Bilateral breast cancer. Daughter of paternal uncle with leukemia.  . Leukemia Paternal Uncle 54  . Lung cancer Maternal Uncle 74  . Brain cancer Maternal Aunt 65       d.66  . Cancer Maternal Uncle        unspecified type  . Brain cancer Cousin 23       d.55 daughter of maternal aunt with brain cancer  The patient's parents are living, both in their early 34s as of April 2018. She has one brother and one sister. On the paternal side a  grandfather was diagnosed with leukemia at age 28, an uncle also with leukemia at age 71, another uncle at age 14 with colon cancer, and a cousin with breast cancer at age 53. On the mother's side there was an uncle with cancer of some type, not known to the patient, diagnosed age 16   GYNECOLOGIC HISTORY:  No LMP recorded. Patient is not currently having periods (Reason: Chemotherapy).  Menarche age 44. She is GX P2, first live birth age 16.  Her periods were briefly interrupted with chemotherapy but resumed on January 2019 she never used oral contraceptives.    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 Son Olivia Frederick is 43, daughter Olivia Frederick is 41, as of April 2018.    ADVANCED DIRECTIVES: Not in place    HEALTH MAINTENANCE: Social History   Tobacco Use  . Smoking status: Former Smoker    Last attempt to quit: 02/01/2017    Years since quitting: 0.9  . Smokeless tobacco: Never Used  Substance Use Topics  . Alcohol use: Yes    Comment: 6 drinks/week  . Drug use: No     Colonoscopy: n/a  PAP:  Bone density:   No Known Allergies  Current Outpatient Medications  Medication Sig Dispense Refill  . tamoxifen (NOLVADEX) 20 MG tablet Take 20 mg by mouth daily.  No current facility-administered medications for this visit.    Facility-Administered Medications Ordered in Other Visits  Medication Dose Route Frequency Provider Last Rate Last Dose  . sodium chloride flush (NS) 0.9 % injection 10 mL  10 mL Intravenous PRN Magrinat, Virgie Dad, MD   10 mL at 11/16/17 1248    OBJECTIVE: Young white woman who appears well Vitals:   01/18/18 1508  BP: 124/72  Pulse: 79  Resp: 18  Temp: 98 F (36.7 C)  SpO2: 100%     Body mass index is 23.11 kg/m.    Filed Weights   01/18/18 1508  Weight: 143 lb 3.2 oz (65 kg)     ECOG FS:0 - Asymptomatic   Sclerae unicteric, EOMs intact Oropharynx clear and moist No cervical or supraclavicular adenopathy Lungs  no rales or rhonchi Heart regular rate and rhythm Abd soft, nontender, positive bowel sounds MSK no focal spinal tenderness, no upper extremity lymphedema Neuro: nonfocal, well oriented, appropriate affect Breasts: That is post bilateral nipple sparing mastectomies with bilateral silicone implants in place.  The cosmetic result is excellent.  There is no evidence of residual recurrent disease.  Both axillae are benign.    LAB RESULTS:  CMP     Component Value Date/Time   NA 138 01/18/2018 1457   NA 141 10/26/2017 1442   K 3.9 01/18/2018 1457   K 4.3 10/26/2017 1442   CL 105 01/18/2018 1457   CO2 26 01/18/2018 1457   CO2 26 10/26/2017 1442   GLUCOSE 112 01/18/2018 1457   GLUCOSE 91 10/26/2017 1442   BUN 12 01/18/2018 1457   BUN 8.1 10/26/2017 1442   CREATININE 0.92 01/18/2018 1457   CREATININE 0.8 10/26/2017 1442   CALCIUM 9.5 01/18/2018 1457   CALCIUM 9.4 10/26/2017 1442   PROT 7.4 01/18/2018 1457   PROT 7.3 10/26/2017 1442   ALBUMIN 3.8 01/18/2018 1457   ALBUMIN 4.0 10/26/2017 1442   AST 14 01/18/2018 1457   AST 13 10/26/2017 1442   ALT 10 01/18/2018 1457   ALT 8 10/26/2017 1442   ALKPHOS 47 01/18/2018 1457   ALKPHOS 49 10/26/2017 1442   BILITOT 0.4 01/18/2018 1457   BILITOT 0.31 10/26/2017 1442   GFRNONAA >60 01/18/2018 1457   GFRAA >60 01/18/2018 1457    No results found for: TOTALPROTELP, ALBUMINELP, A1GS, A2GS, BETS, BETA2SER, GAMS, MSPIKE, SPEI  No results found for: Nils Pyle, [facility]  Lab Results  Component Value Date   WBC 8.6 01/18/2018   NEUTROABS 5.2 01/18/2018   HGB 11.6 01/18/2018   HCT 33.7 (L) 01/18/2018   MCV 80.2 01/18/2018   PLT 255 01/18/2018      Chemistry      Component Value Date/Time   NA 138 01/18/2018 1457   NA 141 10/26/2017 1442   K 3.9 01/18/2018 1457   K 4.3 10/26/2017 1442   CL 105 01/18/2018 1457   CO2 26 01/18/2018 1457   CO2 26 10/26/2017 1442   BUN 12 01/18/2018 1457   BUN 8.1 10/26/2017 1442     CREATININE 0.92 01/18/2018 1457   CREATININE 0.8 10/26/2017 1442      Component Value Date/Time   CALCIUM 9.5 01/18/2018 1457   CALCIUM 9.4 10/26/2017 1442   ALKPHOS 47 01/18/2018 1457   ALKPHOS 49 10/26/2017 1442   AST 14 01/18/2018 1457   AST 13 10/26/2017 1442   ALT 10 01/18/2018 1457   ALT 8 10/26/2017 1442   BILITOT 0.4 01/18/2018 1457   BILITOT 0.31  10/26/2017 1442       No results found for: LABCA2  No components found for: [id number]  No results for input(s): INR in the last 168 hours.  Urinalysis    Component Value Date/Time   COLORURINE yellow 01/22/2010 1457   APPEARANCEUR Clear 01/22/2010 1457   LABSPEC 1.015 01/22/2010 1457   PHURINE 5.0 01/22/2010 1457   GLUCOSEU NEGATIVE 05/24/2007 1404   HGBUR trace-lysed 01/22/2010 1457   BILIRUBINUR negative 01/22/2010 [place] 05/24/2007 1404   PROTEINUR NEGATIVE 05/24/2007 1404   UROBILINOGEN 0.2 01/22/2010 1457   NITRITE negative 01/22/2010 1457   LEUKOCYTESUR  05/24/2007 1404    NEGATIVE MICROSCOPIC NOT DONE ON URINES WITH NEGATIVE PROTEIN, BLOOD, LEUKOCYTES, NITRITE, OR GLUCOSE <1000 mg/dL.     STUDIES: No results found.   ELIGIBLE FOR AVAILABLE RESEARCH PROTOCOL: No  ASSESSMENT: 47 y.o. CHEK2 positive [place] woman status post right breast biopsy 02/01/2017 for a cT2 pN0, stage IB invasive ductal carcinoma, grade 2, estrogen and progesterone receptor positive, with an MIB-1 of 25%, and HER-2 amplified   (a) biopsy of a suspicious right axillary lymph node 01/24/2017 showed only reactive lymphoid tissue  (b) biopsy of right breast lower outer quadrant 02/17/2017 showed invasive ductal carcinoma, grade 3, estrogen and progesterone receptor positive, HER-2 not amplified, with an Mib-1 of 50%  (1) genetics testing 46-gene Common Hereditary Cancer Panel offered by [facility] identified a single, heterozygous pathogenic gene mutation called CHEK2, c.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 There were no  additional mutations in the remaining 45 genes analyzed: APC, ATM, AXIN2, BARD1, BMPR1A, BRCA1, BRCA2, BRIP1, CDH1, CDKN2A, CTNNA1, DICER1, EPCAM, GREM1, HOXB13, KIT, MEN1, MLH1, MSH2, MSH3, MSH6, MUTYH, NBN, NF1, NTHL1, PALB2, PDGFRA, PMS2, POLD1, POLE, PTEN, RAD50, RAD51C, RAD51D, SDHA, SDHB, SDHC, SDHD, SMAD4, SMARCA4, STK11, TP53, TSC1, TSC2, and VHL.  (a) will need either bilateral mastectomies or intensified screening  (b) colon cancer screening started 2018 (colonoscopy) under Dr. Collene Mares  (2) neoadjuvant chemotherapy with carboplatin, docetaxel, and trastuzumab with pertuzumab given every 3 weeks 6, starting 02/16/2017, completed 06/01/2017  (3) anti-HER-2 immunotherapy to continue to total 1 year (through 02/08/2018)  (a) baseline echocardiogram 02/13/2017 found an ejection fraction of 55-60%  (b) echocardiogram 05/23/2017 found an ejection fraction of 55-60%.  (c) echo on 08/18/2017 demonstrates LVEF of 60-65%  (d) echo on 11/27/2017 showed an ejection fraction in the 55-60 % range  (4) status post bilateral nipple sparing mastectomies with right sentinel lymph node sampling 06/27/2017, showing  (a) on the left, no evidence of malignancy  (b) on the right, residual mpT2 pN0 invasive ductal carcinoma, grade 2, with ample margins  (c) status post bilateral silicone implant placement 10/02/2017  (5) Offered adjuvant radiation by Dr. Sondra Come, however after thoughtful discussion, patient declined  (6) Tamoxifen starting in 07/2017   PLAN: Madalaine is now a little over 6 months out from definitive surgery for breast cancer.  She is recovering very nicely from her surgeries.  She continues to tolerate trastuzumab well and she has 1 more dose after this 1.  She is very eager to have her port removed and I have sent a note to Dr. Excell Seltzer advising him of the date of the last treatment  She is tolerating tamoxifen well and the plan will be to continue that a minimum of 5 years  Her periods have  resumed.  She understands that tamoxifen is not a contraceptive.  She we will take measures to avoid getting pregnant while on tamoxifen  She will have one  final echocardiogram after completing her trastuzumab treatments.  She is seeing her surgeons frequently as well as her new PCP (whom she meets next week).  Accordingly she will return to see me in 6 months  She is undergoing screening for colon cancer through Dr. Collene Mares and tells me her colonoscopy late 2018 was unremarkable  She knows to call for any other issues that may develop before the next visit.    Magrinat, Virgie Dad, MD  01/18/18 3:47 PM Medical Oncology and Hematology [facility] [address] [place], [place] [address] Tel. [phone]    Fax. [phone]  This document serves as a record of services personally performed by Lurline Del, MD. It was created on his behalf by Sheron Nightingale, a trained medical scribe. The creation of this record is based on the scribe's personal observations and the provider's statements to them.   I have reviewed the above documentation for accuracy and completeness, and I agree with the above.

## 2018-01-18 ENCOUNTER — Inpatient Hospital Stay: Payer: 59

## 2018-01-18 ENCOUNTER — Other Ambulatory Visit: Payer: 59

## 2018-01-18 ENCOUNTER — Telehealth: Payer: Self-pay | Admitting: Oncology

## 2018-01-18 ENCOUNTER — Ambulatory Visit: Payer: 59 | Admitting: Oncology

## 2018-01-18 ENCOUNTER — Inpatient Hospital Stay (HOSPITAL_BASED_OUTPATIENT_CLINIC_OR_DEPARTMENT_OTHER): Payer: 59 | Admitting: Oncology

## 2018-01-18 ENCOUNTER — Ambulatory Visit: Payer: 59

## 2018-01-18 ENCOUNTER — Inpatient Hospital Stay: Payer: 59 | Attending: Oncology

## 2018-01-18 VITALS — BP 124/72 | HR 79 | Temp 98.0°F | Resp 18 | Ht 66.0 in | Wt 143.2 lb

## 2018-01-18 DIAGNOSIS — Z170 Estrogen receptor positive status [ER+]: Secondary | ICD-10-CM

## 2018-01-18 DIAGNOSIS — C50311 Malignant neoplasm of lower-inner quadrant of right female breast: Secondary | ICD-10-CM | POA: Diagnosis not present

## 2018-01-18 DIAGNOSIS — N898 Other specified noninflammatory disorders of vagina: Secondary | ICD-10-CM | POA: Diagnosis not present

## 2018-01-18 DIAGNOSIS — C50411 Malignant neoplasm of upper-outer quadrant of right female breast: Secondary | ICD-10-CM

## 2018-01-18 DIAGNOSIS — Z1502 Genetic susceptibility to malignant neoplasm of ovary: Secondary | ICD-10-CM | POA: Diagnosis not present

## 2018-01-18 DIAGNOSIS — Z803 Family history of malignant neoplasm of breast: Secondary | ICD-10-CM | POA: Insufficient documentation

## 2018-01-18 DIAGNOSIS — C50919 Malignant neoplasm of unspecified site of unspecified female breast: Secondary | ICD-10-CM | POA: Insufficient documentation

## 2018-01-18 DIAGNOSIS — Z1509 Genetic susceptibility to other malignant neoplasm: Secondary | ICD-10-CM | POA: Diagnosis not present

## 2018-01-18 DIAGNOSIS — Z79810 Long term (current) use of selective estrogen receptor modulators (SERMs): Secondary | ICD-10-CM | POA: Insufficient documentation

## 2018-01-18 DIAGNOSIS — Z79899 Other long term (current) drug therapy: Secondary | ICD-10-CM | POA: Insufficient documentation

## 2018-01-18 DIAGNOSIS — C50511 Malignant neoplasm of lower-outer quadrant of right female breast: Secondary | ICD-10-CM

## 2018-01-18 DIAGNOSIS — M1990 Unspecified osteoarthritis, unspecified site: Secondary | ICD-10-CM | POA: Diagnosis not present

## 2018-01-18 DIAGNOSIS — Z9882 Breast implant status: Secondary | ICD-10-CM | POA: Diagnosis not present

## 2018-01-18 DIAGNOSIS — Z9221 Personal history of antineoplastic chemotherapy: Secondary | ICD-10-CM | POA: Insufficient documentation

## 2018-01-18 DIAGNOSIS — Z87891 Personal history of nicotine dependence: Secondary | ICD-10-CM | POA: Diagnosis not present

## 2018-01-18 DIAGNOSIS — Z1589 Genetic susceptibility to other disease: Secondary | ICD-10-CM

## 2018-01-18 DIAGNOSIS — Z800 Family history of malignant neoplasm of digestive organs: Secondary | ICD-10-CM | POA: Diagnosis not present

## 2018-01-18 DIAGNOSIS — Z5112 Encounter for antineoplastic immunotherapy: Secondary | ICD-10-CM | POA: Diagnosis present

## 2018-01-18 DIAGNOSIS — Z806 Family history of leukemia: Secondary | ICD-10-CM | POA: Insufficient documentation

## 2018-01-18 LAB — COMPREHENSIVE METABOLIC PANEL
ALBUMIN: 3.8 g/dL (ref 3.5–5.0)
ALK PHOS: 47 U/L (ref 40–150)
ALT: 10 U/L (ref 0–55)
ANION GAP: 7 (ref 3–11)
AST: 14 U/L (ref 5–34)
BILIRUBIN TOTAL: 0.4 mg/dL (ref 0.2–1.2)
BUN: 12 mg/dL (ref 7–26)
CALCIUM: 9.5 mg/dL (ref 8.4–10.4)
CO2: 26 mmol/L (ref 22–29)
Chloride: 105 mmol/L (ref 98–109)
Creatinine, Ser: 0.92 mg/dL (ref 0.60–1.10)
GFR calc Af Amer: >60 mL/min (ref >60)
GFR calc non Af Amer: >60 mL/min (ref >60)
Glucose, Bld: 112 mg/dL (ref 70–140)
Potassium: 3.9 mmol/L (ref 3.5–5.1)
SODIUM: 138 mmol/L (ref 136–145)
Total Protein: 7.4 g/dL (ref 6.4–8.3)

## 2018-01-18 LAB — CBC WITH DIFFERENTIAL/PLATELET
BASOS ABS: 0 10*3/uL (ref 0.0–0.1)
BASOS PCT: 1 %
EOS ABS: 0.1 10*3/uL (ref 0.0–0.5)
Eosinophils Relative: 2 %
HEMATOCRIT: 33.7 % — AB (ref 34.8–46.6)
HEMOGLOBIN: 11.6 g/dL (ref 11.6–15.9)
Lymphocytes Relative: 26 %
Lymphs Abs: 2.2 10*3/uL (ref 0.9–3.3)
MCH: 27.7 pg (ref 25.1–34.0)
MCHC: 34.5 g/dL (ref 31.5–36.0)
MCV: 80.2 fL (ref 79.5–101.0)
MONOS PCT: 11 %
Monocytes Absolute: 1 10*3/uL — ABNORMAL HIGH (ref 0.1–0.9)
NEUTROS ABS: 5.2 10*3/uL (ref 1.5–6.5)
NEUTROS PCT: 60 %
Platelets: 255 10*3/uL (ref 145–400)
RBC: 4.2 MIL/uL (ref 3.70–5.45)
RDW: 13.5 % (ref 11.2–14.5)
WBC: 8.6 10*3/uL (ref 3.9–10.3)

## 2018-01-18 MED ORDER — DIPHENHYDRAMINE HCL 25 MG PO CAPS
ORAL_CAPSULE | ORAL | Status: AC
  Filled 2018-01-18: qty 1

## 2018-01-18 MED ORDER — SODIUM CHLORIDE 0.9 % IV SOLN
Freq: Once | INTRAVENOUS | Status: AC
  Administered 2018-01-18: 16:00:00 via INTRAVENOUS

## 2018-01-18 MED ORDER — HEPARIN SOD (PORK) LOCK FLUSH 100 UNIT/ML IV SOLN
500.0000 [IU] | Freq: Once | INTRAVENOUS | Status: AC | PRN
  Administered 2018-01-18: 500 [IU]
  Filled 2018-01-18: qty 5

## 2018-01-18 MED ORDER — ACETAMINOPHEN 325 MG PO TABS
ORAL_TABLET | ORAL | Status: AC
  Filled 2018-01-18: qty 2

## 2018-01-18 MED ORDER — ACETAMINOPHEN 325 MG PO TABS
650.0000 mg | ORAL_TABLET | Freq: Once | ORAL | Status: AC
  Administered 2018-01-18: 650 mg via ORAL

## 2018-01-18 MED ORDER — SODIUM CHLORIDE 0.9% FLUSH
10.0000 mL | INTRAVENOUS | Status: DC | PRN
  Administered 2018-01-18: 10 mL
  Filled 2018-01-18: qty 10

## 2018-01-18 MED ORDER — TRASTUZUMAB CHEMO 150 MG IV SOLR
6.0000 mg/kg | Freq: Once | INTRAVENOUS | Status: AC
  Administered 2018-01-18: 420 mg via INTRAVENOUS
  Filled 2018-01-18: qty 20

## 2018-01-18 MED ORDER — DIPHENHYDRAMINE HCL 25 MG PO CAPS
25.0000 mg | ORAL_CAPSULE | Freq: Once | ORAL | Status: AC
  Administered 2018-01-18: 25 mg via ORAL

## 2018-01-18 NOTE — Patient Instructions (Signed)
[facility] Discharge Instructions for Patients Receiving Chemotherapy  Today you received the following chemotherapy agents Herceptin  To help prevent nausea and vomiting after your treatment, we encourage you to take your nausea medication as directed   If you develop nausea and vomiting that is not controlled by your nausea medication, call the clinic.   BELOW ARE SYMPTOMS THAT SHOULD BE REPORTED IMMEDIATELY:  *FEVER GREATER THAN 100.5 F  *CHILLS WITH OR WITHOUT FEVER  NAUSEA AND VOMITING THAT IS NOT CONTROLLED WITH YOUR NAUSEA MEDICATION  *UNUSUAL SHORTNESS OF BREATH  *UNUSUAL BRUISING OR BLEEDING  TENDERNESS IN MOUTH AND THROAT WITH OR WITHOUT PRESENCE OF ULCERS  *URINARY PROBLEMS  *BOWEL PROBLEMS  UNUSUAL RASH Items with * indicate a potential emergency and should be followed up as soon as possible.  Feel free to call the clinic should you have any questions or concerns. The clinic phone number is [phone].  Please show the CHEMO ALERT CARD at check-in to the Emergency Department and triage nurse.   

## 2018-01-18 NOTE — Telephone Encounter (Signed)
Gave patient AVS and calendar of upcoming April and September appointments.

## 2018-01-21 NOTE — Progress Notes (Signed)
Subjective:    Patient ID: Olivia Frederick, female    DOB: 09/14/1971, 47 y.o.   MRN: [id number]  HPI She is here to establish with a new pcp.  She is here for a physical exam.   History reviewed.  She has no specific concerns or questions.   Medications and allergies reviewed with patient and updated if appropriate.  Patient Active Problem List   Diagnosis Date Noted  . Allergic rhinitis 01/22/2018  . Family history of diabetes mellitus (DM) 01/22/2018  . CHEK2-related breast cancer ([place]) 01/18/2018  . Breast cancer, right ([facility]) 06/27/2017  . Genetic testing 04/24/2017  . Monoallelic mutation of CHEK2 gene in female patient 04/24/2017  . Malignant neoplasm of upper-outer quadrant of right breast in female, estrogen receptor positive ([place]) 02/02/2017  . PELVIC PAIN, RIGHT 01/22/2010    Current Outpatient Medications on File Prior to Visit  Medication Sig Dispense Refill  . tamoxifen (NOLVADEX) 20 MG tablet Take 20 mg by mouth daily.     . [DISCONTINUED] prochlorperazine (COMPAZINE) 10 MG tablet Take 1 tablet (10 mg total) by mouth every 6 (six) hours as needed (Nausea or vomiting). 30 tablet 1   Current Facility-Administered Medications on File Prior to Visit  Medication Dose Route Frequency Provider Last Rate Last Dose  . sodium chloride flush (NS) 0.9 % injection 10 mL  10 mL Intravenous PRN Magrinat, Virgie Dad, MD   10 mL at 11/16/17 1248    Past Medical History:  Diagnosis Date  . Genetic testing 04/24/2017   [name] underwent genetic counseling and testing for hereditary cancer syndromes on 03/08/2017. 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   The remaining 45 genes analyzed by [facility]'s 46-gene Common Hereditary Cancers Panel were negative for mutations. Genes analyzed include: APC, ATM, AXIN2, BARD1, BMPR1A, BRCA1, BRCA2, BRIP1, CDH1, CDKN2  . History of breast cancer 2018   right  . History of  chemotherapy    finished chemo 05/2017    Past Surgical History:  Procedure Laterality Date  . BREAST RECONSTRUCTION WITH PLACEMENT OF TISSUE EXPANDER AND FLEX HD (ACELLULAR HYDRATED DERMIS) Bilateral 06/27/2017   Procedure: BILATERAL BREAST RECONSTRUCTION WITH PLACEMENT OF TISSUE EXPANDER AND ALLODERM;  Surgeon: Irene Limbo, MD;  Location: [facility];  Service: Plastics;  Laterality: Bilateral;  . CESAREAN SECTION     x 2  . NIPPLE SPARING MASTECTOMY/SENTINAL LYMPH NODE BIOPSY/RECONSTRUCTION/PLACEMENT OF TISSUE EXPANDER Bilateral 06/27/2017   Procedure: BILATERAL NIPPLE SPARING MASTECTOMY WITH RIGHT SENTINAL LYMPH NODE BIOPSY;  Surgeon: Excell Seltzer, MD;  Location: [facility];  Service: General;  Laterality: Bilateral;  . REMOVAL OF BILATERAL TISSUE EXPANDERS WITH PLACEMENT OF BILATERAL BREAST IMPLANTS Bilateral 10/02/2017   Procedure: REMOVAL OF BILATERAL TISSUE EXPANDERS WITH PLACEMENT OF BILATERAL BREAST IMPLANTS;  Surgeon: Irene Limbo, MD;  Location: [facility];  Service: Plastics;  Laterality: Bilateral;    Social History   Socioeconomic History  . Marital status: Married    Spouse name: None  . Number of children: 2  . Years of education: None  . Highest education level: None  Social Needs  . Financial resource strain: None  . Food insecurity - worry: None  . Food insecurity - inability: None  . Transportation needs - medical: None  . Transportation needs - non-medical: None  Occupational History  . None  Tobacco Use  . Smoking status: Former Smoker    Last attempt to quit: 02/01/2017  Years since quitting: 0.9  . Smokeless tobacco: Never Used  . Tobacco comment: social smoking for 20 years  Substance and Sexual Activity  . Alcohol use: Yes    Comment: 6 drinks/week  . Drug use: No  . Sexual activity: None  Other Topics Concern  . None  Social History Narrative   Designer, [employer] company   2 kids 45  (Zach), 10 ( gabby)      Irregular exercise -- walking/running with daughter    Family History  Problem Relation Age of Onset  . Hypertension Mother   . Breast cancer Mother        diagnosed after her  . Thyroid disease Mother   . Other Mother        carotid artery dis  . Diabetes Father   . Leukemia Paternal Grandfather        d.64  . Breast cancer Cousin 40       Bilateral breast cancer. Daughter of paternal uncle with leukemia.  . Leukemia Paternal Uncle 26  . Lung cancer Maternal Uncle 74  . Diabetes Brother   . Brain cancer Maternal Aunt 65       d.66  . Cancer Maternal Uncle        unspecified type  . Brain cancer Cousin 51       d.55 daughter of maternal aunt with brain cancer    Review of Systems  Constitutional: Negative for appetite change, chills, diaphoresis, fatigue, fever and unexpected weight change.  Eyes: Negative for visual disturbance.  Respiratory: Negative for cough, shortness of breath and wheezing.   Cardiovascular: Negative for chest pain, palpitations and leg swelling.  Gastrointestinal: Negative for abdominal pain, blood in stool, constipation, diarrhea and nausea.       No gerd  Genitourinary: Negative for dysuria and hematuria.  Musculoskeletal: Positive for arthralgias (mild).  Skin: Negative for color change.  Neurological: Negative for dizziness, light-headedness and headaches.  Psychiatric/Behavioral: Negative for dysphoric mood and sleep disturbance. The patient is not nervous/anxious.        Objective:   Vitals:   01/22/18 0858  BP: 116/74  Pulse: 79  Resp: 16  Temp: 98.5 F (36.9 C)  SpO2: 98%   BP Readings from Last 3 Encounters:  01/22/18 116/74  01/18/18 124/72  12/28/17 117/65   Wt Readings from Last 3 Encounters:  01/22/18 145 lb (65.8 kg)  01/18/18 143 lb 3.2 oz (65 kg)  12/28/17 142 lb 1.6 oz (64.5 kg)   Body mass index is 23.4 kg/m.   Physical Exam    Constitutional: She appears well-developed and  well-nourished. No distress.  HENT:  Head: Normocephalic and atraumatic.  Right Ear: External ear normal. Normal ear canal and TM Left Ear: External ear normal.  Normal ear canal and TM Mouth/Throat: Oropharynx is clear and moist.  Eyes: Conjunctivae and EOM are normal.  Neck: Neck supple. No tracheal deviation present. No thyromegaly present.  No carotid bruit  Cardiovascular: Normal rate, regular rhythm and normal heart sounds.   No murmur heard.  No edema. Pulmonary/Chest: Effort normal and breath sounds normal. No respiratory distress. She has no wheezes. She has no rales.  Breast: deferred to Gyn Abdominal: Soft. She exhibits no distension. There is no tenderness.  Lymphadenopathy: She has no cervical adenopathy.  Skin: Skin is warm and dry. She is not diaphoretic.  Psychiatric: She has a normal mood and affect. Her behavior is normal.        Assessment & Plan:  See Problem List for Assessment and Plan of chronic medical problems.

## 2018-01-22 ENCOUNTER — Ambulatory Visit (INDEPENDENT_AMBULATORY_CARE_PROVIDER_SITE_OTHER): Payer: 59 | Admitting: Internal Medicine

## 2018-01-22 ENCOUNTER — Other Ambulatory Visit (INDEPENDENT_AMBULATORY_CARE_PROVIDER_SITE_OTHER): Payer: 59

## 2018-01-22 ENCOUNTER — Encounter: Payer: Self-pay | Admitting: Internal Medicine

## 2018-01-22 VITALS — BP 116/74 | HR 79 | Temp 98.5°F | Resp 16 | Ht 66.0 in | Wt 145.0 lb

## 2018-01-22 DIAGNOSIS — Z0000 Encounter for general adult medical examination without abnormal findings: Secondary | ICD-10-CM

## 2018-01-22 DIAGNOSIS — Z833 Family history of diabetes mellitus: Secondary | ICD-10-CM | POA: Diagnosis not present

## 2018-01-22 DIAGNOSIS — C50411 Malignant neoplasm of upper-outer quadrant of right female breast: Secondary | ICD-10-CM

## 2018-01-22 DIAGNOSIS — Z170 Estrogen receptor positive status [ER+]: Secondary | ICD-10-CM | POA: Diagnosis not present

## 2018-01-22 DIAGNOSIS — J309 Allergic rhinitis, unspecified: Secondary | ICD-10-CM | POA: Diagnosis not present

## 2018-01-22 LAB — LIPID PANEL
CHOL/HDL RATIO: 3
Cholesterol: 139 mg/dL (ref 0–200)
HDL: 54.8 mg/dL (ref >39.00)
LDL CALC: 73 mg/dL (ref 0–99)
NONHDL: 84.17
Triglycerides: 55 mg/dL (ref 0.0–149.0)
VLDL: 11 mg/dL (ref 0.0–40.0)

## 2018-01-22 LAB — HEMOGLOBIN A1C: Hgb A1c MFr Bld: 5.5 % (ref 4.6–6.5)

## 2018-01-22 LAB — TSH: TSH: 0.81 u[IU]/mL (ref 0.35–4.50)

## 2018-01-22 NOTE — Assessment & Plan Note (Signed)
Following with Dr. Jana Hakim Taking tamoxifen daily

## 2018-01-22 NOTE — Assessment & Plan Note (Signed)
Father, brother We will check A1c Discussed the importance of a low sugar/carbohydrate diet and regular exercise

## 2018-01-22 NOTE — Assessment & Plan Note (Signed)
Controlled with over-the-counter medication

## 2018-01-22 NOTE — Patient Instructions (Signed)
Test(s) ordered today. Your results will be released to [facility] (or called to you) after review, usually within 72hours after test completion. If any changes need to be made, you will be notified at that same time.  All other Health Maintenance issues reviewed.   All recommended immunizations and age-appropriate screenings are up-to-date or discussed.  No immunizations administered today.   Medications reviewed and updated.  No changes recommended at this time.   Please followup in one year   Health Maintenance, Female Adopting a healthy lifestyle and getting preventive care can go a long way to promote health and wellness. Talk with your health care provider about what schedule of regular examinations is right for you. This is a good chance for you to check in with your provider about disease prevention and staying healthy. In between checkups, there are plenty of things you can do on your own. Experts have done a lot of research about which lifestyle changes and preventive measures are most likely to keep you healthy. Ask your health care provider for more information. Weight and diet Eat a healthy diet  Be sure to include plenty of vegetables, fruits, low-fat dairy products, and lean protein.  Do not eat a lot of foods high in solid fats, added sugars, or salt.  Get regular exercise. This is one of the most important things you can do for your health. ? Most adults should exercise for at least 150 minutes each week. The exercise should increase your heart rate and make you sweat (moderate-intensity exercise). ? Most adults should also do strengthening exercises at least twice a week. This is in addition to the moderate-intensity exercise.  Maintain a healthy weight  Body mass index (BMI) is a measurement that can be used to identify possible weight problems. It estimates body fat based on height and weight. Your health care provider can help determine your BMI and help you achieve or  maintain a healthy weight.  For females 64 years of age and older: ? A BMI below 18.5 is considered underweight. ? A BMI of 18.5 to 24.9 is normal. ? A BMI of 25 to 29.9 is considered overweight. ? A BMI of 30 and above is considered obese.  Watch levels of cholesterol and blood lipids  You should start having your blood tested for lipids and cholesterol at 45 years of age, then have this test every 5 years.  You may need to have your cholesterol levels checked more often if: ? Your lipid or cholesterol levels are high. ? You are older than 45 years of age. ? You are at high risk for heart disease.  Cancer screening Lung Cancer  Lung cancer screening is recommended for adults 72-7 years old who are at high risk for lung cancer because of a history of smoking.  A yearly low-dose CT scan of the lungs is recommended for people who: ? Currently smoke. ? Have quit within the past 15 years. ? Have at least a 30-pack-year history of smoking. A pack year is smoking an average of one pack of cigarettes a day for 1 year.  Yearly screening should continue until it has been 15 years since you quit.  Yearly screening should stop if you develop a health problem that would prevent you from having lung cancer treatment.  Breast Cancer  Practice breast self-awareness. This means understanding how your breasts normally appear and feel.  It also means doing regular breast self-exams. Let your health care provider know about any changes,  no matter how small.  If you are in your 20s or 30s, you should have a clinical breast exam (CBE) by a health care provider every 1-3 years as part of a regular health exam.  If you are 40 or older, have a CBE every year. Also consider having a breast X-ray (mammogram) every year.  If you have a family history of breast cancer, talk to your health care provider about genetic screening.  If you are at high risk for breast cancer, talk to your health care  provider about having an MRI and a mammogram every year.  Breast cancer gene (BRCA) assessment is recommended for women who have family members with BRCA-related cancers. BRCA-related cancers include: ? Breast. ? Ovarian. ? Tubal. ? Peritoneal cancers.  Results of the assessment will determine the need for genetic counseling and BRCA1 and BRCA2 testing.  Cervical Cancer Your health care provider may recommend that you be screened regularly for cancer of the pelvic organs (ovaries, uterus, and vagina). This screening involves a pelvic examination, including checking for microscopic changes to the surface of your cervix (Pap test). You may be encouraged to have this screening done every 3 years, beginning at age 21.  For women ages 30-65, health care providers may recommend pelvic exams and Pap testing every 3 years, or they may recommend the Pap and pelvic exam, combined with testing for human papilloma virus (HPV), every 5 years. Some types of HPV increase your risk of cervical cancer. Testing for HPV may also be done on women of any age with unclear Pap test results.  Other health care providers may not recommend any screening for nonpregnant women who are considered low risk for pelvic cancer and who do not have symptoms. Ask your health care provider if a screening pelvic exam is right for you.  If you have had past treatment for cervical cancer or a condition that could lead to cancer, you need Pap tests and screening for cancer for at least 20 years after your treatment. If Pap tests have been discontinued, your risk factors (such as having a new sexual partner) need to be reassessed to determine if screening should resume. Some women have medical problems that increase the chance of getting cervical cancer. In these cases, your health care provider may recommend more frequent screening and Pap tests.  Colorectal Cancer  This type of cancer can be detected and often prevented.  Routine  colorectal cancer screening usually begins at 45 years of age and continues through 45 years of age.  Your health care provider may recommend screening at an earlier age if you have risk factors for colon cancer.  Your health care provider may also recommend using home test kits to check for hidden blood in the stool.  A small camera at the end of a tube can be used to examine your colon directly (sigmoidoscopy or colonoscopy). This is done to check for the earliest forms of colorectal cancer.  Routine screening usually begins at age 50.  Direct examination of the colon should be repeated every 5-10 years through 45 years of age. However, you may need to be screened more often if early forms of precancerous polyps or small growths are found.  Skin Cancer  Check your skin from head to toe regularly.  Tell your health care provider about any new moles or changes in moles, especially if there is a change in a mole's shape or color.  Also tell your health care provider if you   have a mole that is larger than the size of a pencil eraser.  Always use sunscreen. Apply sunscreen liberally and repeatedly throughout the day.  Protect yourself by wearing long sleeves, pants, a wide-brimmed hat, and sunglasses whenever you are outside.  Heart disease, diabetes, and high blood pressure  High blood pressure causes heart disease and increases the risk of stroke. High blood pressure is more likely to develop in: ? People who have blood pressure in the high end of the normal range (130-139/85-89 mm Hg). ? People who are overweight or obese. ? People who are African American.  If you are 24-25 years of age, have your blood pressure checked every 3-5 years. If you are 2 years of age or older, have your blood pressure checked every year. You should have your blood pressure measured twice-once when you are at a hospital or clinic, and once when you are not at a hospital or clinic. Record the average of the  two measurements. To check your blood pressure when you are not at a hospital or clinic, you can use: ? An automated blood pressure machine at a pharmacy. ? A home blood pressure monitor.  If you are between 42 years and 59 years old, ask your health care provider if you should take aspirin to prevent strokes.  Have regular diabetes screenings. This involves taking a blood sample to check your fasting blood sugar level. ? If you are at a normal weight and have a low risk for diabetes, have this test once every three years after 45 years of age. ? If you are overweight and have a high risk for diabetes, consider being tested at a younger age or more often. Preventing infection Hepatitis B  If you have a higher risk for hepatitis B, you should be screened for this virus. You are considered at high risk for hepatitis B if: ? You were born in a country where hepatitis B is common. Ask your health care provider which countries are considered high risk. ? Your parents were born in a high-risk country, and you have not been immunized against hepatitis B (hepatitis B vaccine). ? You have HIV or AIDS. ? You use needles to inject street drugs. ? You live with someone who has hepatitis B. ? You have had sex with someone who has hepatitis B. ? You get hemodialysis treatment. ? You take certain medicines for conditions, including cancer, organ transplantation, and autoimmune conditions.  Hepatitis C  Blood testing is recommended for: ? Everyone born from [birth year] through 1963. ? Anyone with known risk factors for hepatitis C.  Sexually transmitted infections (STIs)  You should be screened for sexually transmitted infections (STIs) including gonorrhea and chlamydia if: ? You are sexually active and are younger than 45 years of age. ? You are older than 45 years of age and your health care provider tells you that you are at risk for this type of infection. ? Your sexual activity has changed since you  were last screened and you are at an increased risk for chlamydia or gonorrhea. Ask your health care provider if you are at risk.  If you do not have HIV, but are at risk, it may be recommended that you take a prescription medicine daily to prevent HIV infection. This is called pre-exposure prophylaxis (PrEP). You are considered at risk if: ? You are sexually active and do not regularly use condoms or know the HIV status of your partner(s). ? You take drugs by injection. ?  You are sexually active with a partner who has HIV.  Talk with your health care provider about whether you are at high risk of being infected with HIV. If you choose to begin PrEP, you should first be tested for HIV. You should then be tested every 3 months for as long as you are taking PrEP. Pregnancy  If you are premenopausal and you may become pregnant, ask your health care provider about preconception counseling.  If you may become pregnant, take 400 to 800 micrograms (mcg) of folic acid every day.  If you want to prevent pregnancy, talk to your health care provider about birth control (contraception). Osteoporosis and menopause  Osteoporosis is a disease in which the bones lose minerals and strength with aging. This can result in serious bone fractures. Your risk for osteoporosis can be identified using a bone density scan.  If you are 65 years of age or older, or if you are at risk for osteoporosis and fractures, ask your health care provider if you should be screened.  Ask your health care provider whether you should take a calcium or vitamin D supplement to lower your risk for osteoporosis.  Menopause may have certain physical symptoms and risks.  Hormone replacement therapy may reduce some of these symptoms and risks. Talk to your health care provider about whether hormone replacement therapy is right for you. Follow these instructions at home:  Schedule regular health, dental, and eye exams.  Stay current  with your immunizations.  Do not use any tobacco products including cigarettes, chewing tobacco, or electronic cigarettes.  If you are pregnant, do not drink alcohol.  If you are breastfeeding, limit how much and how often you drink alcohol.  Limit alcohol intake to no more than 1 drink per day for nonpregnant women. One drink equals 12 ounces of beer, 5 ounces of wine, or 1 ounces of hard liquor.  Do not use street drugs.  Do not share needles.  Ask your health care provider for help if you need support or information about quitting drugs.  Tell your health care provider if you often feel depressed.  Tell your health care provider if you have ever been abused or do not feel safe at home. This information is not intended to replace advice given to you by your health care provider. Make sure you discuss any questions you have with your health care provider. Document Released: 05/09/2011 Document Revised: 03/31/2016 Document Reviewed: 07/28/2015 Elsevier Interactive Patient Education  2018 [employer]  

## 2018-02-08 ENCOUNTER — Inpatient Hospital Stay: Payer: 59 | Attending: Oncology

## 2018-02-08 ENCOUNTER — Encounter: Payer: Self-pay | Admitting: *Deleted

## 2018-02-08 VITALS — BP 119/71 | HR 67 | Temp 98.9°F | Resp 20

## 2018-02-08 DIAGNOSIS — Z170 Estrogen receptor positive status [ER+]: Secondary | ICD-10-CM | POA: Insufficient documentation

## 2018-02-08 DIAGNOSIS — C50511 Malignant neoplasm of lower-outer quadrant of right female breast: Secondary | ICD-10-CM | POA: Insufficient documentation

## 2018-02-08 DIAGNOSIS — Z5112 Encounter for antineoplastic immunotherapy: Secondary | ICD-10-CM | POA: Insufficient documentation

## 2018-02-08 DIAGNOSIS — C50411 Malignant neoplasm of upper-outer quadrant of right female breast: Secondary | ICD-10-CM

## 2018-02-08 MED ORDER — DIPHENHYDRAMINE HCL 25 MG PO CAPS
25.0000 mg | ORAL_CAPSULE | Freq: Once | ORAL | Status: AC
  Administered 2018-02-08: 25 mg via ORAL

## 2018-02-08 MED ORDER — ACETAMINOPHEN 325 MG PO TABS
650.0000 mg | ORAL_TABLET | Freq: Once | ORAL | Status: AC
  Administered 2018-02-08: 650 mg via ORAL

## 2018-02-08 MED ORDER — ACETAMINOPHEN 325 MG PO TABS
ORAL_TABLET | ORAL | Status: AC
  Filled 2018-02-08: qty 2

## 2018-02-08 MED ORDER — SODIUM CHLORIDE 0.9% FLUSH
10.0000 mL | INTRAVENOUS | Status: DC | PRN
  Administered 2018-02-08: 10 mL
  Filled 2018-02-08: qty 10

## 2018-02-08 MED ORDER — SODIUM CHLORIDE 0.9 % IV SOLN
Freq: Once | INTRAVENOUS | Status: AC
  Administered 2018-02-08: 14:00:00 via INTRAVENOUS

## 2018-02-08 MED ORDER — HEPARIN SOD (PORK) LOCK FLUSH 100 UNIT/ML IV SOLN
500.0000 [IU] | Freq: Once | INTRAVENOUS | Status: AC | PRN
Start: 2018-02-08 — End: 2018-02-08
  Administered 2018-02-08: 500 [IU]
  Filled 2018-02-08: qty 5

## 2018-02-08 MED ORDER — DIPHENHYDRAMINE HCL 25 MG PO CAPS
ORAL_CAPSULE | ORAL | Status: AC
  Filled 2018-02-08: qty 1

## 2018-02-08 MED ORDER — SODIUM CHLORIDE 0.9 % IV SOLN
6.0000 mg/kg | Freq: Once | INTRAVENOUS | Status: AC
  Administered 2018-02-08: 420 mg via INTRAVENOUS
  Filled 2018-02-08: qty 20

## 2018-03-06 ENCOUNTER — Ambulatory Visit (HOSPITAL_BASED_OUTPATIENT_CLINIC_OR_DEPARTMENT_OTHER)
Admission: RE | Admit: 2018-03-06 | Discharge: 2018-03-06 | Disposition: A | Discharge status: Home / Self Care | Payer: 59 | Source: Ambulatory Visit | Attending: Cardiology | Admitting: Cardiology

## 2018-03-06 ENCOUNTER — Other Ambulatory Visit: Payer: Self-pay

## 2018-03-06 ENCOUNTER — Ambulatory Visit (HOSPITAL_COMMUNITY)
Admission: RE | Admit: 2018-03-06 | Discharge: 2018-03-06 | Disposition: A | Discharge status: Home / Self Care | Payer: 59 | Source: Ambulatory Visit | Attending: Internal Medicine | Admitting: Internal Medicine

## 2018-03-06 ENCOUNTER — Encounter (HOSPITAL_COMMUNITY): Payer: Self-pay | Admitting: Cardiology

## 2018-03-06 VITALS — BP 119/62 | HR 62 | Wt 141.2 lb

## 2018-03-06 DIAGNOSIS — Z803 Family history of malignant neoplasm of breast: Secondary | ICD-10-CM | POA: Diagnosis not present

## 2018-03-06 DIAGNOSIS — Z1509 Genetic susceptibility to other malignant neoplasm: Secondary | ICD-10-CM | POA: Diagnosis not present

## 2018-03-06 DIAGNOSIS — Z170 Estrogen receptor positive status [ER+]: Secondary | ICD-10-CM

## 2018-03-06 DIAGNOSIS — Z87891 Personal history of nicotine dependence: Secondary | ICD-10-CM | POA: Diagnosis not present

## 2018-03-06 DIAGNOSIS — C50411 Malignant neoplasm of upper-outer quadrant of right female breast: Secondary | ICD-10-CM

## 2018-03-06 DIAGNOSIS — C50919 Malignant neoplasm of unspecified site of unspecified female breast: Secondary | ICD-10-CM | POA: Diagnosis not present

## 2018-03-06 DIAGNOSIS — Z1589 Genetic susceptibility to other disease: Secondary | ICD-10-CM

## 2018-03-06 DIAGNOSIS — Z1502 Genetic susceptibility to malignant neoplasm of ovary: Secondary | ICD-10-CM | POA: Diagnosis not present

## 2018-03-06 NOTE — Progress Notes (Signed)
Oncology: Dr. Jana Hakim  47 y.o. with no significant past history presented for cardio-oncology evaluation with triple positive breast cancer.  Breast cancer was diagnosed 3/18, ER+/PR+/HER2+.  She completed chemotherapy starting on 02/16/17 with carboplatin, docetaxel, trastuzumab, pertuzumab.  She has had bilateral mastectomy.  She completed Hercepin (1 year total) earlier this month.   She has no prior cardiac disease. Uncle with CABG in his 63s, otherwise no heart disease in her family.  She is a prior smoker.  No exertional dyspnea or chest pain, good exercise tolerance.   PMH: 1.  Breast cancer: Diagnosed 3/18, ER+/PR+/HER2+.  She will get chemotherapy starting on 02/16/17 with carboplatin, docetaxel, trastuzumab, pertuzumab x 6 cycles.  She will then continue Herceptin to complete 1 year.  She will have definitive surgery and radiation after chemo.  - Echo (3/18): EF 16-10%, normal diastolic function, GLS -96.0%.  - Echo (7/18): EF 55-60%, GLS -19.7% - Echo (10/18): EF 55-60%, GLS - 18.6. Small pericardial effusion adjacent to right ventricle.  - Echo (1/19): EF 55-60% - Echo (4/19): EF 55-60%, GLS -19.5%  Social History   Socioeconomic History  . Marital status: Married    Spouse name: Not on file  . Number of children: 2  . Years of education: Not on file  . Highest education level: Not on file  Occupational History  . Not on file  Social Needs  . Financial resource strain: Not on file  . Food insecurity:    Worry: Not on file    Inability: Not on file  . Transportation needs:    Medical: Not on file    Non-medical: Not on file  Tobacco Use  . Smoking status: Former Smoker    Last attempt to quit: 02/01/2017    Years since quitting: 1.0  . Smokeless tobacco: Never Used  . Tobacco comment: social smoking for 20 years  Substance and Sexual Activity  . Alcohol use: Yes    Comment: 6 drinks/week  . Drug use: No  . Sexual activity: Not on file  Lifestyle  .  Physical activity:    Days per week: Not on file    Minutes per session: Not on file  . Stress: Not on file  Relationships  . Social connections:    Talks on phone: Not on file    Gets together: Not on file    Attends religious service: Not on file    Active member of club or organization: Not on file    Attends meetings of clubs or organizations: Not on file    Relationship status: Not on file  . Intimate partner violence:    Fear of current or ex partner: Not on file    Emotionally abused: Not on file    Physically abused: Not on file    Forced sexual activity: Not on file  Other Topics Concern  . Not on file  Social History Narrative   Olivia Frederick   2 kids 3 (Olivia Frederick), 10 ( Olivia Frederick)      Irregular exercise -- walking/running with daughter   Family History  Problem Relation Age of Onset  . Hypertension Mother   . Breast cancer Mother        diagnosed after her  . Thyroid disease Mother   . Other Mother        carotid artery dis  . Diabetes Father   . Leukemia Paternal Grandfather        d.64  . Breast cancer Cousin  40       Bilateral breast cancer. Daughter of paternal uncle with leukemia.  . Leukemia Paternal Uncle 36  . Lung cancer Maternal Uncle 74  . Diabetes Brother   . Brain cancer Maternal Aunt 65       d.66  . Cancer Maternal Uncle        unspecified type  . Brain cancer Cousin 66       d.55 daughter of maternal aunt with brain cancer   ROS: All systems reviewed and negative except as per HPI.   Current Outpatient Medications  Medication Sig Dispense Refill  . tamoxifen (NOLVADEX) 20 MG tablet Take 20 mg by mouth daily.      No current facility-administered medications for this encounter.    Facility-Administered Medications Ordered in Other Encounters  Medication Dose Route Frequency Provider Last Rate Last Dose  . sodium chloride flush (NS) 0.9 % injection 10 mL  10 mL Intravenous PRN Magrinat, Virgie Dad, MD   10 mL at 11/16/17 1248    BP 119/62   Pulse 62   Wt 141 lb 4 oz (64.1 kg)   SpO2 100%   BMI 22.80 kg/m  General: NAD Neck: No JVD, no thyromegaly or thyroid nodule.  Lungs: Clear to auscultation bilaterally with normal respiratory effort. CV: Nondisplaced PMI.  Heart regular S1/S2, no S3/S4, no murmur.  No peripheral edema.  No carotid bruit.  Normal pedal pulses.  Abdomen: Soft, nontender, no hepatosplenomegaly, no distention.  Skin: Intact without lesions or rashes.  Neurologic: Alert and oriented x 3.  Psych: Normal affect. Extremities: No clubbing or cyanosis.  HEENT: Normal.   Assessment/Plan:  1. Breast CA: estrogen and progesterone receptor positive, HER-2 not amplified, with an Mib-1 of 50%.  She has completed her course of Herceptin (earlier this month).  Screening echoes have remained normal.  Echo today was reviewed, EF and strain pattern unchanged from prior. - She has completed Herceptin and will need no further screening echoes.    [name] 03/06/2018

## 2018-03-06 NOTE — Patient Instructions (Signed)
Your physician recommends that you schedule a follow-up appointment [id number] Up as Needed

## 2018-03-06 NOTE — Progress Notes (Signed)
  Echocardiogram 2D Echocardiogram has been performed.  Olivia Frederick 03/06/2018, 10:01 AM

## 2018-07-05 NOTE — Progress Notes (Signed)
[facility]  [phone] [phone]     ID: IRVING LUBBERS DOB: 11-Jan-1971  MR#: [id number]  RFV#:[id number]  Patient Care Team: Binnie Rail, MD as PCP - General (Internal Medicine) Excell Seltzer, MD as Consulting Physician (General Surgery) Ladanian Kelter, Virgie Dad, MD as Consulting Physician (Oncology) Gery Pray, MD as Consulting Physician (Radiation Oncology) Juanita Craver, MD as Consulting Physician (Gastroenterology) Delice Bison, Charlestine Massed, NP as Nurse Practitioner (Hematology and Oncology) OTHER MD:  CHIEF COMPLAINT: Triple positive breast cancer  CURRENT TREATMENT: tamoxifen   BREAST CANCER HISTORY: From the original intake note:  Seaira noted a change in her right breast around November 2017. She was not very concerned about it but did mention it at the time of mammography which had been scheduled for 01/30/2017. Accordingly she had bilateral diagnostic mammography with tomography and right breast ultrasonography that day at the [facility]. In the right breast there was an irregular mass measuring approximately 5 cm. On exam this was firm and palpable at the 12:00 location 2 cm from the nipple targeted ultrasonography showed an area of mixed echogenicity superiorly measuring at least 4.6 cm with a similar appearing adjacent area measuring 1.3 cm thought to be contiguous ultrasound of the right axilla showed one ymph node with cortical thickening.  Biopsy of the right breast mass 01/31/2017 showed ([id number]) invasive ductal carcinoma, grade 2, estrogen receptor 100% positive, with strong staining intensity, progesterone receptor 25% positive, with moderate staining intensity, with an MIB-1 of 25%, and HER-2 amplified with a signals ratio of 2.35, the number per cell being 4.0. Biopsy of the suspicious axillary lymph node showed only reactive  Her subsequent history is as detailed below  INTERVAL HISTORY: Varney Biles returns today  for follow up and treatment of her estrogen receptor positive breast cancer. She completed a year of trastuzumab, with her final dose on 02/08/2018. Final echocardiogram on 03/06/2018 showed an ejection fraction in the 55-60% range.  She continues on tamoxifen, with good tolerance. She denies issues with hot flashes. Her periods returned in October 2018 and continued until April 2019. She did not have another period again until July 2019, and again in August 2019. For contraception, she is using the rhythm method for contraception, although she will follow up with her gynecologist, Dr. Nori Riis for other options.    REVIEW OF SYSTEMS: Shawnique reports that she and her family went to [facility] recently, and they enjoyed their time there. She also saw her daughter play in a soccer tournament in [place]. For exercise, she walks for about 1 hour everyday. She is glad that her hair is growing. She denies unusual headaches, visual changes, nausea, vomiting, or dizziness. There has been no unusual cough, phlegm production, or pleurisy. There has been no change in bowel or bladder habits. She denies unexplained fatigue or unexplained weight loss, bleeding, rash, or fever. A detailed review of systems was otherwise stable.    PAST MEDICAL HISTORY: Past Medical History:  Diagnosis Date  . Genetic testing 04/24/2017   Ms. Konkel underwent genetic counseling and testing for hereditary cancer syndromes on 03/08/2017. 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   The remaining 45 genes analyzed by [facility]'s 46-gene Common Hereditary Cancers Panel were negative for mutations. Genes analyzed include: APC, ATM, AXIN2, BARD1, BMPR1A, BRCA1, BRCA2, BRIP1, CDH1, CDKN2  . History of breast cancer 2018   right  . History of chemotherapy    finished chemo 05/2017    PAST  SURGICAL HISTORY: Past Surgical History:  Procedure Laterality Date  . BREAST RECONSTRUCTION WITH  PLACEMENT OF TISSUE EXPANDER AND FLEX HD (ACELLULAR HYDRATED DERMIS) Bilateral 06/27/2017   Procedure: BILATERAL BREAST RECONSTRUCTION WITH PLACEMENT OF TISSUE EXPANDER AND ALLODERM;  Surgeon: Irene Limbo, MD;  Location: [facility];  Service: Plastics;  Laterality: Bilateral;  . CESAREAN SECTION     x 2  . NIPPLE SPARING MASTECTOMY/SENTINAL LYMPH NODE BIOPSY/RECONSTRUCTION/PLACEMENT OF TISSUE EXPANDER Bilateral 06/27/2017   Procedure: BILATERAL NIPPLE SPARING MASTECTOMY WITH RIGHT SENTINAL LYMPH NODE BIOPSY;  Surgeon: Excell Seltzer, MD;  Location: [facility];  Service: General;  Laterality: Bilateral;  . REMOVAL OF BILATERAL TISSUE EXPANDERS WITH PLACEMENT OF BILATERAL BREAST IMPLANTS Bilateral 10/02/2017   Procedure: REMOVAL OF BILATERAL TISSUE EXPANDERS WITH PLACEMENT OF BILATERAL BREAST IMPLANTS;  Surgeon: Irene Limbo, MD;  Location: [facility];  Service: Plastics;  Laterality: Bilateral;    FAMILY HISTORY Family History  Problem Relation Age of Onset  . Hypertension Mother   . Breast cancer Mother        diagnosed after her  . Thyroid disease Mother   . Other Mother        carotid artery dis  . Diabetes Father   . Leukemia Paternal Grandfather        d.64  . Breast cancer Cousin 40       Bilateral breast cancer. Daughter of paternal uncle with leukemia.  . Leukemia Paternal Uncle 38  . Lung cancer Maternal Uncle 74  . Diabetes Brother   . Brain cancer Maternal Aunt 65       d.66  . Cancer Maternal Uncle        unspecified type  . Brain cancer Cousin 49       d.55 daughter of maternal aunt with brain cancer  The patient's parents are living, both in their early 14s as of April 2018. She has one brother and one sister. On the paternal side a grandfather was diagnosed with leukemia at age 55, an uncle also with leukemia at age 38, another uncle at age 43 with colon cancer, and a cousin with breast cancer at age 56. On the  mother's side there was an uncle with cancer of some type, not known to the patient, diagnosed age 45   GYNECOLOGIC HISTORY:  No LMP recorded. (Menstrual status: Chemotherapy).  Menarche age 47. She is GX P2, first live birth age 40.  Her periods were briefly interrupted with chemotherapy but resumed on October 2019. Her periods, again briefly stopped for 3 months starting in April 2019 and resumed July 2019. She never used oral contraceptives. She is currently using the rhythm method.   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 Son [name] is 64, daughter Cavity is 27, as of April 2018.    ADVANCED DIRECTIVES: Not in place    HEALTH MAINTENANCE: Social History   Tobacco Use  . Smoking status: Former Smoker    Last attempt to quit: 02/01/2017    Years since quitting: 1.4  . Smokeless tobacco: Never Used  . Tobacco comment: social smoking for 20 years  Substance Use Topics  . Alcohol use: Yes    Comment: 6 drinks/week  . Drug use: No     Colonoscopy: n/a  PAP:  Bone density:   No Known Allergies  Current Outpatient Medications  Medication Sig Dispense Refill  . tamoxifen (NOLVADEX) 20 MG tablet Take 20 mg by mouth  daily.      No current facility-administered medications for this visit.    Facility-Administered Medications Ordered in Other Visits  Medication Dose Route Frequency Provider Last Rate Last Dose  . sodium chloride flush (NS) 0.9 % injection 10 mL  10 mL Intravenous PRN Fran Neiswonger, Virgie Dad, MD   10 mL at 11/16/17 1248    OBJECTIVE: Young white woman in no acute distress Vitals:   07/10/18 1600  BP: 125/78  Pulse: 70  Resp: 18  Temp: 98.4 F (36.9 C)  SpO2: 100%     Body mass index is 23.13 kg/m.    Filed Weights   07/10/18 1600  Weight: 143 lb 4.8 oz (65 kg)     ECOG FS:0 - Asymptomatic   Sclerae unicteric, pupils round and equal Oropharynx clear and moist No cervical or supraclavicular adenopathy Lungs no rales or  rhonchi Heart regular rate and rhythm Abd soft, nontender, positive bowel sounds MSK no focal spinal tenderness, no upper extremity lymphedema Neuro: nonfocal, well oriented, appropriate affect Breasts: Status post bilateral nipple sparing mastectomies with silicone implants reconstruction.  There is no evidence of local recurrence.  Both axillae are benign.  LAB RESULTS:  CMP     Component Value Date/Time   NA 142 07/10/2018 1504   NA 141 10/26/2017 1442   K 4.0 07/10/2018 1504   K 4.3 10/26/2017 1442   CL 104 07/10/2018 1504   CO2 28 07/10/2018 1504   CO2 26 10/26/2017 1442   GLUCOSE 82 07/10/2018 1504   GLUCOSE 91 10/26/2017 1442   BUN 5 (L) 07/10/2018 1504   BUN 8.1 10/26/2017 1442   CREATININE 0.81 07/10/2018 1504   CREATININE 0.8 10/26/2017 1442   CALCIUM 9.6 07/10/2018 1504   CALCIUM 9.4 10/26/2017 1442   PROT 7.3 07/10/2018 1504   PROT 7.3 10/26/2017 1442   ALBUMIN 3.9 07/10/2018 1504   ALBUMIN 4.0 10/26/2017 1442   AST 14 (L) 07/10/2018 1504   AST 13 10/26/2017 1442   ALT 7 07/10/2018 1504   ALT 8 10/26/2017 1442   ALKPHOS 41 07/10/2018 1504   ALKPHOS 49 10/26/2017 1442   BILITOT 0.3 07/10/2018 1504   BILITOT 0.31 10/26/2017 1442   GFRNONAA >60 07/10/2018 1504   GFRAA >60 07/10/2018 1504    No results found for: TOTALPROTELP, ALBUMINELP, A1GS, A2GS, BETS, BETA2SER, GAMS, MSPIKE, SPEI  No results found for: Nils Pyle, [facility]  Lab Results  Component Value Date   WBC 7.5 07/10/2018   NEUTROABS 4.9 07/10/2018   HGB 11.5 (L) 07/10/2018   HCT 32.7 (L) 07/10/2018   MCV 79.6 07/10/2018   PLT 249 07/10/2018      Chemistry      Component Value Date/Time   NA 142 07/10/2018 1504   NA 141 10/26/2017 1442   K 4.0 07/10/2018 1504   K 4.3 10/26/2017 1442   CL 104 07/10/2018 1504   CO2 28 07/10/2018 1504   CO2 26 10/26/2017 1442   BUN 5 (L) 07/10/2018 1504   BUN 8.1 10/26/2017 1442   CREATININE 0.81 07/10/2018 1504   CREATININE 0.8  10/26/2017 1442      Component Value Date/Time   CALCIUM 9.6 07/10/2018 1504   CALCIUM 9.4 10/26/2017 1442   ALKPHOS 41 07/10/2018 1504   ALKPHOS 49 10/26/2017 1442   AST 14 (L) 07/10/2018 1504   AST 13 10/26/2017 1442   ALT 7 07/10/2018 1504   ALT 8 10/26/2017 1442   BILITOT 0.3 07/10/2018 1504   BILITOT  0.31 10/26/2017 1442       No results found for: LABCA2  No components found for: [id number]  No results for input(s): INR in the last 168 hours.  Urinalysis    Component Value Date/Time   COLORURINE yellow 01/22/2010 1457   APPEARANCEUR Clear 01/22/2010 1457   LABSPEC 1.015 01/22/2010 1457   PHURINE 5.0 01/22/2010 1457   GLUCOSEU NEGATIVE 05/24/2007 1404   HGBUR trace-lysed 01/22/2010 1457   BILIRUBINUR negative 01/22/2010 [place] 05/24/2007 1404   PROTEINUR NEGATIVE 05/24/2007 1404   UROBILINOGEN 0.2 01/22/2010 1457   NITRITE negative 01/22/2010 1457   LEUKOCYTESUR  05/24/2007 1404    NEGATIVE MICROSCOPIC NOT DONE ON URINES WITH NEGATIVE PROTEIN, BLOOD, LEUKOCYTES, NITRITE, OR GLUCOSE <1000 mg/dL.     STUDIES: No results found.   ELIGIBLE FOR AVAILABLE RESEARCH PROTOCOL: No  ASSESSMENT: 47 y.o. CHEK2 positive [place] woman status post right breast biopsy 02/01/2017 for a cT2 pN0, stage IB invasive ductal carcinoma, grade 2, estrogen and progesterone receptor positive, with an MIB-1 of 25%, and HER-2 amplified   (a) biopsy of a suspicious right axillary lymph node 01/24/2017 showed only reactive lymphoid tissue  (b) biopsy of right breast lower outer quadrant 02/17/2017 showed invasive ductal carcinoma, grade 3, estrogen and progesterone receptor positive, HER-2 not amplified, with an Mib-1 of 50%  (1) genetics testing 46-gene Common Hereditary Cancer Panel offered by [facility] identified a single, heterozygous pathogenic gene mutation called CHEK2, c.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 There were no additional mutations in the remaining 45 genes  analyzed: APC, ATM, AXIN2, BARD1, BMPR1A, BRCA1, BRCA2, BRIP1, CDH1, CDKN2A, CTNNA1, DICER1, EPCAM, GREM1, HOXB13, KIT, MEN1, MLH1, MSH2, MSH3, MSH6, MUTYH, NBN, NF1, NTHL1, PALB2, PDGFRA, PMS2, POLD1, POLE, PTEN, RAD50, RAD51C, RAD51D, SDHA, SDHB, SDHC, SDHD, SMAD4, SMARCA4, STK11, TP53, TSC1, TSC2, and VHL.  (a) will need either bilateral mastectomies or intensified screening  (b) colon cancer screening started 2018 (colonoscopy) under Dr. Collene Mares  (2) neoadjuvant chemotherapy with carboplatin, docetaxel, and trastuzumab with pertuzumab given every 3 weeks 6, starting 02/16/2017, completed 06/01/2017  (3) anti-HER-2 immunotherapy completed to total 1 year (through 02/08/2018)  (a) baseline echocardiogram 02/13/2017 found an ejection fraction of 55-60%  (b) echocardiogram 05/23/2017 found an ejection fraction of 55-60%.  (c) echo on 08/18/2017 demonstrates LVEF of 60-65%  (d) echo on 11/27/2017 showed an ejection fraction in the 55-60 % range\  (e) echocardiogram on 03/06/2018 showed an ejection fraction in the 55-60% range   (4) status post bilateral nipple sparing mastectomies with right sentinel lymph node sampling 06/27/2017, showing  (a) on the left, no evidence of malignancy  (b) on the right, residual mpT2 pN0 invasive ductal carcinoma, grade 2, with ample margins  (c) status post bilateral silicone implant placement 10/02/2017  (5) Offered adjuvant radiation by Dr. Sondra Come, however after thoughtful discussion, patient declined  (6) Tamoxifen started 07/2017   PLAN: Kei is now just over a year out from definitive surgery for her breast cancer with no evidence of disease activity.  This is favorable.  She is tolerating tamoxifen well.  The plan is to continue that for a minimum of 5 years.  She is still menstruating, although irregularly.  In the past she has used the rhythm method for contraception and it has worked well for her.  However right now this would not be reliable as  tamoxifen is likely to change the cervical mucus and as noted above has certainly made her periods irregular.  I strongly urged her to use contraception  if she does not want to get pregnant. I suggested specifically she consider a Mirena IUD.  She will discuss this with her gynecologist Dr. Milta Deiters  Otherwise at this point I am comfortable seeing her on a once a year basis.  She knows to call for any other issues that may develop before the next visit.  Incidentally she was eager to share her story and I think she would be a great speaker at the upcoming October presentation in [facility] [employer] [facility]    Pheonix Wisby, Virgie Dad, MD  07/10/18 4:11 PM Medical Oncology and Hematology [facility] [address] [place], [place] [address] Tel. [phone]    Fax. [phone]  Alice Rieger, am acting as scribe for Chauncey Cruel MD.  I, Lurline Del MD, have reviewed the above documentation for accuracy and completeness, and I agree with the above.

## 2018-07-06 ENCOUNTER — Other Ambulatory Visit: Payer: Self-pay | Admitting: *Deleted

## 2018-07-06 DIAGNOSIS — C50411 Malignant neoplasm of upper-outer quadrant of right female breast: Secondary | ICD-10-CM

## 2018-07-06 DIAGNOSIS — Z170 Estrogen receptor positive status [ER+]: Principal | ICD-10-CM

## 2018-07-10 ENCOUNTER — Inpatient Hospital Stay (HOSPITAL_BASED_OUTPATIENT_CLINIC_OR_DEPARTMENT_OTHER): Payer: 59 | Admitting: Oncology

## 2018-07-10 ENCOUNTER — Inpatient Hospital Stay: Payer: 59 | Attending: Oncology

## 2018-07-10 VITALS — BP 125/78 | HR 70 | Temp 98.4°F | Resp 18 | Ht 66.0 in | Wt 143.3 lb

## 2018-07-10 DIAGNOSIS — Z1589 Genetic susceptibility to other disease: Secondary | ICD-10-CM

## 2018-07-10 DIAGNOSIS — Z9221 Personal history of antineoplastic chemotherapy: Secondary | ICD-10-CM | POA: Diagnosis not present

## 2018-07-10 DIAGNOSIS — C50511 Malignant neoplasm of lower-outer quadrant of right female breast: Secondary | ICD-10-CM | POA: Diagnosis present

## 2018-07-10 DIAGNOSIS — C50411 Malignant neoplasm of upper-outer quadrant of right female breast: Secondary | ICD-10-CM

## 2018-07-10 DIAGNOSIS — Z9013 Acquired absence of bilateral breasts and nipples: Secondary | ICD-10-CM

## 2018-07-10 DIAGNOSIS — Z1502 Genetic susceptibility to malignant neoplasm of ovary: Secondary | ICD-10-CM

## 2018-07-10 DIAGNOSIS — Z79810 Long term (current) use of selective estrogen receptor modulators (SERMs): Secondary | ICD-10-CM | POA: Insufficient documentation

## 2018-07-10 DIAGNOSIS — Z1509 Genetic susceptibility to other malignant neoplasm: Secondary | ICD-10-CM

## 2018-07-10 DIAGNOSIS — Z170 Estrogen receptor positive status [ER+]: Secondary | ICD-10-CM

## 2018-07-10 DIAGNOSIS — C50919 Malignant neoplasm of unspecified site of unspecified female breast: Secondary | ICD-10-CM

## 2018-07-10 LAB — CBC WITH DIFFERENTIAL (CANCER CENTER ONLY)
BASOS ABS: 0 10*3/uL (ref 0.0–0.1)
BASOS PCT: 0 %
Eosinophils Absolute: 0 10*3/uL (ref 0.0–0.5)
Eosinophils Relative: 0 %
HCT: 32.7 % — ABNORMAL LOW (ref 34.8–46.6)
Hemoglobin: 11.5 g/dL — ABNORMAL LOW (ref 11.6–15.9)
Lymphocytes Relative: 26 %
Lymphs Abs: 1.9 10*3/uL (ref 0.9–3.3)
MCH: 28 pg (ref 25.1–34.0)
MCHC: 35.2 g/dL (ref 31.5–36.0)
MCV: 79.6 fL (ref 79.5–101.0)
Monocytes Absolute: 0.6 10*3/uL (ref 0.1–0.9)
Monocytes Relative: 7 %
NEUTROS PCT: 67 %
Neutro Abs: 4.9 10*3/uL (ref 1.5–6.5)
Platelet Count: 249 10*3/uL (ref 145–400)
RBC: 4.11 MIL/uL (ref 3.70–5.45)
RDW: 14.4 % (ref 11.2–14.5)
WBC Count: 7.5 10*3/uL (ref 3.9–10.3)

## 2018-07-10 LAB — CMP (CANCER CENTER ONLY)
ALBUMIN: 3.9 g/dL (ref 3.5–5.0)
ALT: 7 U/L (ref 0–44)
AST: 14 U/L — AB (ref 15–41)
Alkaline Phosphatase: 41 U/L (ref 38–126)
Anion gap: 10 (ref 5–15)
BILIRUBIN TOTAL: 0.3 mg/dL (ref 0.3–1.2)
BUN: 5 mg/dL — AB (ref 6–20)
CHLORIDE: 104 mmol/L (ref 98–111)
CO2: 28 mmol/L (ref 22–32)
CREATININE: 0.81 mg/dL (ref 0.44–1.00)
Calcium: 9.6 mg/dL (ref 8.9–10.3)
GFR, Est AFR Am: >60 mL/min (ref >60)
GFR, Estimated: >60 mL/min (ref >60)
GLUCOSE: 82 mg/dL (ref 70–99)
Potassium: 4 mmol/L (ref 3.5–5.1)
Sodium: 142 mmol/L (ref 135–145)
Total Protein: 7.3 g/dL (ref 6.5–8.1)

## 2018-07-11 ENCOUNTER — Telehealth: Payer: Self-pay | Admitting: Oncology

## 2018-07-11 NOTE — Telephone Encounter (Signed)
Per 9/3 los,made appt for Sept 2020.  Mailed calendar.

## 2018-07-17 ENCOUNTER — Other Ambulatory Visit: Payer: Self-pay | Admitting: Oncology

## 2018-08-29 ENCOUNTER — Telehealth: Payer: Self-pay | Admitting: *Deleted

## 2018-08-29 ENCOUNTER — Other Ambulatory Visit: Payer: Self-pay | Admitting: Oncology

## 2018-08-29 DIAGNOSIS — Z170 Estrogen receptor positive status [ER+]: Principal | ICD-10-CM

## 2018-08-29 DIAGNOSIS — C50411 Malignant neoplasm of upper-outer quadrant of right female breast: Secondary | ICD-10-CM

## 2018-08-29 NOTE — Progress Notes (Unsigned)
Olivia Frederick stopped in today because she has noted a change in the upper area above the left reconstructed breast.  Incidentally she did a fantastic job at the [facility] this past weekend.  The area in question is above the implant right around the middle area of the implant superiorly, may be a centimeter above the implant and it is a 2 to 3 mm firm subcutaneous spot.  This requires evaluation.  I am setting her up initially for an ultrasound.  She may need an MRI for further evaluation

## 2018-08-29 NOTE — Telephone Encounter (Signed)
This RN returned VM left by pt stating she was on hold for " over 10 minutes waiting to schedule an appointment"  Per discussion- Olivia Frederick states she spoke with a doctor this weekend due to " I found a new place on my breast that concerns me and I was told to call and make an appointment "  This RN discussed above - offered for pt to " drop in " for quick check for possible need for scan and then a dedicated visit or a dedicated visit that would allow her to ask more questions and be more in depth but may result in need for scan as well.  Olivia Frederick states she would like to " drop in " today for quick check and advice.  MD made aware of above.

## 2018-09-04 ENCOUNTER — Ambulatory Visit: Payer: 59 | Admitting: Oncology

## 2018-09-18 ENCOUNTER — Other Ambulatory Visit: Payer: Self-pay | Admitting: Oncology

## 2018-09-18 ENCOUNTER — Ambulatory Visit
Admission: RE | Admit: 2018-09-18 | Discharge: 2018-09-18 | Disposition: A | Discharge status: Home / Self Care | Payer: 59 | Source: Ambulatory Visit | Attending: Oncology | Admitting: Oncology

## 2018-09-18 DIAGNOSIS — C50411 Malignant neoplasm of upper-outer quadrant of right female breast: Secondary | ICD-10-CM

## 2018-09-18 DIAGNOSIS — Z170 Estrogen receptor positive status [ER+]: Principal | ICD-10-CM

## 2018-09-18 DIAGNOSIS — N6320 Unspecified lump in the left breast, unspecified quadrant: Secondary | ICD-10-CM

## 2018-09-25 ENCOUNTER — Ambulatory Visit
Admission: RE | Admit: 2018-09-25 | Discharge: 2018-09-25 | Disposition: A | Discharge status: Home / Self Care | Payer: 59 | Source: Ambulatory Visit | Attending: Oncology | Admitting: Oncology

## 2018-09-25 ENCOUNTER — Other Ambulatory Visit: Payer: Self-pay | Admitting: Oncology

## 2018-09-25 DIAGNOSIS — Z170 Estrogen receptor positive status [ER+]: Principal | ICD-10-CM

## 2018-09-25 DIAGNOSIS — C50411 Malignant neoplasm of upper-outer quadrant of right female breast: Secondary | ICD-10-CM

## 2018-09-25 DIAGNOSIS — N6320 Unspecified lump in the left breast, unspecified quadrant: Secondary | ICD-10-CM

## 2018-10-19 ENCOUNTER — Other Ambulatory Visit: Payer: Self-pay | Admitting: Oncology

## 2018-12-21 ENCOUNTER — Other Ambulatory Visit: Payer: Self-pay | Admitting: Oncology

## 2018-12-21 DIAGNOSIS — N6320 Unspecified lump in the left breast, unspecified quadrant: Secondary | ICD-10-CM

## 2019-01-07 ENCOUNTER — Encounter: Payer: Self-pay | Admitting: General Practice

## 2019-01-07 NOTE — Progress Notes (Signed)
[facility] Spiritual Care Note  Met with Olivia Frederick and her son [name] today for emotional support, with an eye toward making a referral for formal counseling at their request. In the meantime, we put two more Spiritual Care appts on the calendar to keep momentum going.   [place], [place], [facility] Pager [phone] Voicemail [phone]

## 2019-01-18 ENCOUNTER — Other Ambulatory Visit: Payer: Self-pay | Admitting: Oncology

## 2019-01-21 ENCOUNTER — Encounter: Payer: 59 | Admitting: Internal Medicine

## 2019-03-25 NOTE — Progress Notes (Signed)
Subjective:    Patient ID: Olivia Frederick, female    DOB: [birth year], 48 y.o.   MRN: [id number]  HPI She is here for a physical exam.   Left lower eye lid she has a stye.  It does not itch or hurt.  It started three months ago and has not change in size.    She has no other concerns or questions.    Medications and allergies reviewed with patient and updated if appropriate.  Patient Active Problem List   Diagnosis Date Noted  . Allergic rhinitis 01/22/2018  . Family history of diabetes mellitus (DM) 01/22/2018  . CHEK2-related breast cancer ([facility]) 01/18/2018  . Breast cancer, right ([facility]) 06/27/2017  . Genetic testing 04/24/2017  . Monoallelic mutation of CHEK2 gene in female patient 04/24/2017  . Malignant neoplasm of upper-outer quadrant of right breast in female, estrogen receptor positive ([facility]) 02/02/2017    Current Outpatient Medications on File Prior to Visit  Medication Sig Dispense Refill  . tamoxifen (NOLVADEX) 20 MG tablet TAKE 1 TABLET(20 MG) BY MOUTH DAILY 90 tablet 0  . [DISCONTINUED] prochlorperazine (COMPAZINE) 10 MG tablet Take 1 tablet (10 mg total) by mouth every 6 (six) hours as needed (Nausea or vomiting). 30 tablet 1   No current facility-administered medications on file prior to visit.     Past Medical History:  Diagnosis Date  . Genetic testing 04/24/2017   [name] underwent genetic counseling and testing for hereditary cancer syndromes on 03/08/2017. 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   The remaining 45 genes analyzed by [facility]'s 46-gene Common Hereditary Cancers Panel were negative for mutations. Genes analyzed include: APC, ATM, AXIN2, BARD1, BMPR1A, BRCA1, BRCA2, BRIP1, CDH1, CDKN2  . History of breast cancer 2018   right  . History of chemotherapy    finished chemo 05/2017    Past Surgical History:  Procedure Laterality Date  . BREAST RECONSTRUCTION WITH PLACEMENT OF  TISSUE EXPANDER AND FLEX HD (ACELLULAR HYDRATED DERMIS) Bilateral 06/27/2017   Procedure: BILATERAL BREAST RECONSTRUCTION WITH PLACEMENT OF TISSUE EXPANDER AND ALLODERM;  Surgeon: Irene Limbo, MD;  Location: [facility];  Service: Plastics;  Laterality: Bilateral;  . CESAREAN SECTION     x 2  . NIPPLE SPARING MASTECTOMY/SENTINAL LYMPH NODE BIOPSY/RECONSTRUCTION/PLACEMENT OF TISSUE EXPANDER Bilateral 06/27/2017   Procedure: BILATERAL NIPPLE SPARING MASTECTOMY WITH RIGHT SENTINAL LYMPH NODE BIOPSY;  Surgeon: Excell Seltzer, MD;  Location: [facility];  Service: General;  Laterality: Bilateral;  . REMOVAL OF BILATERAL TISSUE EXPANDERS WITH PLACEMENT OF BILATERAL BREAST IMPLANTS Bilateral 10/02/2017   Procedure: REMOVAL OF BILATERAL TISSUE EXPANDERS WITH PLACEMENT OF BILATERAL BREAST IMPLANTS;  Surgeon: Irene Limbo, MD;  Location: [facility];  Service: Plastics;  Laterality: Bilateral;    Social History   Socioeconomic History  . Marital status: Married    Spouse name: Not on file  . Number of children: 2  . Years of education: Not on file  . Highest education level: Not on file  Occupational History  . Not on file  Social Needs  . Financial resource strain: Not on file  . Food insecurity:    Worry: Not on file    Inability: Not on file  . Transportation needs:    Medical: Not on file    Non-medical: Not on file  Tobacco Use  . Smoking status: Former Smoker    Last attempt to quit: 02/01/2017    Years since quitting: 2.1  .  Smokeless tobacco: Never Used  . Tobacco comment: social smoking for 20 years  Substance and Sexual Activity  . Alcohol use: Yes    Comment: 6 drinks/week  . Drug use: No  . Sexual activity: Not on file  Lifestyle  . Physical activity:    Days per week: Not on file    Minutes per session: Not on file  . Stress: Not on file  Relationships  . Social connections:    Talks on phone: Not on file    Gets  together: Not on file    Attends religious service: Not on file    Active member of club or organization: Not on file    Attends meetings of clubs or organizations: Not on file    Relationship status: Not on file  Other Topics Concern  . Not on file  Social History Narrative   [place]   2 kids 84 (Zach), 10 ( gabby)      Irregular exercise -- walking/running with daughter    Family History  Problem Relation Age of Onset  . Hypertension Mother   . Breast cancer Mother        diagnosed after her  . Thyroid disease Mother   . Other Mother        carotid artery dis  . Diabetes Father   . Leukemia Paternal Grandfather        d.64  . Breast cancer Cousin 40       Bilateral breast cancer. Daughter of paternal uncle with leukemia.  . Leukemia Paternal Uncle 82  . Lung cancer Maternal Uncle 74  . Diabetes Brother   . Brain cancer Maternal Aunt 65       d.66  . Cancer Maternal Uncle        unspecified type  . Brain cancer Cousin 65       d.55 daughter of maternal aunt with brain cancer    Review of Systems  Constitutional: Negative for chills and fever.  Eyes: Negative for visual disturbance.  Respiratory: Negative for cough, shortness of breath and wheezing.   Cardiovascular: Negative for chest pain, palpitations and leg swelling.  Gastrointestinal: Negative for abdominal pain, blood in stool, constipation, diarrhea and nausea.       No gerd  Genitourinary: Negative for dysuria and hematuria.  Musculoskeletal: Positive for arthralgias (stiffness of some joints). Negative for back pain.  Skin: Negative for color change and rash.  Neurological: Negative for light-headedness and headaches.  Psychiatric/Behavioral: Negative for dysphoric mood and sleep disturbance. The patient is not nervous/anxious.        Objective:   Vitals:   03/26/19 1324  BP: 126/78  Pulse: 85  Resp: 16  Temp: 98.5 F (36.9 C)  SpO2: 99%   Filed Weights   03/26/19 1324   Weight: 133 lb (60.3 kg)   Body mass index is 21.47 kg/m.  BP Readings from Last 3 Encounters:  03/26/19 126/78  07/10/18 125/78  03/06/18 119/62    Wt Readings from Last 3 Encounters:  03/26/19 133 lb (60.3 kg)  07/10/18 143 lb 4.8 oz (65 kg)  03/06/18 141 lb 4 oz (64.1 kg)     Physical Exam Constitutional: She appears well-developed and well-nourished. No distress.  HENT:  Head: Normocephalic and atraumatic.  Right Ear: External ear normal. Normal ear canal and TM Left Ear: External ear normal.  Normal ear canal and TM Mouth/Throat: Oropharynx is clear and moist.  Eyes: Conjunctivae and EOM are normal.  Stye in left lower eye lid Neck: Neck supple. No tracheal deviation present. No thyromegaly present. No carotid bruit  Cardiovascular: Normal rate, regular rhythm and normal heart sounds.   No murmur heard.  No edema. Pulmonary/Chest: Effort normal and breath sounds normal. No respiratory distress. She has no wheezes. She has no rales.  Breast: deferred   Abdominal: Soft. She exhibits no distension. There is no tenderness.  Lymphadenopathy: She has no cervical adenopathy.  Skin: Skin is warm and dry. She is not diaphoretic.  Psychiatric: She has a normal mood and affect. Her behavior is normal.        Assessment & Plan:   Physical exam: Screening blood work ordered Immunizations  tdap due - will check with gyn Mammogram  N/ A  - b/l mastectomy Gyn  Has appt next week Eye exams  Up to date Exercise  Walks 3-4 times a week Weight   Normal BMI Skin no concerns Substance abuse   none  See Problem List for Assessment and Plan of chronic medical problems.

## 2019-03-25 NOTE — Patient Instructions (Addendum)
Tests ordered today. Your results will be released to [place] (or called to you) after review, usually within 72hours after test completion. If any changes need to be made, you will be notified at that same time.  All other Health Maintenance issues reviewed.   All recommended immunizations and age-appropriate screenings are up-to-date or discussed.  No immunizations administered today.   Medications reviewed and updated.  Changes include :   Antibacterial eye drop for your stye.    Your prescription(s) have been submitted to your pharmacy. Please take as directed and contact our office if you believe you are having problem(s) with the medication(s).   Please followup in one year    Health Maintenance, Female Adopting a healthy lifestyle and getting preventive care can go a long way to promote health and wellness. Talk with your health care provider about what schedule of regular examinations is right for you. This is a good chance for you to check in with your provider about disease prevention and staying healthy. In between checkups, there are plenty of things you can do on your own. Experts have done a lot of research about which lifestyle changes and preventive measures are most likely to keep you healthy. Ask your health care provider for more information. Weight and diet Eat a healthy diet  Be sure to include plenty of vegetables, fruits, low-fat dairy products, and lean protein.  Do not eat a lot of foods high in solid fats, added sugars, or salt.  Get regular exercise. This is one of the most important things you can do for your health. ? Most adults should exercise for at least 150 minutes each week. The exercise should increase your heart rate and make you sweat (moderate-intensity exercise). ? Most adults should also do strengthening exercises at least twice a week. This is in addition to the moderate-intensity exercise. Maintain a healthy weight  Body mass index (BMI) is a  measurement that can be used to identify possible weight problems. It estimates body fat based on height and weight. Your health care provider can help determine your BMI and help you achieve or maintain a healthy weight.  For females 62 years of age and older: ? A BMI below 18.5 is considered underweight. ? A BMI of 18.5 to 24.9 is normal. ? A BMI of 25 to 29.9 is considered overweight. ? A BMI of 30 and above is considered obese. Watch levels of cholesterol and blood lipids  You should start having your blood tested for lipids and cholesterol at 46 years of age, then have this test every 5 years.  You may need to have your cholesterol levels checked more often if: ? Your lipid or cholesterol levels are high. ? You are older than 46 years of age. ? You are at high risk for heart disease. Cancer screening Lung Cancer  Lung cancer screening is recommended for adults 4-44 years old who are at high risk for lung cancer because of a history of smoking.  A yearly low-dose CT scan of the lungs is recommended for people who: ? Currently smoke. ? Have quit within the past 15 years. ? Have at least a 30-pack-year history of smoking. A pack year is smoking an average of one pack of cigarettes a day for 1 year.  Yearly screening should continue until it has been 15 years since you quit.  Yearly screening should stop if you develop a health problem that would prevent you from having lung cancer treatment. Breast Cancer  Practice breast self-awareness. This means understanding how your breasts normally appear and feel.  It also means doing regular breast self-exams. Let your health care provider know about any changes, no matter how small.  If you are in your 20s or 30s, you should have a clinical breast exam (CBE) by a health care provider every 1-3 years as part of a regular health exam.  If you are 57 or older, have a CBE every year. Also consider having a breast X-ray (mammogram) every  year.  If you have a family history of breast cancer, talk to your health care provider about genetic screening.  If you are at high risk for breast cancer, talk to your health care provider about having an MRI and a mammogram every year.  Breast cancer gene (BRCA) assessment is recommended for women who have family members with BRCA-related cancers. BRCA-related cancers include: ? Breast. ? Ovarian. ? Tubal. ? Peritoneal cancers.  Results of the assessment will determine the need for genetic counseling and BRCA1 and BRCA2 testing. Cervical Cancer Your health care provider may recommend that you be screened regularly for cancer of the pelvic organs (ovaries, uterus, and vagina). This screening involves a pelvic examination, including checking for microscopic changes to the surface of your cervix (Pap test). You may be encouraged to have this screening done every 3 years, beginning at age 66.  For women ages 20-65, health care providers may recommend pelvic exams and Pap testing every 3 years, or they may recommend the Pap and pelvic exam, combined with testing for human papilloma virus (HPV), every 5 years. Some types of HPV increase your risk of cervical cancer. Testing for HPV may also be done on women of any age with unclear Pap test results.  Other health care providers may not recommend any screening for nonpregnant women who are considered low risk for pelvic cancer and who do not have symptoms. Ask your health care provider if a screening pelvic exam is right for you.  If you have had past treatment for cervical cancer or a condition that could lead to cancer, you need Pap tests and screening for cancer for at least 20 years after your treatment. If Pap tests have been discontinued, your risk factors (such as having a new sexual partner) need to be reassessed to determine if screening should resume. Some women have medical problems that increase the chance of getting cervical cancer. In  these cases, your health care provider may recommend more frequent screening and Pap tests. Colorectal Cancer  This type of cancer can be detected and often prevented.  Routine colorectal cancer screening usually begins at 46 years of age and continues through 46 years of age.  Your health care provider may recommend screening at an earlier age if you have risk factors for colon cancer.  Your health care provider may also recommend using home test kits to check for hidden blood in the stool.  A small camera at the end of a tube can be used to examine your colon directly (sigmoidoscopy or colonoscopy). This is done to check for the earliest forms of colorectal cancer.  Routine screening usually begins at age 32.  Direct examination of the colon should be repeated every 5-10 years through 46 years of age. However, you may need to be screened more often if early forms of precancerous polyps or small growths are found. Skin Cancer  Check your skin from head to toe regularly.  Tell your health care provider about any  new moles or changes in moles, especially if there is a change in a mole's shape or color.  Also tell your health care provider if you have a mole that is larger than the size of a pencil eraser.  Always use sunscreen. Apply sunscreen liberally and repeatedly throughout the day.  Protect yourself by wearing long sleeves, pants, a wide-brimmed hat, and sunglasses whenever you are outside. Heart disease, diabetes, and high blood pressure  High blood pressure causes heart disease and increases the risk of stroke. High blood pressure is more likely to develop in: ? People who have blood pressure in the high end of the normal range (130-139/85-89 mm Hg). ? People who are overweight or obese. ? People who are African American.  If you are 68-58 years of age, have your blood pressure checked every 3-5 years. If you are 60 years of age or older, have your blood pressure checked  every year. You should have your blood pressure measured twice-once when you are at a hospital or clinic, and once when you are not at a hospital or clinic. Record the average of the two measurements. To check your blood pressure when you are not at a hospital or clinic, you can use: ? An automated blood pressure machine at a pharmacy. ? A home blood pressure monitor.  If you are between 53 years and 56 years old, ask your health care provider if you should take aspirin to prevent strokes.  Have regular diabetes screenings. This involves taking a blood sample to check your fasting blood sugar level. ? If you are at a normal weight and have a low risk for diabetes, have this test once every three years after 46 years of age. ? If you are overweight and have a high risk for diabetes, consider being tested at a younger age or more often. Preventing infection Hepatitis B  If you have a higher risk for hepatitis B, you should be screened for this virus. You are considered at high risk for hepatitis B if: ? You were born in a country where hepatitis B is common. Ask your health care provider which countries are considered high risk. ? Your parents were born in a high-risk country, and you have not been immunized against hepatitis B (hepatitis B vaccine). ? You have HIV or AIDS. ? You use needles to inject street drugs. ? You live with someone who has hepatitis B. ? You have had sex with someone who has hepatitis B. ? You get hemodialysis treatment. ? You take certain medicines for conditions, including cancer, organ transplantation, and autoimmune conditions. Hepatitis C  Blood testing is recommended for: ? Everyone born from [birth year] through 1963. ? Anyone with known risk factors for hepatitis C. Sexually transmitted infections (STIs)  You should be screened for sexually transmitted infections (STIs) including gonorrhea and chlamydia if: ? You are sexually active and are younger than 46 years of  age. ? You are older than 46 years of age and your health care provider tells you that you are at risk for this type of infection. ? Your sexual activity has changed since you were last screened and you are at an increased risk for chlamydia or gonorrhea. Ask your health care provider if you are at risk.  If you do not have HIV, but are at risk, it may be recommended that you take a prescription medicine daily to prevent HIV infection. This is called pre-exposure prophylaxis (PrEP). You are considered at risk if: ?  You are sexually active and do not regularly use condoms or know the HIV status of your partner(s). ? You take drugs by injection. ? You are sexually active with a partner who has HIV. Talk with your health care provider about whether you are at high risk of being infected with HIV. If you choose to begin PrEP, you should first be tested for HIV. You should then be tested every 3 months for as long as you are taking PrEP. Pregnancy  If you are premenopausal and you may become pregnant, ask your health care provider about preconception counseling.  If you may become pregnant, take 400 to 800 micrograms (mcg) of folic acid every day.  If you want to prevent pregnancy, talk to your health care provider about birth control (contraception). Osteoporosis and menopause  Osteoporosis is a disease in which the bones lose minerals and strength with aging. This can result in serious bone fractures. Your risk for osteoporosis can be identified using a bone density scan.  If you are 22 years of age or older, or if you are at risk for osteoporosis and fractures, ask your health care provider if you should be screened.  Ask your health care provider whether you should take a calcium or vitamin D supplement to lower your risk for osteoporosis.  Menopause may have certain physical symptoms and risks.  Hormone replacement therapy may reduce some of these symptoms and risks. Talk to your health  care provider about whether hormone replacement therapy is right for you. Follow these instructions at home:  Schedule regular health, dental, and eye exams.  Stay current with your immunizations.  Do not use any tobacco products including cigarettes, chewing tobacco, or electronic cigarettes.  If you are pregnant, do not drink alcohol.  If you are breastfeeding, limit how much and how often you drink alcohol.  Limit alcohol intake to no more than 1 drink per day for nonpregnant women. One drink equals 12 ounces of beer, 5 ounces of wine, or 1 ounces of hard liquor.  Do not use street drugs.  Do not share needles.  Ask your health care provider for help if you need support or information about quitting drugs.  Tell your health care provider if you often feel depressed.  Tell your health care provider if you have ever been abused or do not feel safe at home. This information is not intended to replace advice given to you by your health care provider. Make sure you discuss any questions you have with your health care provider. Document Released: 05/09/2011 Document Revised: 03/31/2016 Document Reviewed: 07/28/2015 Elsevier Interactive Patient Education  2019 [employer].

## 2019-03-26 ENCOUNTER — Encounter: Payer: Self-pay | Admitting: Internal Medicine

## 2019-03-26 ENCOUNTER — Other Ambulatory Visit (INDEPENDENT_AMBULATORY_CARE_PROVIDER_SITE_OTHER): Payer: 59

## 2019-03-26 ENCOUNTER — Ambulatory Visit (INDEPENDENT_AMBULATORY_CARE_PROVIDER_SITE_OTHER): Payer: 59 | Admitting: Internal Medicine

## 2019-03-26 ENCOUNTER — Other Ambulatory Visit: Payer: Self-pay

## 2019-03-26 VITALS — BP 126/78 | HR 85 | Temp 98.5°F | Resp 16 | Ht 66.0 in | Wt 133.0 lb

## 2019-03-26 DIAGNOSIS — Z833 Family history of diabetes mellitus: Secondary | ICD-10-CM

## 2019-03-26 DIAGNOSIS — Z0000 Encounter for general adult medical examination without abnormal findings: Secondary | ICD-10-CM

## 2019-03-26 DIAGNOSIS — H00015 Hordeolum externum left lower eyelid: Secondary | ICD-10-CM

## 2019-03-26 DIAGNOSIS — H00019 Hordeolum externum unspecified eye, unspecified eyelid: Secondary | ICD-10-CM | POA: Insufficient documentation

## 2019-03-26 LAB — CBC WITH DIFFERENTIAL/PLATELET
Basophils Absolute: 0 10*3/uL (ref 0.0–0.1)
Basophils Relative: 0.5 % (ref 0.0–3.0)
Eosinophils Absolute: 0.1 10*3/uL (ref 0.0–0.7)
Eosinophils Relative: 0.6 % (ref 0.0–5.0)
HCT: 38.1 % (ref 36.0–46.0)
Hemoglobin: 13.4 g/dL (ref 12.0–15.0)
Lymphocytes Relative: 21.7 % (ref 12.0–46.0)
Lymphs Abs: 2.1 10*3/uL (ref 0.7–4.0)
MCHC: 35.2 g/dL (ref 30.0–36.0)
MCV: 80.7 fl (ref 78.0–100.0)
Monocytes Absolute: 0.7 10*3/uL (ref 0.1–1.0)
Monocytes Relative: 6.9 % (ref 3.0–12.0)
Neutro Abs: 6.7 10*3/uL (ref 1.4–7.7)
Neutrophils Relative %: 70.3 % (ref 43.0–77.0)
Platelets: 246 10*3/uL (ref 150.0–400.0)
RBC: 4.72 Mil/uL (ref 3.87–5.11)
RDW: 13.7 % (ref 11.5–15.5)
WBC: 9.6 10*3/uL (ref 4.0–10.5)

## 2019-03-26 LAB — COMPREHENSIVE METABOLIC PANEL
ALT: 8 U/L (ref 0–35)
AST: 13 U/L (ref 0–37)
Albumin: 4.5 g/dL (ref 3.5–5.2)
Alkaline Phosphatase: 35 U/L — ABNORMAL LOW (ref 39–117)
BUN: 8 mg/dL (ref 6–23)
CO2: 26 mEq/L (ref 19–32)
Calcium: 9.6 mg/dL (ref 8.4–10.5)
Chloride: 103 mEq/L (ref 96–112)
Creatinine, Ser: 0.8 mg/dL (ref 0.40–1.20)
GFR: 93.64 mL/min (ref >60.00)
Glucose, Bld: 115 mg/dL — ABNORMAL HIGH (ref 70–99)
Potassium: 3.7 mEq/L (ref 3.5–5.1)
Sodium: 138 mEq/L (ref 135–145)
Total Bilirubin: 0.7 mg/dL (ref 0.2–1.2)
Total Protein: 7.8 g/dL (ref 6.0–8.3)

## 2019-03-26 LAB — LIPID PANEL
Cholesterol: 170 mg/dL (ref 0–200)
HDL: 64.5 mg/dL (ref >39.00)
LDL Cholesterol: 88 mg/dL (ref 0–99)
NonHDL: 105.24
Total CHOL/HDL Ratio: 3
Triglycerides: 86 mg/dL (ref 0.0–149.0)
VLDL: 17.2 mg/dL (ref 0.0–40.0)

## 2019-03-26 LAB — HEMOGLOBIN A1C: Hgb A1c MFr Bld: 5.5 % (ref 4.6–6.5)

## 2019-03-26 LAB — TSH: TSH: 1.02 u[IU]/mL (ref 0.35–4.50)

## 2019-03-26 MED ORDER — POLYMYXIN B-TRIMETHOPRIM 10000-0.1 UNIT/ML-% OP SOLN: 1.0000 [drp] | OPHTHALMIC | 0 refills | Status: DC

## 2019-03-27 ENCOUNTER — Other Ambulatory Visit: Payer: 59

## 2019-03-28 ENCOUNTER — Other Ambulatory Visit: Payer: Self-pay

## 2019-03-28 ENCOUNTER — Ambulatory Visit
Admission: RE | Admit: 2019-03-28 | Discharge: 2019-03-28 | Disposition: A | Discharge status: Home / Self Care | Payer: 59 | Source: Ambulatory Visit | Attending: Oncology | Admitting: Oncology

## 2019-03-28 DIAGNOSIS — N6320 Unspecified lump in the left breast, unspecified quadrant: Secondary | ICD-10-CM

## 2019-04-15 ENCOUNTER — Other Ambulatory Visit: Payer: Self-pay | Admitting: Oncology

## 2019-07-19 ENCOUNTER — Other Ambulatory Visit: Payer: Self-pay | Admitting: *Deleted

## 2019-07-19 DIAGNOSIS — Z170 Estrogen receptor positive status [ER+]: Secondary | ICD-10-CM

## 2019-07-19 DIAGNOSIS — C50411 Malignant neoplasm of upper-outer quadrant of right female breast: Secondary | ICD-10-CM

## 2019-07-19 NOTE — Progress Notes (Signed)
No entry 

## 2019-07-20 ENCOUNTER — Other Ambulatory Visit: Payer: Self-pay | Admitting: Oncology

## 2019-07-20 DIAGNOSIS — Z1502 Genetic susceptibility to malignant neoplasm of ovary: Secondary | ICD-10-CM

## 2019-07-20 DIAGNOSIS — Z170 Estrogen receptor positive status [ER+]: Secondary | ICD-10-CM

## 2019-07-20 DIAGNOSIS — C50919 Malignant neoplasm of unspecified site of unspecified female breast: Secondary | ICD-10-CM

## 2019-07-20 DIAGNOSIS — C50911 Malignant neoplasm of unspecified site of right female breast: Secondary | ICD-10-CM

## 2019-07-20 DIAGNOSIS — C50411 Malignant neoplasm of upper-outer quadrant of right female breast: Secondary | ICD-10-CM

## 2019-07-21 NOTE — Progress Notes (Signed)
[place]  [phone] [phone]     ID: Olivia Frederick DOB: September 08, 1971  MR#: [id number]  WIO#:[id number]  Patient Care Team: Binnie Rail, MD as PCP - General (Internal Medicine) Excell Seltzer, MD as Consulting Physician (General Surgery) Julianne Chamberlin, Virgie Dad, MD as Consulting Physician (Oncology) Gery Pray, MD as Consulting Physician (Radiation Oncology) Juanita Craver, MD as Consulting Physician (Gastroenterology) Delice Bison, Charlestine Massed, NP as Nurse Practitioner (Hematology and Oncology) OTHER MD:   CHIEF COMPLAINT: Triple positive breast cancer, s/p bilateral mastectomies  CURRENT TREATMENT: tamoxifen   BREAST CANCER HISTORY: From the original intake note:  Olivia Frederick noted a change in her right breast around November 2017. She was not very concerned about it but did mention it at the time of mammography which had been scheduled for 01/30/2017. Accordingly she had bilateral diagnostic mammography with tomography and right breast ultrasonography that day at the [facility]. In the right breast there was an irregular mass measuring approximately 5 cm. On exam this was firm and palpable at the 12:00 location 2 cm from the nipple targeted ultrasonography showed an area of mixed echogenicity superiorly measuring at least 4.6 cm with a similar appearing adjacent area measuring 1.3 cm thought to be contiguous ultrasound of the right axilla showed one ymph node with cortical thickening.  Biopsy of the right breast mass 01/31/2017 showed ([id number]) invasive ductal carcinoma, grade 2, estrogen receptor 100% positive, with strong staining intensity, progesterone receptor 25% positive, with moderate staining intensity, with an MIB-1 of 25%, and HER-2 amplified with a signals ratio of 2.35, the number per cell being 4.0. Biopsy of the suspicious axillary lymph node showed only reactive  Her subsequent history is as detailed below   INTERVAL  HISTORY: Olivia Frederick returns today for follow-up and treatment of her estrogen receptor positive breast cancer. She was last seen here on 07/10/2018.   She continues on tamoxifen.  She tolerates this well, with no significant hot flashes or other problems.  Initially the tamoxifen made her periods are regular but her periods are now again regular.  She understands tamoxifen is not a contraceptive.  She is not using contraception at this point  Since her last visit here, she underwent an ultrasound of the left breast on 09/18/2018 showing: On physical exam, I palpate a firm, mobile subcentimeter mass just below the skin at the 11:30 position in the left mastectomy bed. Targeted ultrasound is performed, showing a round, hypoechoic mass with circumscribed and ill-defined borders at the 11:30 position 8 cm from the nipple. It measures 5 x 5 x 5 mm. There is no definite associated vascularity. Evaluation of the left axilla demonstrates morphologically normal lymph nodes. No suspicious adenopathy is identified.  She underwent a biopsy of the left breast area in question on 09/25/2018. The pathology from this procedure showed [id number]): Breast, left, needle core biopsy, 11:30 o'clock - scar, consistent with previous procedure. - skeletal muscle.   - there is no evidence of malignancy.  Finally, she underwent an ultrasound of the left breast on 03/28/2019 showing: Targeted ultrasound is performed, showing an oval hypoechoic mass at 11:30 position 8 cm from the nipple measuring 0.3 x 0.4 x 0.2 cm. This has decreased in size since November 2019 where before the biopsy it measured 0.5 x 0.5 x 0.5 cm. Imaging findings are consistent with the benign pathology results.   REVIEW OF SYSTEMS: Olivia Frederick is working from home.  She enjoys it.  Her husband runs a [employer], is  very busy, but is also careful.  She walks 2-4 times a week, 30 to 45 minutes at a time for exercise.  A detailed review of systems today was  otherwise noncontributory   PAST MEDICAL HISTORY: Past Medical History:  Diagnosis Date  . Genetic testing 04/24/2017   [name] underwent genetic counseling and testing for hereditary cancer syndromes on 03/08/2017. 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   The remaining 45 genes analyzed by [facility]'s 46-gene Common Hereditary Cancers Panel were negative for mutations. Genes analyzed include: APC, ATM, AXIN2, BARD1, BMPR1A, BRCA1, BRCA2, BRIP1, CDH1, CDKN2  . History of breast cancer 2018   right  . History of chemotherapy    finished chemo 05/2017    PAST SURGICAL HISTORY: Past Surgical History:  Procedure Laterality Date  . BREAST RECONSTRUCTION WITH PLACEMENT OF TISSUE EXPANDER AND FLEX HD (ACELLULAR HYDRATED DERMIS) Bilateral 06/27/2017   Procedure: BILATERAL BREAST RECONSTRUCTION WITH PLACEMENT OF TISSUE EXPANDER AND ALLODERM;  Surgeon: Irene Limbo, MD;  Location: [facility];  Service: Plastics;  Laterality: Bilateral;  . CESAREAN SECTION     x 2  . NIPPLE SPARING MASTECTOMY/SENTINAL LYMPH NODE BIOPSY/RECONSTRUCTION/PLACEMENT OF TISSUE EXPANDER Bilateral 06/27/2017   Procedure: BILATERAL NIPPLE SPARING MASTECTOMY WITH RIGHT SENTINAL LYMPH NODE BIOPSY;  Surgeon: Excell Seltzer, MD;  Location: [facility];  Service: General;  Laterality: Bilateral;  . REMOVAL OF BILATERAL TISSUE EXPANDERS WITH PLACEMENT OF BILATERAL BREAST IMPLANTS Bilateral 10/02/2017   Procedure: REMOVAL OF BILATERAL TISSUE EXPANDERS WITH PLACEMENT OF BILATERAL BREAST IMPLANTS;  Surgeon: Irene Limbo, MD;  Location: [facility];  Service: Plastics;  Laterality: Bilateral;    FAMILY HISTORY Family History  Problem Relation Age of Onset  . Hypertension Mother   . Breast cancer Mother        diagnosed after her  . Thyroid disease Mother   . Other Mother        carotid artery dis  . Diabetes Father    . Leukemia Paternal Grandfather        d.64  . Breast cancer Cousin 40       Bilateral breast cancer. Daughter of paternal uncle with leukemia.  . Leukemia Paternal Uncle 75  . Lung cancer Maternal Uncle 74  . Diabetes Brother   . Brain cancer Maternal Aunt 65       d.66  . Cancer Maternal Uncle        unspecified type  . Brain cancer Cousin 54       d.55 daughter of maternal aunt with brain cancer  The patient's parents are living, both in their early 55s as of April 2018. She has one brother and one sister. On the paternal side a grandfather was diagnosed with leukemia at age 83, an uncle also with leukemia at age 47, another uncle at age 70 with colon cancer, and a cousin with breast cancer at age 41. On the mother's side there was an uncle with cancer of some type, not known to the patient, diagnosed age 77   GYNECOLOGIC HISTORY:  No LMP recorded. (Menstrual status: Chemotherapy).  Menarche age 43. She is GX P2, first live birth age 48.  Her periods were briefly interrupted with chemotherapy but resumed on October 2019. Her periods, again briefly stopped for 3 months starting in April 2019 and resumed July 2019. She never used oral contraceptives. She is currently using the rhythm method.   SOCIAL HISTORY:  She works as an  underwriter.  Her husband runs a [employer] manager . Son [name] is 53, daughter Janace Hoard is 37, as of April 2018.    ADVANCED DIRECTIVES: Not in place    HEALTH MAINTENANCE: Social History   Tobacco Use  . Smoking status: Former Smoker    Quit date: 02/01/2017    Years since quitting: 2.4  . Smokeless tobacco: Never Used  . Tobacco comment: social smoking for 20 years  Substance Use Topics  . Alcohol use: Yes    Comment: 3-4 drinks/week  . Drug use: No     Colonoscopy: n/a  PAP:  Bone density:   No Known Allergies  Current Outpatient Medications  Medication Sig Dispense Refill  . tamoxifen (NOLVADEX) 20 MG tablet TAKE 1 TABLET(20 MG) BY MOUTH DAILY 90  tablet 0  . trimethoprim-polymyxin b (POLYTRIM) ophthalmic solution Place 1 drop into the left eye every 4 (four) hours. 10 mL 0   No current facility-administered medications for this visit.     OBJECTIVE: Young white woman who appears well  Vitals:   07/22/19 1520  BP: 108/73  Pulse: (!) 59  Resp: 18  Temp: 98.7 F (37.1 C)  SpO2: 100%   Wt Readings from Last 3 Encounters:  07/22/19 140 lb 4.8 oz (63.6 kg)  03/26/19 133 lb (60.3 kg)  07/10/18 143 lb 4.8 oz (65 kg)   Body mass index is 22.65 kg/m.     ECOG FS: 0  Ocular: Sclerae unicteric, pupils round and equal Ear-nose-throat: Wearing a mask Lymphatic: No cervical or supraclavicular adenopathy Lungs no rales or rhonchi Heart regular rate and rhythm Abd soft, nontender, positive bowel sounds MSK no focal spinal tenderness, no joint edema Neuro: non-focal, well-oriented, appropriate affect Breasts: Status post bilateral nipple sparing mastectomies with silicone implant reconstruction.  There is no evidence of disease recurrence.  Both axillae are benign.  LAB RESULTS:  CMP     Component Value Date/Time   NA 138 03/26/2019 1400   NA 141 10/26/2017 1442   K 3.7 03/26/2019 1400   K 4.3 10/26/2017 1442   CL 103 03/26/2019 1400   CO2 26 03/26/2019 1400   CO2 26 10/26/2017 1442   GLUCOSE 115 (H) 03/26/2019 1400   GLUCOSE 91 10/26/2017 1442   BUN 8 03/26/2019 1400   BUN 8.1 10/26/2017 1442   CREATININE 0.80 03/26/2019 1400   CREATININE 0.81 07/10/2018 1504   CREATININE 0.8 10/26/2017 1442   CALCIUM 9.6 03/26/2019 1400   CALCIUM 9.4 10/26/2017 1442   PROT 7.8 03/26/2019 1400   PROT 7.3 10/26/2017 1442   ALBUMIN 4.5 03/26/2019 1400   ALBUMIN 4.0 10/26/2017 1442   AST 13 03/26/2019 1400   AST 14 (L) 07/10/2018 1504   AST 13 10/26/2017 1442   ALT 8 03/26/2019 1400   ALT 7 07/10/2018 1504   ALT 8 10/26/2017 1442   ALKPHOS 35 (L) 03/26/2019 1400   ALKPHOS 49 10/26/2017 1442   BILITOT 0.7 03/26/2019 1400    BILITOT 0.3 07/10/2018 1504   BILITOT 0.31 10/26/2017 1442   GFRNONAA >60 07/10/2018 1504   GFRAA >60 07/10/2018 1504    No results found for: TOTALPROTELP, ALBUMINELP, A1GS, A2GS, BETS, BETA2SER, GAMS, MSPIKE, SPEI  No results found for: KPAFRELGTCHN, LAMBDASER, KAPLAMBRATIO  Lab Results  Component Value Date   WBC 8.9 07/22/2019   NEUTROABS 5.2 07/22/2019   HGB 11.6 (L) 07/22/2019   HCT 33.4 (L) 07/22/2019   MCV 81.1 07/22/2019   PLT 252 07/22/2019  Chemistry      Component Value Date/Time   NA 138 03/26/2019 1400   NA 141 10/26/2017 1442   K 3.7 03/26/2019 1400   K 4.3 10/26/2017 1442   CL 103 03/26/2019 1400   CO2 26 03/26/2019 1400   CO2 26 10/26/2017 1442   BUN 8 03/26/2019 1400   BUN 8.1 10/26/2017 1442   CREATININE 0.80 03/26/2019 1400   CREATININE 0.81 07/10/2018 1504   CREATININE 0.8 10/26/2017 1442      Component Value Date/Time   CALCIUM 9.6 03/26/2019 1400   CALCIUM 9.4 10/26/2017 1442   ALKPHOS 35 (L) 03/26/2019 1400   ALKPHOS 49 10/26/2017 1442   AST 13 03/26/2019 1400   AST 14 (L) 07/10/2018 1504   AST 13 10/26/2017 1442   ALT 8 03/26/2019 1400   ALT 7 07/10/2018 1504   ALT 8 10/26/2017 1442   BILITOT 0.7 03/26/2019 1400   BILITOT 0.3 07/10/2018 1504   BILITOT 0.31 10/26/2017 1442       No results found for: LABCA2  No components found for: [id number]  No results for input(s): INR in the last 168 hours.  Urinalysis    Component Value Date/Time   COLORURINE yellow 01/22/2010 1457   APPEARANCEUR Clear 01/22/2010 1457   LABSPEC 1.015 01/22/2010 1457   PHURINE 5.0 01/22/2010 1457   GLUCOSEU NEGATIVE 05/24/2007 1404   HGBUR trace-lysed 01/22/2010 1457   BILIRUBINUR negative 01/22/2010 [place] 05/24/2007 1404   PROTEINUR NEGATIVE 05/24/2007 1404   UROBILINOGEN 0.2 01/22/2010 1457   NITRITE negative 01/22/2010 1457   LEUKOCYTESUR  05/24/2007 1404    NEGATIVE MICROSCOPIC NOT DONE ON URINES WITH NEGATIVE PROTEIN,  BLOOD, LEUKOCYTES, NITRITE, OR GLUCOSE <1000 mg/dL.     STUDIES: No results found.   ELIGIBLE FOR AVAILABLE RESEARCH PROTOCOL: No  ASSESSMENT: 48 y.o. CHEK2 positive [place] woman status post right breast biopsy 02/01/2017 for a cT2 pN0, stage IB invasive ductal carcinoma, grade 2, estrogen and progesterone receptor positive, with an MIB-1 of 25%, and HER-2 amplified   (a) biopsy of a suspicious right axillary lymph node 01/24/2017 showed only reactive lymphoid tissue  (b) biopsy of right breast lower outer quadrant 02/17/2017 showed invasive ductal carcinoma, grade 3, estrogen and progesterone receptor positive, HER-2 not amplified, with an Mib-1 of 50%  (1) genetics testing 46-gene Common Hereditary Cancer Panel offered by [facility] identified a single, heterozygous pathogenic gene mutation called CHEK2, c.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 There were no additional mutations in the remaining 45 genes analyzed: APC, ATM, AXIN2, BARD1, BMPR1A, BRCA1, BRCA2, BRIP1, CDH1, CDKN2A, CTNNA1, DICER1, EPCAM, GREM1, HOXB13, KIT, MEN1, MLH1, MSH2, MSH3, MSH6, MUTYH, NBN, NF1, NTHL1, PALB2, PDGFRA, PMS2, POLD1, POLE, PTEN, RAD50, RAD51C, RAD51D, SDHA, SDHB, SDHC, SDHD, SMAD4, SMARCA4, STK11, TP53, TSC1, TSC2, and VHL.  (a) will need either bilateral mastectomies or intensified screening  (b) colon cancer screening started 2018 (colonoscopy) under Dr. Collene Mares  (2) neoadjuvant chemotherapy with carboplatin, docetaxel, and trastuzumab with pertuzumab given every 3 weeks 6, starting 02/16/2017, completed 06/01/2017  (3) anti-HER-2 immunotherapy completed to total 1 year (through 02/08/2018)  (a) baseline echocardiogram 02/13/2017 found an ejection fraction of 55-60%  (b) echocardiogram 05/23/2017 found an ejection fraction of 55-60%.  (c) echo on 08/18/2017 demonstrates LVEF of 60-65%  (d) echo on 11/27/2017 showed an ejection fraction in the 55-60 % range\  (e) echocardiogram on 03/06/2018 showed an ejection  fraction in the 55-60% range   (4) status post bilateral nipple sparing mastectomies with right sentinel lymph  node sampling 06/27/2017, showing  (a) on the left, no evidence of malignancy  (b) on the right, residual mpT2 pN0 invasive ductal carcinoma, grade 2, with ample margins  (c) status post bilateral silicone implant placement 10/02/2017  (5) Offered adjuvant radiation by Dr. Sondra Come, however after thoughtful discussion, patient declined  (6) Tamoxifen started 07/2017   PLAN: Jeneen is now just over 2 years out from definitive surgery for her breast cancer with no evidence of disease recurrence.  This is very favorable.  She is tolerating tamoxifen well and the plan is to continue that a minimum of 5 years.  She is having regular periods and not using contraception.  She understands she may get pregnant since tamoxifen is not a contraceptive.  She is mildly anemic.  This is very stable.  I suggested she try one iron pill daily with food, which may be all she needs to bring her hemoglobin up to normal.  Of course the reason for her borderline iron deficiency is menstruation  She is taking appropriate pandemic precautions  She will see me again in 1 year.  She knows to call for any other problems that may develop before the next visit.   Adorian Gwynne, Virgie Dad, MD  07/22/19 3:22 PM Medical Oncology and Hematology [facility] [place], [place] [address] Tel. [phone]    Fax. [phone]  I, Jacqualyn Posey am acting as a Education administrator for Chauncey Cruel, MD.   I, Lurline Del MD, have reviewed the above documentation for accuracy and completeness, and I agree with the above.

## 2019-07-22 ENCOUNTER — Other Ambulatory Visit: Payer: Self-pay

## 2019-07-22 ENCOUNTER — Inpatient Hospital Stay (HOSPITAL_BASED_OUTPATIENT_CLINIC_OR_DEPARTMENT_OTHER): Payer: 59 | Admitting: Oncology

## 2019-07-22 ENCOUNTER — Inpatient Hospital Stay: Payer: 59 | Attending: Oncology

## 2019-07-22 VITALS — BP 108/73 | HR 59 | Temp 98.7°F | Resp 18 | Ht 66.0 in | Wt 140.3 lb

## 2019-07-22 DIAGNOSIS — Z1502 Genetic susceptibility to malignant neoplasm of ovary: Secondary | ICD-10-CM

## 2019-07-22 DIAGNOSIS — C50919 Malignant neoplasm of unspecified site of unspecified female breast: Secondary | ICD-10-CM | POA: Diagnosis not present

## 2019-07-22 DIAGNOSIS — Z1509 Genetic susceptibility to other malignant neoplasm: Secondary | ICD-10-CM

## 2019-07-22 DIAGNOSIS — Z79810 Long term (current) use of selective estrogen receptor modulators (SERMs): Secondary | ICD-10-CM | POA: Diagnosis not present

## 2019-07-22 DIAGNOSIS — Z170 Estrogen receptor positive status [ER+]: Secondary | ICD-10-CM | POA: Diagnosis not present

## 2019-07-22 DIAGNOSIS — Z87891 Personal history of nicotine dependence: Secondary | ICD-10-CM | POA: Diagnosis not present

## 2019-07-22 DIAGNOSIS — Z803 Family history of malignant neoplasm of breast: Secondary | ICD-10-CM | POA: Diagnosis not present

## 2019-07-22 DIAGNOSIS — C50411 Malignant neoplasm of upper-outer quadrant of right female breast: Secondary | ICD-10-CM | POA: Insufficient documentation

## 2019-07-22 DIAGNOSIS — Z9013 Acquired absence of bilateral breasts and nipples: Secondary | ICD-10-CM | POA: Diagnosis not present

## 2019-07-22 DIAGNOSIS — Z806 Family history of leukemia: Secondary | ICD-10-CM | POA: Insufficient documentation

## 2019-07-22 DIAGNOSIS — Z9221 Personal history of antineoplastic chemotherapy: Secondary | ICD-10-CM | POA: Diagnosis not present

## 2019-07-22 DIAGNOSIS — Z801 Family history of malignant neoplasm of trachea, bronchus and lung: Secondary | ICD-10-CM | POA: Diagnosis not present

## 2019-07-22 DIAGNOSIS — Z79899 Other long term (current) drug therapy: Secondary | ICD-10-CM | POA: Insufficient documentation

## 2019-07-22 DIAGNOSIS — D649 Anemia, unspecified: Secondary | ICD-10-CM | POA: Diagnosis not present

## 2019-07-22 DIAGNOSIS — Z1589 Genetic susceptibility to other disease: Secondary | ICD-10-CM

## 2019-07-22 LAB — CMP (CANCER CENTER ONLY)
ALT: 10 U/L (ref 0–44)
AST: 14 U/L — ABNORMAL LOW (ref 15–41)
Albumin: 4 g/dL (ref 3.5–5.0)
Alkaline Phosphatase: 34 U/L — ABNORMAL LOW (ref 38–126)
Anion gap: 7 (ref 5–15)
BUN: 11 mg/dL (ref 6–20)
CO2: 28 mmol/L (ref 22–32)
Calcium: 9.4 mg/dL (ref 8.9–10.3)
Chloride: 105 mmol/L (ref 98–111)
Creatinine: 0.86 mg/dL (ref 0.44–1.00)
GFR, Est AFR Am: >60 mL/min (ref >60)
GFR, Estimated: >60 mL/min (ref >60)
Glucose, Bld: 94 mg/dL (ref 70–99)
Potassium: 3.7 mmol/L (ref 3.5–5.1)
Sodium: 140 mmol/L (ref 135–145)
Total Bilirubin: 0.3 mg/dL (ref 0.3–1.2)
Total Protein: 6.9 g/dL (ref 6.5–8.1)

## 2019-07-22 LAB — CBC WITH DIFFERENTIAL (CANCER CENTER ONLY)
Abs Immature Granulocytes: 0.02 10*3/uL (ref 0.00–0.07)
Basophils Absolute: 0 10*3/uL (ref 0.0–0.1)
Basophils Relative: 0 %
Eosinophils Absolute: 0.2 10*3/uL (ref 0.0–0.5)
Eosinophils Relative: 2 %
HCT: 33.4 % — ABNORMAL LOW (ref 36.0–46.0)
Hemoglobin: 11.6 g/dL — ABNORMAL LOW (ref 12.0–15.0)
Immature Granulocytes: 0 %
Lymphocytes Relative: 30 %
Lymphs Abs: 2.7 10*3/uL (ref 0.7–4.0)
MCH: 28.2 pg (ref 26.0–34.0)
MCHC: 34.7 g/dL (ref 30.0–36.0)
MCV: 81.1 fL (ref 80.0–100.0)
Monocytes Absolute: 0.8 10*3/uL (ref 0.1–1.0)
Monocytes Relative: 9 %
Neutro Abs: 5.2 10*3/uL (ref 1.7–7.7)
Neutrophils Relative %: 59 %
Platelet Count: 252 10*3/uL (ref 150–400)
RBC: 4.12 MIL/uL (ref 3.87–5.11)
RDW: 13.3 % (ref 11.5–15.5)
WBC Count: 8.9 10*3/uL (ref 4.0–10.5)
nRBC: 0 % (ref 0.0–0.2)

## 2019-07-22 NOTE — Telephone Encounter (Signed)
Started September/2018 for five years.

## 2019-07-23 ENCOUNTER — Telehealth: Payer: Self-pay | Admitting: Oncology

## 2019-07-23 NOTE — Telephone Encounter (Signed)
I talk with patient regarding schedule  

## 2020-03-26 DIAGNOSIS — Z9013 Acquired absence of bilateral breasts and nipples: Secondary | ICD-10-CM | POA: Insufficient documentation

## 2020-03-26 NOTE — Patient Instructions (Addendum)
Blood work was ordered.    All other Health Maintenance issues reviewed.   All recommended immunizations and age-appropriate screenings are up-to-date or discussed.  No immunization administered today.  Consider the tetanus vaccine and covid vaccine.    Medications reviewed and updated.  Changes include :   none   Please followup in 1 year    Health Maintenance, Female Adopting a healthy lifestyle and getting preventive care are important in promoting health and wellness. Ask your health care provider about:  The right schedule for you to have regular tests and exams.  Things you can do on your own to prevent diseases and keep yourself healthy. What should I know about diet, weight, and exercise? Eat a healthy diet   Eat a diet that includes plenty of vegetables, fruits, low-fat dairy products, and lean protein.  Do not eat a lot of foods that are high in solid fats, added sugars, or sodium. Maintain a healthy weight Body mass index (BMI) is used to identify weight problems. It estimates body fat based on height and weight. Your health care provider can help determine your BMI and help you achieve or maintain a healthy weight. Get regular exercise Get regular exercise. This is one of the most important things you can do for your health. Most adults should:  Exercise for at least 150 minutes each week. The exercise should increase your heart rate and make you sweat (moderate-intensity exercise).  Do strengthening exercises at least twice a week. This is in addition to the moderate-intensity exercise.  Spend less time sitting. Even light physical activity can be beneficial. Watch cholesterol and blood lipids Have your blood tested for lipids and cholesterol at 47 years of age, then have this test every 5 years. Have your cholesterol levels checked more often if:  Your lipid or cholesterol levels are high.  You are older than 47 years of age.  You are at high risk for heart  disease. What should I know about cancer screening? Depending on your health history and family history, you may need to have cancer screening at various ages. This may include screening for:  Breast cancer.  Cervical cancer.  Colorectal cancer.  Skin cancer.  Lung cancer. What should I know about heart disease, diabetes, and high blood pressure? Blood pressure and heart disease  High blood pressure causes heart disease and increases the risk of stroke. This is more likely to develop in people who have high blood pressure readings, are of African descent, or are overweight.  Have your blood pressure checked: ? Every 3-5 years if you are 70-56 years of age. ? Every year if you are 65 years old or older. Diabetes Have regular diabetes screenings. This checks your fasting blood sugar level. Have the screening done:  Once every three years after age 33 if you are at a normal weight and have a low risk for diabetes.  More often and at a younger age if you are overweight or have a high risk for diabetes. What should I know about preventing infection? Hepatitis B If you have a higher risk for hepatitis B, you should be screened for this virus. Talk with your health care provider to find out if you are at risk for hepatitis B infection. Hepatitis C Testing is recommended for:  Everyone born from [birth year] through 1965.  Anyone with known risk factors for hepatitis C. Sexually transmitted infections (STIs)  Get screened for STIs, including gonorrhea and chlamydia, if: ? You are sexually  active and are younger than 47 years of age. ? You are older than 47 years of age and your health care provider tells you that you are at risk for this type of infection. ? Your sexual activity has changed since you were last screened, and you are at increased risk for chlamydia or gonorrhea. Ask your health care provider if you are at risk.  Ask your health care provider about whether you are at high  risk for HIV. Your health care provider may recommend a prescription medicine to help prevent HIV infection. If you choose to take medicine to prevent HIV, you should first get tested for HIV. You should then be tested every 3 months for as long as you are taking the medicine. Pregnancy  If you are about to stop having your period (premenopausal) and you may become pregnant, seek counseling before you get pregnant.  Take 400 to 800 micrograms (mcg) of folic acid every day if you become pregnant.  Ask for birth control (contraception) if you want to prevent pregnancy. Osteoporosis and menopause Osteoporosis is a disease in which the bones lose minerals and strength with aging. This can result in bone fractures. If you are 37 years old or older, or if you are at risk for osteoporosis and fractures, ask your health care provider if you should:  Be screened for bone loss.  Take a calcium or vitamin D supplement to lower your risk of fractures.  Be given hormone replacement therapy (HRT) to treat symptoms of menopause. Follow these instructions at home: Lifestyle  Do not use any products that contain nicotine or tobacco, such as cigarettes, e-cigarettes, and chewing tobacco. If you need help quitting, ask your health care provider.  Do not use street drugs.  Do not share needles.  Ask your health care provider for help if you need support or information about quitting drugs. Alcohol use  Do not drink alcohol if: ? Your health care provider tells you not to drink. ? You are pregnant, may be pregnant, or are planning to become pregnant.  If you drink alcohol: ? Limit how much you use to 0-1 drink a day. ? Limit intake if you are breastfeeding.  Be aware of how much alcohol is in your drink. In the [place] one drink equals one 12 oz bottle of beer (355 mL), one 5 oz glass of wine (148 mL), or one 1 oz glass of hard liquor (44 mL). General instructions  Schedule regular health, dental,  and eye exams.  Stay current with your vaccines.  Tell your health care provider if: ? You often feel depressed. ? You have ever been abused or do not feel safe at home. Summary  Adopting a healthy lifestyle and getting preventive care are important in promoting health and wellness.  Follow your health care provider's instructions about healthy diet, exercising, and getting tested or screened for diseases.  Follow your health care provider's instructions on monitoring your cholesterol and blood pressure. This information is not intended to replace advice given to you by your health care provider. Make sure you discuss any questions you have with your health care provider. Document Revised: 10/17/2018 Document Reviewed: 10/17/2018 Elsevier Patient Education  2020 [employer].

## 2020-03-26 NOTE — Progress Notes (Signed)
Subjective:    Patient ID: Olivia Frederick, female    DOB: [birth year], 49 y.o.   MRN: [id number]  HPI She is here for a physical exam.    She is still working from home and enjoying it.  She has no concerns.   She is nervous about going back into the office - will write a note advising she stay at home and work from home.   Medications and allergies reviewed with patient and updated if appropriate.  Patient Active Problem List   Diagnosis Date Noted  . H/O bilateral mastectomy 03/26/2020  . Allergic rhinitis 01/22/2018  . Family history of diabetes mellitus (DM) 01/22/2018  . CHEK2-related breast cancer ([facility]) 01/18/2018  . Breast cancer, right ([facility]) 06/27/2017  . Genetic testing 04/24/2017  . Monoallelic mutation of CHEK2 gene in female patient 04/24/2017  . Malignant neoplasm of upper-outer quadrant of right breast in female, estrogen receptor positive ([facility]) 02/02/2017    Current Outpatient Medications on File Prior to Visit  Medication Sig Dispense Refill  . tamoxifen (NOLVADEX) 20 MG tablet TAKE 1 TABLET(20 MG) BY MOUTH DAILY 90 tablet 4  . [DISCONTINUED] prochlorperazine (COMPAZINE) 10 MG tablet Take 1 tablet (10 mg total) by mouth every 6 (six) hours as needed (Nausea or vomiting). 30 tablet 1   No current facility-administered medications on file prior to visit.    Past Medical History:  Diagnosis Date  . Genetic testing 04/24/2017   [name] underwent genetic counseling and testing for hereditary cancer syndromes on 03/08/2017. 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   The remaining 45 genes analyzed by [facility]'s 46-gene Common Hereditary Cancers Panel were negative for mutations. Genes analyzed include: APC, ATM, AXIN2, BARD1, BMPR1A, BRCA1, BRCA2, BRIP1, CDH1, CDKN2  . History of breast cancer 2018   right  . History of chemotherapy    finished chemo 05/2017    Past Surgical History:  Procedure  Laterality Date  . BREAST RECONSTRUCTION WITH PLACEMENT OF TISSUE EXPANDER AND FLEX HD (ACELLULAR HYDRATED DERMIS) Bilateral 06/27/2017   Procedure: BILATERAL BREAST RECONSTRUCTION WITH PLACEMENT OF TISSUE EXPANDER AND ALLODERM;  Surgeon: Irene Limbo, MD;  Location: [facility];  Service: Plastics;  Laterality: Bilateral;  . CESAREAN SECTION     x 2  . NIPPLE SPARING MASTECTOMY/SENTINAL LYMPH NODE BIOPSY/RECONSTRUCTION/PLACEMENT OF TISSUE EXPANDER Bilateral 06/27/2017   Procedure: BILATERAL NIPPLE SPARING MASTECTOMY WITH RIGHT SENTINAL LYMPH NODE BIOPSY;  Surgeon: Excell Seltzer, MD;  Location: [facility];  Service: General;  Laterality: Bilateral;  . REMOVAL OF BILATERAL TISSUE EXPANDERS WITH PLACEMENT OF BILATERAL BREAST IMPLANTS Bilateral 10/02/2017   Procedure: REMOVAL OF BILATERAL TISSUE EXPANDERS WITH PLACEMENT OF BILATERAL BREAST IMPLANTS;  Surgeon: Irene Limbo, MD;  Location: [facility];  Service: Plastics;  Laterality: Bilateral;    Social History   Socioeconomic History  . Marital status: Married    Spouse name: Not on file  . Number of children: 2  . Years of education: Not on file  . Highest education level: Not on file  Occupational History  . Not on file  Tobacco Use  . Smoking status: Former Smoker    Quit date: 02/01/2017    Years since quitting: 3.1  . Smokeless tobacco: Never Used  . Tobacco comment: social smoking for 20 years  Substance and Sexual Activity  . Alcohol use: Yes    Comment: 3-4 drinks/week  . Drug use: No  . Sexual activity: Not on  file  Other Topics Concern  . Not on file  Social History Narrative   [place]   2 kids 80 (Zach), 10 ( gabby)      Irregular exercise -- walking/running with daughter   Social Determinants of Health   Financial Resource Strain:   . Difficulty of Paying Living Expenses:   Food Insecurity:   . Worried About Charity fundraiser in the Last  Year:   . Arboriculturist in the Last Year:   Transportation Needs:   . Film/video editor (Medical):   Marland Kitchen Lack of Transportation (Non-Medical):   Physical Activity:   . Days of Exercise per Week:   . Minutes of Exercise per Session:   Stress:   . Feeling of Stress :   Social Connections:   . Frequency of Communication with Friends and Family:   . Frequency of Social Gatherings with Friends and Family:   . Attends Religious Services:   . Active Member of Clubs or Organizations:   . Attends Archivist Meetings:   Marland Kitchen Marital Status:     Family History  Problem Relation Age of Onset  . Hypertension Mother   . Breast cancer Mother        diagnosed after her  . Thyroid disease Mother   . Other Mother        carotid artery dis  . Diabetes Father   . Leukemia Paternal Grandfather        d.64  . Breast cancer Cousin 40       Bilateral breast cancer. Daughter of paternal uncle with leukemia.  . Leukemia Paternal Uncle 68  . Lung cancer Maternal Uncle 74  . Diabetes Brother   . Brain cancer Maternal Aunt 65       d.66  . Cancer Maternal Uncle        unspecified type  . Brain cancer Cousin 8       d.55 daughter of maternal aunt with brain cancer    Review of Systems  Constitutional: Negative for chills and fever.  Eyes: Negative for visual disturbance.  Respiratory: Negative for cough, shortness of breath and wheezing.   Cardiovascular: Positive for palpitations (occ - intense exercise). Negative for chest pain and leg swelling.  Gastrointestinal: Negative for abdominal pain, blood in stool, constipation, diarrhea and nausea.       Very occ gerd  Genitourinary: Negative for dysuria and hematuria.  Musculoskeletal: Positive for back pain (intermittent). Negative for arthralgias.  Skin: Negative for color change and rash.  Neurological: Positive for light-headedness (occ - when she needs to drink, eat). Negative for dizziness and headaches.    Psychiatric/Behavioral: Negative for dysphoric mood. The patient is not nervous/anxious.        Objective:   Vitals:   03/27/20 1329  BP: 122/78  Pulse: 70  Resp: 16  Temp: 98.5 F (36.9 C)  SpO2: 99%   Filed Weights   03/27/20 1329  Weight: 137 lb 9.6 oz (62.4 kg)   Body mass index is 22.21 kg/m.  BP Readings from Last 3 Encounters:  03/27/20 122/78  07/22/19 108/73  03/26/19 126/78    Wt Readings from Last 3 Encounters:  03/27/20 137 lb 9.6 oz (62.4 kg)  07/22/19 140 lb 4.8 oz (63.6 kg)  03/26/19 133 lb (60.3 kg)     Physical Exam Constitutional: She appears well-developed and well-nourished. No distress.  HENT:  Head: Normocephalic and atraumatic.  Right Ear: External ear normal.  Normal ear canal and TM Left Ear: External ear normal.  Normal ear canal and TM Mouth/Throat: Oropharynx is clear and moist.  Eyes: Conjunctivae and EOM are normal.  Neck: Neck supple. No tracheal deviation present. No thyromegaly present.  No carotid bruit  Cardiovascular: Normal rate, regular rhythm and normal heart sounds.   No murmur heard.  No edema. Pulmonary/Chest: Effort normal and breath sounds normal. No respiratory distress. She has no wheezes. She has no rales.  Breast: deferred   Abdominal: Soft. She exhibits no distension. There is no tenderness.  Lymphadenopathy: She has no cervical adenopathy.  Skin: Skin is warm and dry. She is not diaphoretic.  Psychiatric: She has a normal mood and affect. Her behavior is normal.        Assessment & Plan:   Physical exam: Screening blood work    ordered Immunizations  Discussed tdap, covid - advised both Mammogram  N/a  S/p b/l mastectomy Colonoscopy - up to date - 2018 Gyn  Up to date Eye exams  Up to date  Exercise  walking Weight  Normal BMI Substance abuse  none  See Problem List for Assessment and Plan of chronic medical problems.     This visit occurred during the SARS-CoV-2 public health emergency.   Safety protocols were in place, including screening questions prior to the visit, additional usage of staff PPE, and extensive cleaning of exam room while observing appropriate contact time as indicated for disinfecting solutions.

## 2020-03-27 ENCOUNTER — Encounter: Payer: Self-pay | Admitting: Internal Medicine

## 2020-03-27 ENCOUNTER — Ambulatory Visit (INDEPENDENT_AMBULATORY_CARE_PROVIDER_SITE_OTHER): Payer: 59 | Admitting: Internal Medicine

## 2020-03-27 ENCOUNTER — Other Ambulatory Visit: Payer: Self-pay

## 2020-03-27 VITALS — BP 122/78 | HR 70 | Temp 98.5°F | Resp 16 | Ht 66.0 in | Wt 137.6 lb

## 2020-03-27 DIAGNOSIS — Z833 Family history of diabetes mellitus: Secondary | ICD-10-CM | POA: Diagnosis not present

## 2020-03-27 DIAGNOSIS — Z0000 Encounter for general adult medical examination without abnormal findings: Secondary | ICD-10-CM

## 2020-03-27 DIAGNOSIS — C50411 Malignant neoplasm of upper-outer quadrant of right female breast: Secondary | ICD-10-CM | POA: Diagnosis not present

## 2020-03-27 LAB — HEMOGLOBIN A1C: Hgb A1c MFr Bld: 5.1 % (ref 4.6–6.5)

## 2020-03-27 LAB — COMPREHENSIVE METABOLIC PANEL
ALT: 11 U/L (ref 0–35)
AST: 16 U/L (ref 0–37)
Albumin: 4.7 g/dL (ref 3.5–5.2)
Alkaline Phosphatase: 35 U/L — ABNORMAL LOW (ref 39–117)
BUN: 10 mg/dL (ref 6–23)
CO2: 25 mEq/L (ref 19–32)
Calcium: 9.7 mg/dL (ref 8.4–10.5)
Chloride: 102 mEq/L (ref 96–112)
Creatinine, Ser: 0.77 mg/dL (ref 0.40–1.20)
GFR: 97.43 mL/min (ref >60.00)
Glucose, Bld: 91 mg/dL (ref 70–99)
Potassium: 3.8 mEq/L (ref 3.5–5.1)
Sodium: 135 mEq/L (ref 135–145)
Total Bilirubin: 0.8 mg/dL (ref 0.2–1.2)
Total Protein: 7.7 g/dL (ref 6.0–8.3)

## 2020-03-27 LAB — CBC WITH DIFFERENTIAL/PLATELET
Basophils Absolute: 0.1 10*3/uL (ref 0.0–0.1)
Basophils Relative: 0.7 % (ref 0.0–3.0)
Eosinophils Absolute: 0.2 10*3/uL (ref 0.0–0.7)
Eosinophils Relative: 2 % (ref 0.0–5.0)
HCT: 38.4 % (ref 36.0–46.0)
Hemoglobin: 13.5 g/dL (ref 12.0–15.0)
Lymphocytes Relative: 23.3 % (ref 12.0–46.0)
Lymphs Abs: 2.6 10*3/uL (ref 0.7–4.0)
MCHC: 35.1 g/dL (ref 30.0–36.0)
MCV: 81.4 fl (ref 78.0–100.0)
Monocytes Absolute: 1.1 10*3/uL — ABNORMAL HIGH (ref 0.1–1.0)
Monocytes Relative: 9.9 % (ref 3.0–12.0)
Neutro Abs: 7.2 10*3/uL (ref 1.4–7.7)
Neutrophils Relative %: 64.1 % (ref 43.0–77.0)
Platelets: 240 10*3/uL (ref 150.0–400.0)
RBC: 4.72 Mil/uL (ref 3.87–5.11)
RDW: 13.8 % (ref 11.5–15.5)
WBC: 11.2 10*3/uL — ABNORMAL HIGH (ref 4.0–10.5)

## 2020-03-27 LAB — LIPID PANEL
Cholesterol: 187 mg/dL (ref 0–200)
HDL: 70 mg/dL (ref >39.00)
LDL Cholesterol: 106 mg/dL — ABNORMAL HIGH (ref 0–99)
NonHDL: 117.04
Total CHOL/HDL Ratio: 3
Triglycerides: 57 mg/dL (ref 0.0–149.0)
VLDL: 11.4 mg/dL (ref 0.0–40.0)

## 2020-03-27 LAB — TSH: TSH: 0.74 u[IU]/mL (ref 0.35–4.50)

## 2020-03-27 NOTE — Assessment & Plan Note (Signed)
Chronic Check a1c Low sugar / carb diet Stressed regular exercise  

## 2020-03-27 NOTE — Assessment & Plan Note (Signed)
Following with Dr Jana Hakim On tamoxifen - tolerating it well

## 2020-03-30 ENCOUNTER — Telehealth: Payer: Self-pay

## 2020-07-22 ENCOUNTER — Other Ambulatory Visit: Payer: Self-pay

## 2020-07-22 ENCOUNTER — Inpatient Hospital Stay: Payer: 59 | Attending: Oncology | Admitting: Oncology

## 2020-07-22 ENCOUNTER — Encounter: Payer: Self-pay | Admitting: Oncology

## 2020-07-22 ENCOUNTER — Inpatient Hospital Stay: Payer: 59

## 2020-07-22 VITALS — BP 119/74 | HR 80 | Temp 97.3°F | Resp 18 | Ht 66.0 in | Wt 140.5 lb

## 2020-07-22 DIAGNOSIS — Z87891 Personal history of nicotine dependence: Secondary | ICD-10-CM | POA: Diagnosis not present

## 2020-07-22 DIAGNOSIS — C50919 Malignant neoplasm of unspecified site of unspecified female breast: Secondary | ICD-10-CM

## 2020-07-22 DIAGNOSIS — Z803 Family history of malignant neoplasm of breast: Secondary | ICD-10-CM | POA: Insufficient documentation

## 2020-07-22 DIAGNOSIS — Z9221 Personal history of antineoplastic chemotherapy: Secondary | ICD-10-CM | POA: Diagnosis not present

## 2020-07-22 DIAGNOSIS — Z806 Family history of leukemia: Secondary | ICD-10-CM | POA: Diagnosis not present

## 2020-07-22 DIAGNOSIS — Z79810 Long term (current) use of selective estrogen receptor modulators (SERMs): Secondary | ICD-10-CM | POA: Insufficient documentation

## 2020-07-22 DIAGNOSIS — C50411 Malignant neoplasm of upper-outer quadrant of right female breast: Secondary | ICD-10-CM

## 2020-07-22 DIAGNOSIS — C50911 Malignant neoplasm of unspecified site of right female breast: Secondary | ICD-10-CM | POA: Diagnosis not present

## 2020-07-22 DIAGNOSIS — Z9013 Acquired absence of bilateral breasts and nipples: Secondary | ICD-10-CM | POA: Insufficient documentation

## 2020-07-22 DIAGNOSIS — Z1502 Genetic susceptibility to malignant neoplasm of ovary: Secondary | ICD-10-CM

## 2020-07-22 DIAGNOSIS — Z170 Estrogen receptor positive status [ER+]: Secondary | ICD-10-CM

## 2020-07-22 DIAGNOSIS — Z79899 Other long term (current) drug therapy: Secondary | ICD-10-CM | POA: Insufficient documentation

## 2020-07-22 DIAGNOSIS — Z1589 Genetic susceptibility to other disease: Secondary | ICD-10-CM

## 2020-07-22 DIAGNOSIS — Z1509 Genetic susceptibility to other malignant neoplasm: Secondary | ICD-10-CM

## 2020-07-22 LAB — CBC WITH DIFFERENTIAL/PLATELET
Abs Immature Granulocytes: 0.01 10*3/uL (ref 0.00–0.07)
Basophils Absolute: 0 10*3/uL (ref 0.0–0.1)
Basophils Relative: 1 %
Eosinophils Absolute: 0.1 10*3/uL (ref 0.0–0.5)
Eosinophils Relative: 2 %
HCT: 34.2 % — ABNORMAL LOW (ref 36.0–46.0)
Hemoglobin: 12.2 g/dL (ref 12.0–15.0)
Immature Granulocytes: 0 %
Lymphocytes Relative: 27 %
Lymphs Abs: 1.6 10*3/uL (ref 0.7–4.0)
MCH: 28.4 pg (ref 26.0–34.0)
MCHC: 35.7 g/dL (ref 30.0–36.0)
MCV: 79.5 fL — ABNORMAL LOW (ref 80.0–100.0)
Monocytes Absolute: 0.6 10*3/uL (ref 0.1–1.0)
Monocytes Relative: 10 %
Neutro Abs: 3.5 10*3/uL (ref 1.7–7.7)
Neutrophils Relative %: 60 %
Platelets: 243 10*3/uL (ref 150–400)
RBC: 4.3 MIL/uL (ref 3.87–5.11)
RDW: 12.7 % (ref 11.5–15.5)
WBC: 5.8 10*3/uL (ref 4.0–10.5)
nRBC: 0 % (ref 0.0–0.2)

## 2020-07-22 LAB — COMPREHENSIVE METABOLIC PANEL
ALT: 14 U/L (ref 0–44)
AST: 19 U/L (ref 15–41)
Albumin: 3.8 g/dL (ref 3.5–5.0)
Alkaline Phosphatase: 37 U/L — ABNORMAL LOW (ref 38–126)
Anion gap: 6 (ref 5–15)
BUN: 7 mg/dL (ref 6–20)
CO2: 31 mmol/L (ref 22–32)
Calcium: 9.6 mg/dL (ref 8.9–10.3)
Chloride: 105 mmol/L (ref 98–111)
Creatinine, Ser: 0.95 mg/dL (ref 0.44–1.00)
GFR calc Af Amer: >60 mL/min (ref >60)
GFR calc non Af Amer: >60 mL/min (ref >60)
Glucose, Bld: 93 mg/dL (ref 70–99)
Potassium: 4 mmol/L (ref 3.5–5.1)
Sodium: 142 mmol/L (ref 135–145)
Total Bilirubin: 0.3 mg/dL (ref 0.3–1.2)
Total Protein: 7.3 g/dL (ref 6.5–8.1)

## 2020-07-22 MED ORDER — TAMOXIFEN CITRATE 20 MG PO TABS: ORAL_TABLET | ORAL | 4 refills | Status: DC

## 2020-07-22 NOTE — Progress Notes (Signed)
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  She continues on tamoxifen.  She tolerates this well, with no significant hot flashes or other problems.  She continues to have regular periods.  REVIEW OF SYSTEMS: Devi feels "great".  She is participating in a play in [place] and enjoying it greatly.  She exercises by walking 3-5 times a week.  She received the [place] vaccine without complications.   HISTORY OF CURRENT ILLNESS: From the original intake note:  Olivia Frederick noted a change in her right breast around November 2017. She was not very concerned about it but did mention it at the time of mammography which had been scheduled for 01/30/2017. Accordingly she had bilateral diagnostic mammography with tomography and right breast ultrasonography that day at the [facility]. In the right breast there was an irregular mass measuring approximately 5 cm. On exam this was firm and palpable at the 12:00 location 2 cm from the nipple targeted ultrasonography showed an area of mixed echogenicity superiorly measuring at least 4.6 cm with a similar appearing adjacent area measuring 1.3 cm thought to be contiguous ultrasound of the right axilla showed one ymph node  with cortical thickening.  Biopsy of the right breast mass 01/31/2017 showed ([id number]) invasive ductal carcinoma, grade 2, estrogen receptor 100% positive, with strong staining intensity, progesterone receptor 25% positive, with moderate staining intensity, with an MIB-1 of 25%, and HER-2 amplified with a signals ratio of 2.35, the number per cell being 4.0. Biopsy of the suspicious axillary lymph node showed only reactive  Her subsequent history is as detailed below   PAST MEDICAL HISTORY: Past Medical History:  Diagnosis Date  . Genetic testing 04/24/2017   [name] underwent genetic counseling and testing for hereditary cancer syndromes on 03/08/2017. 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   The remaining 45 genes analyzed by [facility]'s 46-gene Common Hereditary Cancers Panel were negative for mutations. Genes analyzed include: APC, ATM, AXIN2, BARD1, BMPR1A, BRCA1, BRCA2, BRIP1, CDH1, CDKN2  . History of breast cancer 2018   right  . History of chemotherapy    finished chemo 05/2017    PAST SURGICAL HISTORY: Past Surgical History:  Procedure Laterality Date  . BREAST RECONSTRUCTION WITH PLACEMENT OF TISSUE EXPANDER AND FLEX HD (ACELLULAR HYDRATED DERMIS) Bilateral 06/27/2017   Procedure: BILATERAL BREAST RECONSTRUCTION WITH PLACEMENT OF TISSUE EXPANDER AND ALLODERM;  Surgeon: Irene Limbo, MD;  Location: [facility];  Service: Plastics;  Laterality: Bilateral;  . CESAREAN SECTION     x 2  . NIPPLE SPARING MASTECTOMY/SENTINAL LYMPH NODE BIOPSY/RECONSTRUCTION/PLACEMENT OF TISSUE EXPANDER Bilateral 06/27/2017   Procedure: BILATERAL NIPPLE SPARING MASTECTOMY WITH RIGHT SENTINAL LYMPH NODE BIOPSY;  Surgeon: Excell Seltzer, MD;  Location: [facility];  Service: General;  Laterality: Bilateral;  . REMOVAL OF BILATERAL TISSUE EXPANDERS WITH PLACEMENT OF BILATERAL BREAST IMPLANTS  Bilateral  10/02/2017   Procedure: REMOVAL OF BILATERAL TISSUE EXPANDERS WITH PLACEMENT OF BILATERAL BREAST IMPLANTS;  Surgeon: Irene Limbo, MD;  Location: [facility];  Service: Plastics;  Laterality: Bilateral;    FAMILY HISTORY: Family History  Problem Relation Age of Onset  . Hypertension Mother   . Breast cancer Mother        diagnosed after her  . Thyroid disease Mother   . Other Mother        carotid artery dis  . Diabetes Father   . Leukemia Paternal Grandfather        d.64  . Breast cancer Cousin 40       Bilateral breast cancer. Daughter of paternal uncle with leukemia.  . Leukemia Paternal Uncle 66  . Lung cancer Maternal Uncle 74  . Diabetes Brother   . Brain cancer Maternal Aunt 65       d.66  . Cancer Maternal Uncle        unspecified type  . Brain cancer Cousin 40       d.55 daughter of maternal aunt with brain cancer   The patient's parents are living, both in their early 52s as of April 2018. She has one brother and one sister. On the paternal side a grandfather was diagnosed with leukemia at age 63, an uncle also with leukemia at age 6, another uncle at age 4 with colon cancer, and a cousin with breast cancer at age 79. On the mother's side there was an uncle with cancer of some type, not known to the patient, diagnosed age 35    GYNECOLOGIC HISTORY:  No LMP recorded. (Menstrual status: Chemotherapy). Menarche: 49 years old Age at first live birth: 49 years old [place] P 2 LMP Her periods were briefly interrupted with chemotherapy but resumed on October 2019. Contraceptive never used HRT n/a  Hysterectomy? no BSO? 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  Her husband runs a Quarry manager . Son [name] is 20, daughter Janace Hoard is 61, as of April 2018.    ADVANCED DIRECTIVES: In the absence of any documentation to the contrary, the patient's spouse is their HCPOA.    HEALTH MAINTENANCE: Social History   Tobacco Use  .  Smoking status: Former Smoker    Quit date: 02/01/2017    Years since quitting: 3.4  . Smokeless tobacco: Never Used  . Tobacco comment: social smoking for 20 years  Vaping Use  . Vaping Use: Never used  Substance Use Topics  . Alcohol use: Yes    Comment: 3-4 drinks/week  . Drug use: No     Colonoscopy: n/a (age)  PAP: not on file  Bone density: n/a (age)   No Known Allergies  Current Outpatient Medications  Medication Sig Dispense Refill  . tamoxifen (NOLVADEX) 20 MG tablet TAKE 1 TABLET(20 MG) BY MOUTH DAILY 90 tablet 4   No current facility-administered medications for this visit.    OBJECTIVE: African-American woman who appears well  Vitals:   07/22/20 1520  BP: 119/74  Pulse: 80  Resp: 18  Temp: (!) 97.3 F (36.3 C)  SpO2: 100%     Body mass index is 22.68 kg/m.   Wt Readings from Last 3 Encounters:  07/22/20 140 lb 8 oz (63.7 kg)  03/27/20 137 lb 9.6 oz (62.4 kg)  07/22/19 140 lb 4.8 oz (63.6 kg)      ECOG FS:1 - Symptomatic but completely ambulatory  Ocular: Sclerae unicteric,  pupils round and equal Ear-nose-throat: Wearing a mask Lymphatic: No cervical or supraclavicular adenopathy Lungs no rales or rhonchi Heart regular rate and rhythm Abd soft, nontender, positive bowel sounds MSK no focal spinal tenderness, no joint edema Neuro: non-focal, well-oriented, appropriate affect Breasts: Status post bilateral nipple sparing mastectomies with silicone implant reconstruction.  There is no evidence of disease recurrence.  Both axillae are benign.   LAB RESULTS:  CMP     Component Value Date/Time   NA 135 03/27/2020 1412   NA 141 10/26/2017 1442   K 3.8 03/27/2020 1412   K 4.3 10/26/2017 1442   CL 102 03/27/2020 1412   CO2 25 03/27/2020 1412   CO2 26 10/26/2017 1442   GLUCOSE 91 03/27/2020 1412   GLUCOSE 91 10/26/2017 1442   BUN 10 03/27/2020 1412   BUN 8.1 10/26/2017 1442   CREATININE 0.77 03/27/2020 1412   CREATININE 0.86 07/22/2019 1504    CREATININE 0.8 10/26/2017 1442   CALCIUM 9.7 03/27/2020 1412   CALCIUM 9.4 10/26/2017 1442   PROT 7.7 03/27/2020 1412   PROT 7.3 10/26/2017 1442   ALBUMIN 4.7 03/27/2020 1412   ALBUMIN 4.0 10/26/2017 1442   AST 16 03/27/2020 1412   AST 14 (L) 07/22/2019 1504   AST 13 10/26/2017 1442   ALT 11 03/27/2020 1412   ALT 10 07/22/2019 1504   ALT 8 10/26/2017 1442   ALKPHOS 35 (L) 03/27/2020 1412   ALKPHOS 49 10/26/2017 1442   BILITOT 0.8 03/27/2020 1412   BILITOT 0.3 07/22/2019 1504   BILITOT 0.31 10/26/2017 1442   GFRNONAA >60 07/22/2019 1504   GFRAA >60 07/22/2019 1504    No results found for: TOTALPROTELP, ALBUMINELP, A1GS, A2GS, BETS, BETA2SER, GAMS, MSPIKE, SPEI  Lab Results  Component Value Date   WBC 5.8 07/22/2020   NEUTROABS 3.5 07/22/2020   HGB 12.2 07/22/2020   HCT 34.2 (L) 07/22/2020   MCV 79.5 (L) 07/22/2020   PLT 243 07/22/2020    No results found for: LABCA2  No components found for: [id number]  No results for input(s): INR in the last 168 hours.  No results found for: LABCA2  No results found for: KKX381  No results found for: WEX937  No results found for: JIR678  No results found for: CA2729  No components found for: HGQUANT  No results found for: CEA1 / No results found for: CEA1   No results found for: AFPTUMOR  No results found for: CHROMOGRNA  No results found for: KPAFRELGTCHN, LAMBDASER, KAPLAMBRATIO (kappa/lambda light chains)  No results found for: HGBA, HGBA2QUANT, HGBFQUANT, HGBSQUAN (Hemoglobinopathy evaluation)   No results found for: LDH  No results found for: IRON, TIBC, IRONPCTSAT (Iron and TIBC)  No results found for: FERRITIN  Urinalysis    Component Value Date/Time   COLORURINE yellow 01/22/2010 1457   APPEARANCEUR Clear 01/22/2010 1457   LABSPEC 1.015 01/22/2010 1457   PHURINE 5.0 01/22/2010 1457   GLUCOSEU NEGATIVE 05/24/2007 1404   HGBUR trace-lysed 01/22/2010 1457   BILIRUBINUR negative 01/22/2010 1457    KETONESUR NEGATIVE 05/24/2007 1404   PROTEINUR NEGATIVE 05/24/2007 1404   UROBILINOGEN 0.2 01/22/2010 1457   NITRITE negative 01/22/2010 1457   LEUKOCYTESUR  05/24/2007 1404    NEGATIVE MICROSCOPIC NOT DONE ON URINES WITH NEGATIVE PROTEIN, BLOOD, LEUKOCYTES, NITRITE, OR GLUCOSE <1000 mg/dL.     STUDIES: No results found.   ELIGIBLE FOR AVAILABLE RESEARCH PROTOCOL: no  ASSESSMENT: 49 y.o. CHEK2 positive [place] woman status post right breast biopsy 02/01/2017 for a  cT2 pN0, stage IB invasive ductal carcinoma, grade 2, estrogen and progesterone receptor positive, with an MIB-1 of 25%, and HER-2 amplified              (a) biopsy of a suspicious right axillary lymph node 01/24/2017 showed only reactive lymphoid tissue             (b) biopsy of right breast lower outer quadrant 02/17/2017 showed invasive ductal carcinoma, grade 3, estrogen and progesterone receptor positive, HER-2 not amplified, with an Mib-1 of 50%  (1) genetics testing 46-gene Common Hereditary Cancer Panel offered by [facility] identified a single, heterozygous pathogenic gene mutation called CHEK2, c.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 There were no additionalmutations inthe remaining 45 genes analyzed: APC, ATM, AXIN2, BARD1, BMPR1A, BRCA1, BRCA2, BRIP1, CDH1, CDKN2A, CTNNA1, DICER1, EPCAM, GREM1, HOXB13, KIT, MEN1, MLH1, MSH2, MSH3, MSH6, MUTYH, NBN, NF1, NTHL1, PALB2, PDGFRA, PMS2, POLD1, POLE, PTEN, RAD50, RAD51C, RAD51D, SDHA, SDHB, SDHC, SDHD, SMAD4, SMARCA4, STK11, TP53, TSC1, TSC2, and VHL.             (a) will need either bilateral mastectomies or intensified screening             (b) colon cancer screening started 2018 (colonoscopy) under Dr. Collene Mares  (2) neoadjuvant chemotherapy with carboplatin, docetaxel, and trastuzumab with pertuzumab given every 3 weeks 6, starting 02/16/2017, completed 06/01/2017  (3) anti-HER-2 immunotherapy completed to total 1 year (through 02/08/2018)             (a) baseline echocardiogram  02/13/2017 found an ejection fraction of 55-60%             (b) echocardiogram 05/23/2017 found an ejection fraction of 55-60%.             (c) echo on 08/18/2017 demonstrates LVEF of 60-65%             (d) echo on 11/27/2017 showed an ejection fraction in the 55-60 % range\             (e) echocardiogram on 03/06/2018 showed an ejection fraction in the 55-60% range   (4) status post bilateral nipple sparing mastectomies with right sentinel lymph node sampling 06/27/2017, showing             (a) on the left, no evidence of malignancy             (b) on the right, residual ympT2 ypN0 invasive ductal carcinoma, grade 2, with ample margins             (c) status post bilateral silicone implant placement 10/02/2017  (5) Offered adjuvant radiation by Dr. Sondra Come, however after thoughtful discussion, patient declined  (6) Tamoxifen started 07/2017  (7) colon cancer screening  (a) colonoscopy 2018  PLAN: Olivia Frederick is now 3 years out from definitive surgery for breast cancer with no evidence of disease recurrence.  This is a very favorable.  She is tolerating tamoxifen well and the plan is to continue that a minimum of 5 years, more likely 10.  The CHEK2 mutation she carries also increases the risk of colon cancer.  The general recommendation is for colonoscopy every 5 years but it could be more frequent depending on whether polyps are found or not on how many if so.  I am sending Dr. Collene Mares a letter regarding this since I cannot actually locate records of the 2018 colonoscopy.  Also occasional very likely carries an alpha thalassemia trait.  I am going to check iron studies at the next visit to confirm  Total encounter time 25 minutes.Sarajane Jews C. Orene Abbasi, MD 07/22/2020 3:45 PM Medical Oncology and Hematology [facility] [place], [place] [address] Tel. [phone]    Fax. [phone]   This document serves as a record of services personally performed by Lurline Del, MD. It was created on his behalf by Wilburn Mylar, a trained medical scribe. The creation of this record is based on the scribe's personal observations and the provider's statements to them.   I, Lurline Del MD, have reviewed the above documentation for accuracy and completeness, and I agree with the above.    *Total Encounter Time as defined by the Centers for Medicare and Medicaid Services includes, in addition to the face-to-face time of a patient visit (documented in the note above) non-face-to-face time: obtaining and reviewing outside history, ordering and reviewing medications, tests or procedures, care coordination (communications with other health care professionals or caregivers) and documentation in the medical record.

## 2020-07-23 LAB — FERRITIN: Ferritin: 55 ng/mL (ref 11–307)

## 2020-08-13 ENCOUNTER — Other Ambulatory Visit: Payer: Self-pay | Admitting: Oncology

## 2020-08-13 DIAGNOSIS — C50911 Malignant neoplasm of unspecified site of right female breast: Secondary | ICD-10-CM

## 2020-08-13 DIAGNOSIS — C50411 Malignant neoplasm of upper-outer quadrant of right female breast: Secondary | ICD-10-CM

## 2020-08-13 DIAGNOSIS — C50919 Malignant neoplasm of unspecified site of unspecified female breast: Secondary | ICD-10-CM

## 2020-08-13 DIAGNOSIS — Z1509 Genetic susceptibility to other malignant neoplasm: Secondary | ICD-10-CM

## 2020-08-13 DIAGNOSIS — Z170 Estrogen receptor positive status [ER+]: Secondary | ICD-10-CM

## 2020-09-02 IMAGING — MG MM CLIP PLACEMENT
3 series · 3 of 3 positions shown · non-contrast
Comparison: Previous exam(s).

CLINICAL DATA: Status post ultrasound-guided biopsy of a LEFT
breast mass at the 11:30 o'clock axis.

EXAM:
DIAGNOSTIC LEFT MAMMOGRAM POST ULTRASOUND BIOPSY

[L ML]
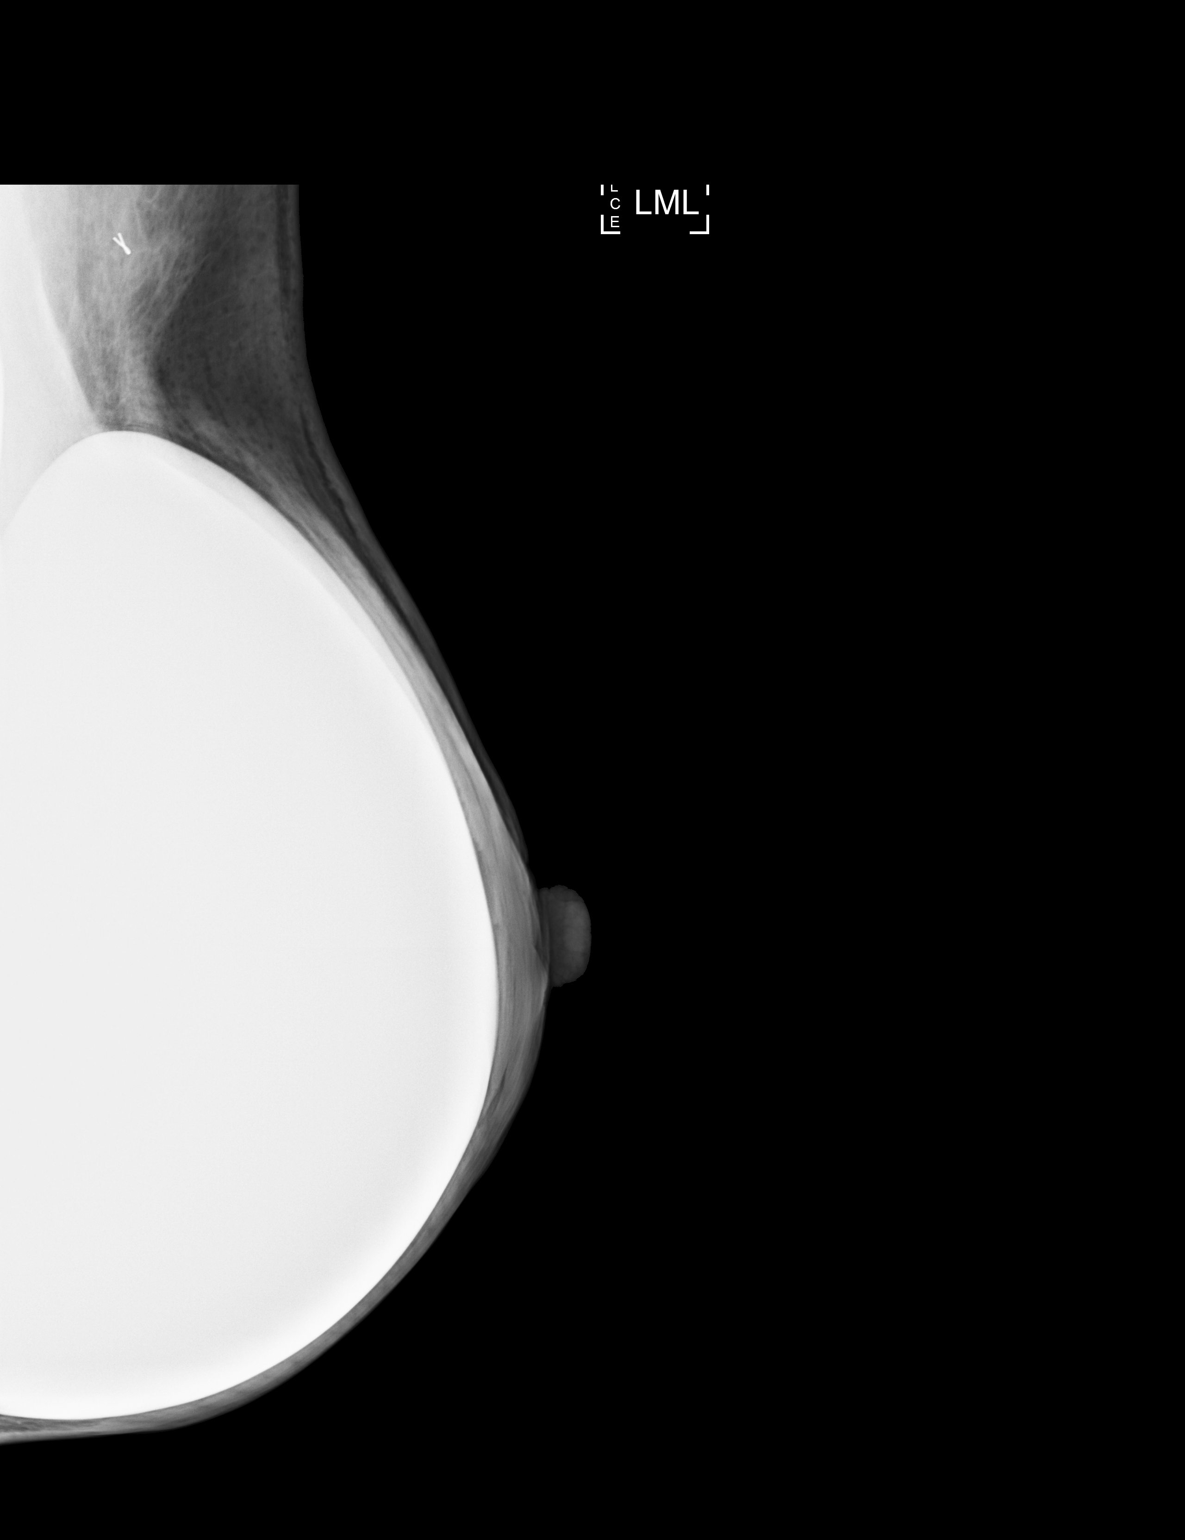

[L XCCL (1 of 2)]
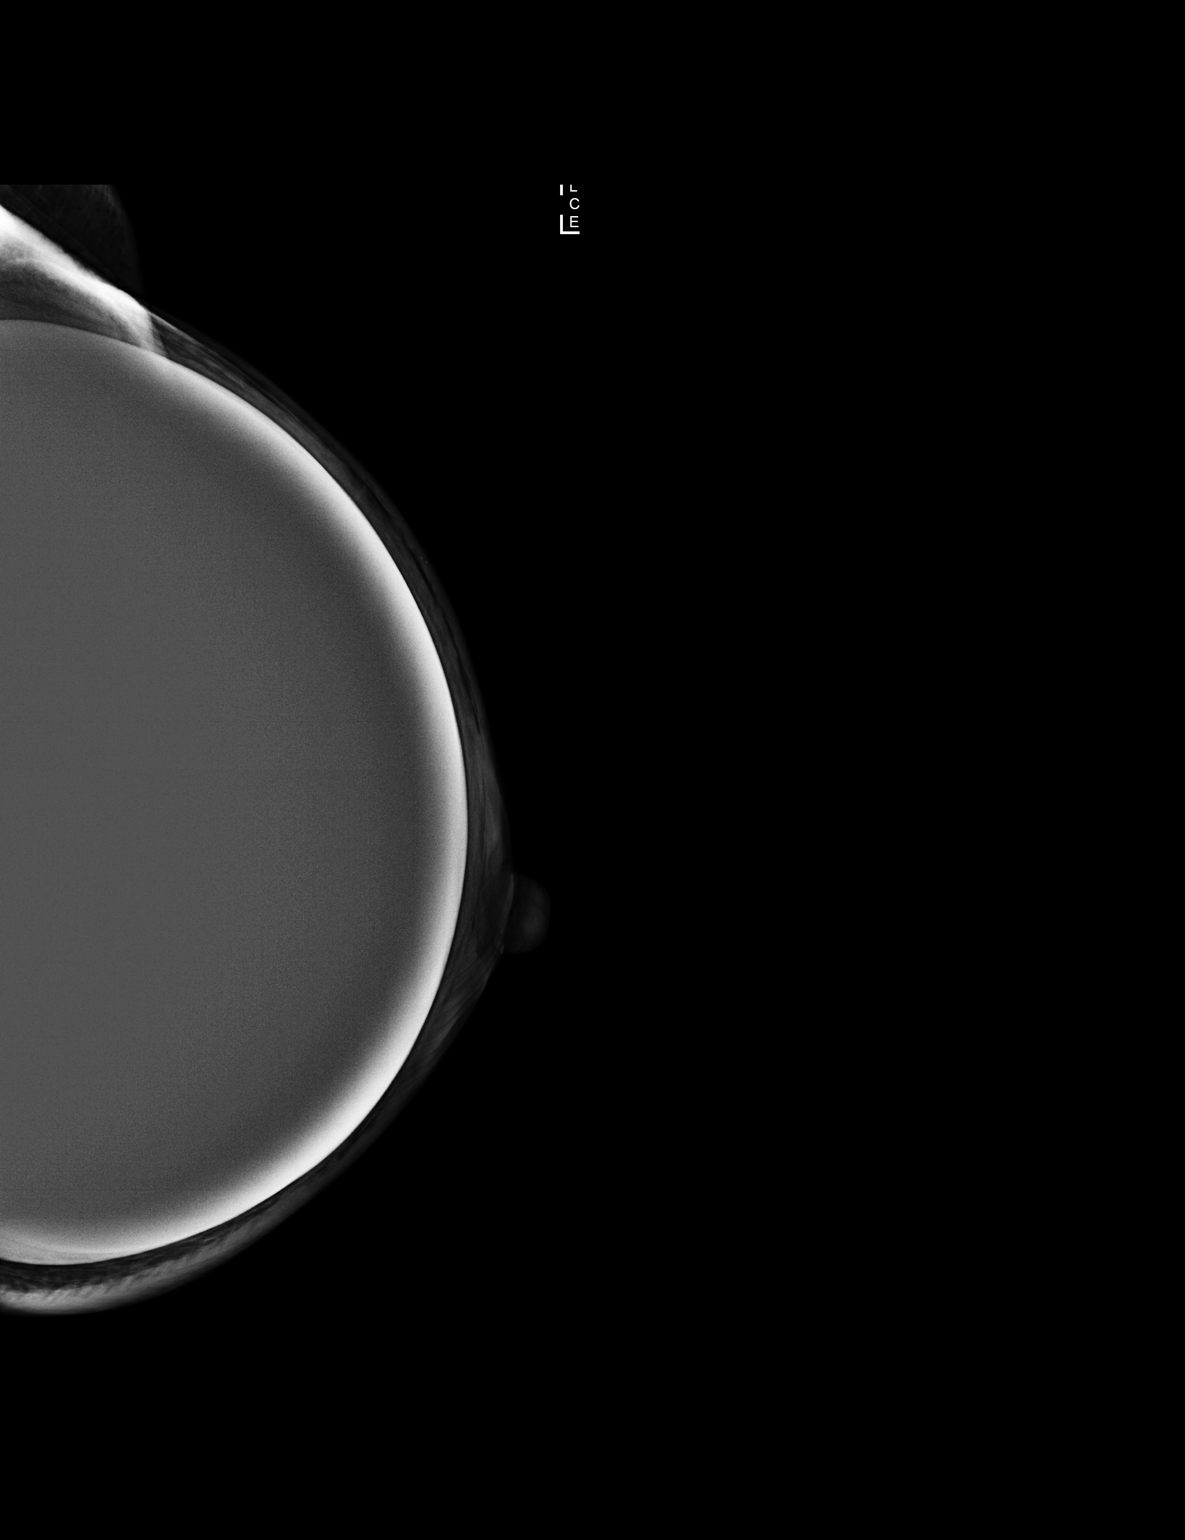

[L XCCL (2 of 2)]
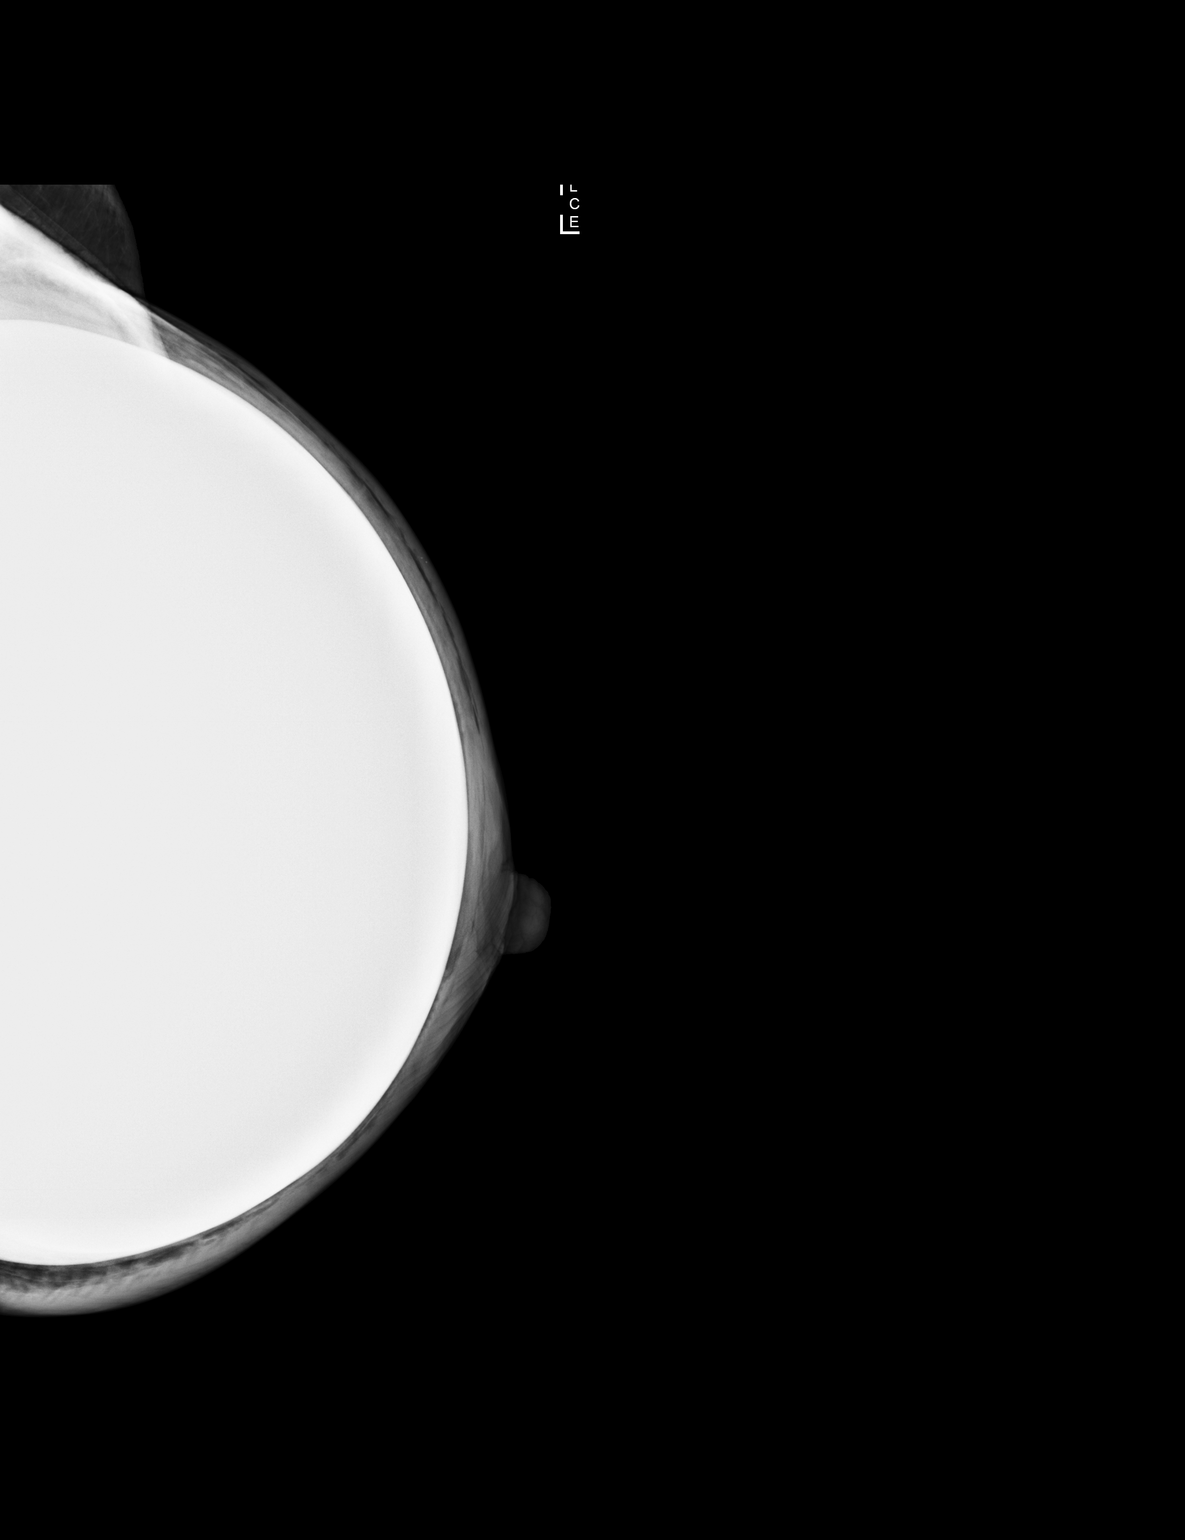

[3 of 3 positions shown; findings below may reference images not displayed]

FINDINGS: Mammographic images were obtained following ultrasound guided biopsy
of the LEFT breast mass at the 11:30 o'clock axis. Ribbon shaped
clip is appropriately positioned.
IMPRESSION: Ribbon shaped clip is appropriately positioned within the upper LEFT
breast corresponding to the site of the targeted mass at the 11:30
o'clock axis.

Final Assessment: Post Procedure Mammograms for Marker Placement

## 2020-09-17 ENCOUNTER — Encounter: Payer: Self-pay | Admitting: Internal Medicine

## 2020-09-17 NOTE — Progress Notes (Unsigned)
Outside notes received. Information abstracted. Notes sent to scan.  

## 2020-10-19 LAB — HM COLONOSCOPY

## 2020-12-07 ENCOUNTER — Encounter: Payer: Self-pay | Admitting: Internal Medicine

## 2020-12-07 NOTE — Progress Notes (Signed)
Outside notes received. Information abstracted. Notes sent to scan.  

## 2021-03-28 NOTE — Patient Instructions (Addendum)
Blood work was ordered.     Medications changes include :   Diflucan 150 mg x 1  Your prescription(s) have been submitted to your pharmacy. Please take as directed and contact our office if you believe you are having problem(s) with the medication(s).   Please followup in 1 year     Health Maintenance, Female Adopting a healthy lifestyle and getting preventive care are important in promoting health and wellness. Ask your health care provider about:  The right schedule for you to have regular tests and exams.  Things you can do on your own to prevent diseases and keep yourself healthy. What should I know about diet, weight, and exercise? Eat a healthy diet  Eat a diet that includes plenty of vegetables, fruits, low-fat dairy products, and lean protein.  Do not eat a lot of foods that are high in solid fats, added sugars, or sodium.   Maintain a healthy weight Body mass index (BMI) is used to identify weight problems. It estimates body fat based on height and weight. Your health care provider can help determine your BMI and help you achieve or maintain a healthy weight. Get regular exercise Get regular exercise. This is one of the most important things you can do for your health. Most adults should:  Exercise for at least 150 minutes each week. The exercise should increase your heart rate and make you sweat (moderate-intensity exercise).  Do strengthening exercises at least twice a week. This is in addition to the moderate-intensity exercise.  Spend less time sitting. Even light physical activity can be beneficial. Watch cholesterol and blood lipids Have your blood tested for lipids and cholesterol at 48 years of age, then have this test every 5 years. Have your cholesterol levels checked more often if:  Your lipid or cholesterol levels are high.  You are older than 48 years of age.  You are at high risk for heart disease. What should I know about cancer  screening? Depending on your health history and family history, you may need to have cancer screening at various ages. This may include screening for:  Breast cancer.  Cervical cancer.  Colorectal cancer.  Skin cancer.  Lung cancer. What should I know about heart disease, diabetes, and high blood pressure? Blood pressure and heart disease  High blood pressure causes heart disease and increases the risk of stroke. This is more likely to develop in people who have high blood pressure readings, are of African descent, or are overweight.  Have your blood pressure checked: ? Every 3-5 years if you are 79-68 years of age. ? Every year if you are 9 years old or older. Diabetes Have regular diabetes screenings. This checks your fasting blood sugar level. Have the screening done:  Once every three years after age 67 if you are at a normal weight and have a low risk for diabetes.  More often and at a younger age if you are overweight or have a high risk for diabetes. What should I know about preventing infection? Hepatitis B If you have a higher risk for hepatitis B, you should be screened for this virus. Talk with your health care provider to find out if you are at risk for hepatitis B infection. Hepatitis C Testing is recommended for:  Everyone born from [birth year] through 1963.  Anyone with known risk factors for hepatitis C. Sexually transmitted infections (STIs)  Get screened for STIs, including gonorrhea and chlamydia, if: ? You are sexually active and are younger  than 48 years of age. ? You are older than 48 years of age and your health care provider tells you that you are at risk for this type of infection. ? Your sexual activity has changed since you were last screened, and you are at increased risk for chlamydia or gonorrhea. Ask your health care provider if you are at risk.  Ask your health care provider about whether you are at high risk for HIV. Your health care provider may  recommend a prescription medicine to help prevent HIV infection. If you choose to take medicine to prevent HIV, you should first get tested for HIV. You should then be tested every 3 months for as long as you are taking the medicine. Pregnancy  If you are about to stop having your period (premenopausal) and you may become pregnant, seek counseling before you get pregnant.  Take 400 to 800 micrograms (mcg) of folic acid every day if you become pregnant.  Ask for birth control (contraception) if you want to prevent pregnancy. Osteoporosis and menopause Osteoporosis is a disease in which the bones lose minerals and strength with aging. This can result in bone fractures. If you are 21 years old or older, or if you are at risk for osteoporosis and fractures, ask your health care provider if you should:  Be screened for bone loss.  Take a calcium or vitamin D supplement to lower your risk of fractures.  Be given hormone replacement therapy (HRT) to treat symptoms of menopause. Follow these instructions at home: Lifestyle  Do not use any products that contain nicotine or tobacco, such as cigarettes, e-cigarettes, and chewing tobacco. If you need help quitting, ask your health care provider.  Do not use street drugs.  Do not share needles.  Ask your health care provider for help if you need support or information about quitting drugs. Alcohol use  Do not drink alcohol if: ? Your health care provider tells you not to drink. ? You are pregnant, may be pregnant, or are planning to become pregnant.  If you drink alcohol: ? Limit how much you use to 0-1 drink a day. ? Limit intake if you are breastfeeding.  Be aware of how much alcohol is in your drink. In the [place] one drink equals one 12 oz bottle of beer (355 mL), one 5 oz glass of wine (148 mL), or one 1 oz glass of hard liquor (44 mL). General instructions  Schedule regular health, dental, and eye exams.  Stay current with your  vaccines.  Tell your health care provider if: ? You often feel depressed. ? You have ever been abused or do not feel safe at home. Summary  Adopting a healthy lifestyle and getting preventive care are important in promoting health and wellness.  Follow your health care provider's instructions about healthy diet, exercising, and getting tested or screened for diseases.  Follow your health care provider's instructions on monitoring your cholesterol and blood pressure. This information is not intended to replace advice given to you by your health care provider. Make sure you discuss any questions you have with your health care provider. Document Revised: 10/17/2018 Document Reviewed: 10/17/2018 Elsevier Patient Education  2021 [employer].

## 2021-03-28 NOTE — Progress Notes (Signed)
Subjective:    Patient ID: Olivia Frederick, female    DOB: May 21, 1971, 50 y.o.   MRN: [id number]   This visit occurred during the SARS-CoV-2 public health emergency.  Safety protocols were in place, including screening questions prior to the visit, additional usage of staff PPE, and extensive cleaning of exam room while observing appropriate contact time as indicated for disinfecting solutions.    HPI She is here for a physical exam.    She is doing very well and has no concerns.      Medications and allergies reviewed with patient and updated if appropriate.  Patient Active Problem List   Diagnosis Date Noted  . H/O bilateral mastectomy 03/26/2020  . Allergic rhinitis 01/22/2018  . Family history of diabetes mellitus (DM) 01/22/2018  . 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 Breast cancer, right ([place]) 06/27/2017  . Genetic testing 04/24/2017  . Monoallelic mutation of CHEK2 gene in female patient 04/24/2017  . Malignant neoplasm of upper-outer quadrant of right breast in female, estrogen receptor positive ([place]) 02/02/2017    Current Outpatient Medications on File Prior to Visit  Medication Sig Dispense Refill  . tamoxifen (NOLVADEX) 20 MG tablet TAKE 1 TABLET(20 MG) BY MOUTH DAILY 90 tablet 4  . [DISCONTINUED] prochlorperazine (COMPAZINE) 10 MG tablet Take 1 tablet (10 mg total) by mouth every 6 (six) hours as needed (Nausea or vomiting). 30 tablet 1   No current facility-administered medications on file prior to visit.    Past Medical History:  Diagnosis Date  . Genetic testing 04/24/2017   Olivia Frederick underwent genetic counseling and testing for hereditary cancer syndromes on 03/08/2017. 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   The remaining 45 genes analyzed by [employer]'s 46-gene Common Hereditary Cancers Panel were negative for mutations. Genes analyzed include: APC, ATM, AXIN2, BARD1, BMPR1A,  BRCA1, BRCA2, BRIP1, CDH1, CDKN2  . History of breast cancer 2018   right  . History of chemotherapy    finished chemo 05/2017    Past Surgical History:  Procedure Laterality Date  . BREAST RECONSTRUCTION WITH PLACEMENT OF TISSUE EXPANDER AND FLEX HD (ACELLULAR HYDRATED DERMIS) Bilateral 06/27/2017   Procedure: BILATERAL BREAST RECONSTRUCTION WITH PLACEMENT OF TISSUE EXPANDER AND ALLODERM;  Surgeon: Irene Limbo, MD;  Location: [facility];  Service: Plastics;  Laterality: Bilateral;  . CESAREAN SECTION     x 2  . NIPPLE SPARING MASTECTOMY/SENTINAL LYMPH NODE BIOPSY/RECONSTRUCTION/PLACEMENT OF TISSUE EXPANDER Bilateral 06/27/2017   Procedure: BILATERAL NIPPLE SPARING MASTECTOMY WITH RIGHT SENTINAL LYMPH NODE BIOPSY;  Surgeon: Excell Seltzer, MD;  Location: [facility];  Service: General;  Laterality: Bilateral;  . REMOVAL OF BILATERAL TISSUE EXPANDERS WITH PLACEMENT OF BILATERAL BREAST IMPLANTS Bilateral 10/02/2017   Procedure: REMOVAL OF BILATERAL TISSUE EXPANDERS WITH PLACEMENT OF BILATERAL BREAST IMPLANTS;  Surgeon: Irene Limbo, MD;  Location: [facility];  Service: Plastics;  Laterality: Bilateral;    Social History   Socioeconomic History  . Marital status: Married    Spouse name: Not on file  . Number of children: 2  . Years of education: Not on file  . Highest education level: Not on file  Occupational History  . Not on file  Tobacco Use  . Smoking status: Former Smoker    Quit date: 02/01/2017    Years since quitting: 4.1  . Smokeless tobacco: Never Used  . Tobacco comment: social smoking for 20 years  Vaping Use  . Vaping Use:  Never used  Substance and Sexual Activity  . Alcohol use: Yes    Comment: 3-4 drinks/week  . Drug use: No  . Sexual activity: Not on file  Other Topics Concern  . Not on file  Social History Narrative   [place]   2 kids 41 (Zach), 10 ( gabby)      Irregular exercise  -- walking/running with daughter   Social Determinants of Health   Financial Resource Strain: Not on file  Food Insecurity: Not on file  Transportation Needs: Not on file  Physical Activity: Not on file  Stress: Not on file  Social Connections: Not on file    Family History  Problem Relation Age of Onset  . Hypertension Mother   . Breast cancer Mother        diagnosed after her  . Thyroid disease Mother   . Other Mother        carotid artery dis  . Diabetes Father   . Leukemia Paternal Grandfather        d.64  . Breast cancer Cousin 40       Bilateral breast cancer. Daughter of paternal uncle with leukemia.  . Leukemia Paternal Uncle 8  . Lung cancer Maternal Uncle 74  . Diabetes Brother   . Brain cancer Maternal Aunt 65       d.66  . Cancer Maternal Uncle        unspecified type  . Brain cancer Cousin 75       d.55 daughter of maternal aunt with brain cancer    Review of Systems  Constitutional: Negative for chills and fever.  Eyes: Negative for visual disturbance.  Respiratory: Negative for cough, shortness of breath and wheezing.   Cardiovascular: Negative for chest pain, palpitations and leg swelling.  Gastrointestinal: Negative for abdominal pain, blood in stool, constipation, diarrhea and nausea.       No gerd  Genitourinary: Positive for menstrual problem (irregular menses). Negative for dysuria and vaginal discharge.       Vulvar itch  Musculoskeletal: Negative for arthralgias and back pain.  Skin: Negative for color change and rash.  Neurological: Negative for light-headedness and headaches.  Psychiatric/Behavioral: Negative for dysphoric mood. The patient is not nervous/anxious.        Objective:   Vitals:   03/29/21 1256  BP: 116/78  Pulse: 66  Temp: 98.2 F (36.8 C)  SpO2: 99%   Filed Weights   03/29/21 1256  Weight: 143 lb (64.9 kg)   Body mass index is 23.08 kg/m.  BP Readings from Last 3 Encounters:  03/29/21 116/78  07/22/20  119/74  03/27/20 122/78    Wt Readings from Last 3 Encounters:  03/29/21 143 lb (64.9 kg)  07/22/20 140 lb 8 oz (63.7 kg)  03/27/20 137 lb 9.6 oz (62.4 kg)    Depression screen [facility] 2/9 03/29/2021 03/27/2020 03/26/2019 07/31/2017  Decreased Interest 0 0 0 0  Down, Depressed, Hopeless 0 0 0 0  PHQ - 2 Score 0 0 0 0  Altered sleeping 0 - - -  Tired, decreased energy 0 - - -  Change in appetite 0 - - -  Feeling bad or failure about yourself  0 - - -  Trouble concentrating 0 - - -  Moving slowly or fidgety/restless 0 - - -  Suicidal thoughts 0 - - -  PHQ-9 Score 0 - - -    GAD 7 : Generalized Anxiety Score 03/29/2021  Nervous, Anxious, on Edge  0  Control/stop worrying 0  Worry too much - different things 0  Trouble relaxing 0  Restless 0  Easily annoyed or irritable 0  Afraid - awful might happen 0  Total GAD 7 Score 0       Physical Exam Constitutional: She appears well-developed and well-nourished. No distress.  HENT:  Head: Normocephalic and atraumatic.  Right Ear: External ear normal. Normal ear canal and TM Left Ear: External ear normal.  Normal ear canal and TM Mouth/Throat: Oropharynx is clear and moist.  Eyes: Conjunctivae and EOM are normal.  Neck: Neck supple. No tracheal deviation present. No thyromegaly present.  No carotid bruit  Cardiovascular: Normal rate, regular rhythm and normal heart sounds.   No murmur heard.  No edema. Pulmonary/Chest: Effort normal and breath sounds normal. No respiratory distress. She has no wheezes. She has no rales.  Breast: deferred   Abdominal: Soft. She exhibits no distension. There is no tenderness.  Lymphadenopathy: She has no cervical adenopathy.  Skin: Skin is warm and dry. She is not diaphoretic.  Psychiatric: She has a normal mood and affect. Her behavior is normal.        Assessment & Plan:   Physical exam: Screening blood work    ordered Immunizations  Had 2 Covid vaccines - discussed booster, tdap Colonoscopy   Up to date  Mammogram  n/a Gyn   Up to date - Dr Milta Deiters - June has appt Exercise  Regular  Weight  Good  Substance abuse   none   Screened for depression using the PHQ 9 scale.  No evidence of depression.   Screened for anxiety using GAD7 Scale.  No evidence of anxiety.    See Problem List for Assessment and Plan of chronic medical problems.

## 2021-03-29 ENCOUNTER — Ambulatory Visit (INDEPENDENT_AMBULATORY_CARE_PROVIDER_SITE_OTHER): Payer: 59 | Admitting: Internal Medicine

## 2021-03-29 ENCOUNTER — Other Ambulatory Visit: Payer: Self-pay

## 2021-03-29 ENCOUNTER — Encounter: Payer: Self-pay | Admitting: Internal Medicine

## 2021-03-29 VITALS — BP 116/78 | HR 66 | Temp 98.2°F | Ht 66.0 in | Wt 143.0 lb

## 2021-03-29 DIAGNOSIS — Z0000 Encounter for general adult medical examination without abnormal findings: Secondary | ICD-10-CM | POA: Diagnosis not present

## 2021-03-29 DIAGNOSIS — L292 Pruritus vulvae: Secondary | ICD-10-CM

## 2021-03-29 DIAGNOSIS — Z833 Family history of diabetes mellitus: Secondary | ICD-10-CM

## 2021-03-29 DIAGNOSIS — C50411 Malignant neoplasm of upper-outer quadrant of right female breast: Secondary | ICD-10-CM

## 2021-03-29 LAB — COMPREHENSIVE METABOLIC PANEL
ALT: 10 U/L (ref 0–35)
AST: 17 U/L (ref 0–37)
Albumin: 4.4 g/dL (ref 3.5–5.2)
Alkaline Phosphatase: 37 U/L — ABNORMAL LOW (ref 39–117)
BUN: 8 mg/dL (ref 6–23)
CO2: 29 mEq/L (ref 19–32)
Calcium: 9.7 mg/dL (ref 8.4–10.5)
Chloride: 104 mEq/L (ref 96–112)
Creatinine, Ser: 0.77 mg/dL (ref 0.40–1.20)
GFR: 91.79 mL/min (ref >60.00)
Glucose, Bld: 92 mg/dL (ref 70–99)
Potassium: 4.1 mEq/L (ref 3.5–5.1)
Sodium: 141 mEq/L (ref 135–145)
Total Bilirubin: 0.5 mg/dL (ref 0.2–1.2)
Total Protein: 7.5 g/dL (ref 6.0–8.3)

## 2021-03-29 LAB — LIPID PANEL
Cholesterol: 179 mg/dL (ref 0–200)
HDL: 68.5 mg/dL (ref >39.00)
LDL Cholesterol: 96 mg/dL (ref 0–99)
NonHDL: 110.09
Total CHOL/HDL Ratio: 3
Triglycerides: 70 mg/dL (ref 0.0–149.0)
VLDL: 14 mg/dL (ref 0.0–40.0)

## 2021-03-29 LAB — CBC WITH DIFFERENTIAL/PLATELET
Basophils Absolute: 0.1 10*3/uL (ref 0.0–0.1)
Basophils Relative: 0.6 % (ref 0.0–3.0)
Eosinophils Absolute: 0.3 10*3/uL (ref 0.0–0.7)
Eosinophils Relative: 2.6 % (ref 0.0–5.0)
HCT: 36.5 % (ref 36.0–46.0)
Hemoglobin: 12.7 g/dL (ref 12.0–15.0)
Lymphocytes Relative: 22.8 % (ref 12.0–46.0)
Lymphs Abs: 2.4 10*3/uL (ref 0.7–4.0)
MCHC: 34.8 g/dL (ref 30.0–36.0)
MCV: 81.6 fl (ref 78.0–100.0)
Monocytes Absolute: 0.8 10*3/uL (ref 0.1–1.0)
Monocytes Relative: 7.6 % (ref 3.0–12.0)
Neutro Abs: 7 10*3/uL (ref 1.4–7.7)
Neutrophils Relative %: 66.4 % (ref 43.0–77.0)
Platelets: 308 10*3/uL (ref 150.0–400.0)
RBC: 4.48 Mil/uL (ref 3.87–5.11)
RDW: 13.8 % (ref 11.5–15.5)
WBC: 10.6 10*3/uL — ABNORMAL HIGH (ref 4.0–10.5)

## 2021-03-29 LAB — HEMOGLOBIN A1C: Hgb A1c MFr Bld: 5.7 % (ref 4.6–6.5)

## 2021-03-29 LAB — TSH: TSH: 1.24 u[IU]/mL (ref 0.35–4.50)

## 2021-03-29 MED ORDER — FLUCONAZOLE 150 MG PO TABS: 150.0000 mg | ORAL_TABLET | Freq: Once | ORAL | 0 refills | Status: AC

## 2021-03-29 NOTE — Assessment & Plan Note (Signed)
Acute Taking monistat - today is day 2 - slightly better - wishes for a quicker treatment Will rx diflucan 150 mg x1 Discussed - this may be related to skin changes (lichen sclerosis) since her menses are irregular and tends to come with periods Has appt with gyn next month and will discuss with him

## 2021-03-29 NOTE — Assessment & Plan Note (Signed)
Chronic Check a1c 

## 2021-03-29 NOTE — Assessment & Plan Note (Signed)
Chronic Following with oncology Taking tamoxifen daily

## 2021-06-08 ENCOUNTER — Telehealth: Payer: Self-pay

## 2021-06-08 NOTE — Telephone Encounter (Signed)
Not sure what this is related to

## 2021-06-10 NOTE — Telephone Encounter (Signed)
Patient states her oncologist actually had the form and would be completing it for her.

## 2021-07-20 ENCOUNTER — Inpatient Hospital Stay: Payer: 59 | Attending: Oncology

## 2021-07-20 ENCOUNTER — Other Ambulatory Visit: Payer: Self-pay

## 2021-07-20 DIAGNOSIS — Z1509 Genetic susceptibility to other malignant neoplasm: Secondary | ICD-10-CM

## 2021-07-20 DIAGNOSIS — G620 Drug-induced polyneuropathy: Secondary | ICD-10-CM | POA: Diagnosis not present

## 2021-07-20 DIAGNOSIS — Z801 Family history of malignant neoplasm of trachea, bronchus and lung: Secondary | ICD-10-CM | POA: Diagnosis not present

## 2021-07-20 DIAGNOSIS — Z808 Family history of malignant neoplasm of other organs or systems: Secondary | ICD-10-CM | POA: Diagnosis not present

## 2021-07-20 DIAGNOSIS — Z170 Estrogen receptor positive status [ER+]: Secondary | ICD-10-CM | POA: Insufficient documentation

## 2021-07-20 DIAGNOSIS — C50911 Malignant neoplasm of unspecified site of right female breast: Secondary | ICD-10-CM

## 2021-07-20 DIAGNOSIS — C50511 Malignant neoplasm of lower-outer quadrant of right female breast: Secondary | ICD-10-CM | POA: Insufficient documentation

## 2021-07-20 DIAGNOSIS — C50919 Malignant neoplasm of unspecified site of unspecified female breast: Secondary | ICD-10-CM

## 2021-07-20 DIAGNOSIS — Z803 Family history of malignant neoplasm of breast: Secondary | ICD-10-CM | POA: Insufficient documentation

## 2021-07-20 DIAGNOSIS — F17210 Nicotine dependence, cigarettes, uncomplicated: Secondary | ICD-10-CM | POA: Insufficient documentation

## 2021-07-20 DIAGNOSIS — Z806 Family history of leukemia: Secondary | ICD-10-CM | POA: Insufficient documentation

## 2021-07-20 DIAGNOSIS — C50411 Malignant neoplasm of upper-outer quadrant of right female breast: Secondary | ICD-10-CM

## 2021-07-20 LAB — CBC WITH DIFFERENTIAL/PLATELET
Abs Immature Granulocytes: 0.01 10*3/uL (ref 0.00–0.07)
Basophils Absolute: 0 10*3/uL (ref 0.0–0.1)
Basophils Relative: 0 %
Eosinophils Absolute: 0.1 10*3/uL (ref 0.0–0.5)
Eosinophils Relative: 1 %
HCT: 34 % — ABNORMAL LOW (ref 36.0–46.0)
Hemoglobin: 11.9 g/dL — ABNORMAL LOW (ref 12.0–15.0)
Immature Granulocytes: 0 %
Lymphocytes Relative: 29 %
Lymphs Abs: 2.3 10*3/uL (ref 0.7–4.0)
MCH: 27.7 pg (ref 26.0–34.0)
MCHC: 35 g/dL (ref 30.0–36.0)
MCV: 79.3 fL — ABNORMAL LOW (ref 80.0–100.0)
Monocytes Absolute: 0.9 10*3/uL (ref 0.1–1.0)
Monocytes Relative: 11 %
Neutro Abs: 4.5 10*3/uL (ref 1.7–7.7)
Neutrophils Relative %: 59 %
Platelets: 251 10*3/uL (ref 150–400)
RBC: 4.29 MIL/uL (ref 3.87–5.11)
RDW: 13.1 % (ref 11.5–15.5)
WBC: 7.7 10*3/uL (ref 4.0–10.5)
nRBC: 0 % (ref 0.0–0.2)

## 2021-07-20 LAB — COMPREHENSIVE METABOLIC PANEL
ALT: 11 U/L (ref 0–44)
AST: 17 U/L (ref 15–41)
Albumin: 4.2 g/dL (ref 3.5–5.0)
Alkaline Phosphatase: 34 U/L — ABNORMAL LOW (ref 38–126)
Anion gap: 10 (ref 5–15)
BUN: 7 mg/dL (ref 6–20)
CO2: 26 mmol/L (ref 22–32)
Calcium: 9.6 mg/dL (ref 8.9–10.3)
Chloride: 104 mmol/L (ref 98–111)
Creatinine, Ser: 0.93 mg/dL (ref 0.44–1.00)
GFR, Estimated: >60 mL/min (ref >60)
Glucose, Bld: 94 mg/dL (ref 70–99)
Potassium: 3.9 mmol/L (ref 3.5–5.1)
Sodium: 140 mmol/L (ref 135–145)
Total Bilirubin: 0.6 mg/dL (ref 0.3–1.2)
Total Protein: 7.3 g/dL (ref 6.5–8.1)

## 2021-07-21 LAB — IRON AND TIBC
Iron: 120 ug/dL (ref 41–142)
Saturation Ratios: 31 % (ref 21–57)
TIBC: 383 ug/dL (ref 236–444)
UIBC: 262 ug/dL (ref 120–384)

## 2021-07-21 LAB — FERRITIN: Ferritin: 29 ng/mL (ref 11–307)

## 2021-07-22 ENCOUNTER — Inpatient Hospital Stay (HOSPITAL_BASED_OUTPATIENT_CLINIC_OR_DEPARTMENT_OTHER): Payer: 59 | Admitting: Oncology

## 2021-07-22 ENCOUNTER — Encounter: Payer: Self-pay | Admitting: Emergency Medicine

## 2021-07-22 ENCOUNTER — Encounter: Payer: Self-pay | Admitting: Oncology

## 2021-07-22 ENCOUNTER — Other Ambulatory Visit: Payer: Self-pay

## 2021-07-22 VITALS — BP 125/71 | HR 76 | Temp 97.7°F | Resp 16 | Ht 66.0 in | Wt 145.9 lb

## 2021-07-22 DIAGNOSIS — C50919 Malignant neoplasm of unspecified site of unspecified female breast: Secondary | ICD-10-CM | POA: Diagnosis not present

## 2021-07-22 DIAGNOSIS — T451X5A Adverse effect of antineoplastic and immunosuppressive drugs, initial encounter: Secondary | ICD-10-CM

## 2021-07-22 DIAGNOSIS — C50511 Malignant neoplasm of lower-outer quadrant of right female breast: Secondary | ICD-10-CM | POA: Diagnosis not present

## 2021-07-22 DIAGNOSIS — Z1502 Genetic susceptibility to malignant neoplasm of ovary: Secondary | ICD-10-CM

## 2021-07-22 DIAGNOSIS — C50911 Malignant neoplasm of unspecified site of right female breast: Secondary | ICD-10-CM

## 2021-07-22 DIAGNOSIS — Z1589 Genetic susceptibility to other disease: Secondary | ICD-10-CM

## 2021-07-22 DIAGNOSIS — G620 Drug-induced polyneuropathy: Secondary | ICD-10-CM | POA: Diagnosis not present

## 2021-07-22 DIAGNOSIS — Z1509 Genetic susceptibility to other malignant neoplasm: Secondary | ICD-10-CM

## 2021-07-22 DIAGNOSIS — C50411 Malignant neoplasm of upper-outer quadrant of right female breast: Secondary | ICD-10-CM

## 2021-07-22 DIAGNOSIS — Z1501 Genetic susceptibility to malignant neoplasm of breast: Secondary | ICD-10-CM

## 2021-07-22 DIAGNOSIS — Z170 Estrogen receptor positive status [ER+]: Secondary | ICD-10-CM

## 2021-07-22 DIAGNOSIS — D563 Thalassemia minor: Secondary | ICD-10-CM

## 2021-07-22 MED ORDER — TAMOXIFEN CITRATE 20 MG PO TABS: ORAL_TABLET | ORAL | 4 refills | Status: DC

## 2021-07-22 NOTE — Research (Signed)
[facility]-[id number] - TREATMENT OF ESTABLISHED CHEMOTHERAPY-INDUCED NEUROPATHY WITH N-PALMITOYLETHANOLAMIDE, A CANNABIMIMETIC NUTRACEUTICAL: A RANDOMIZED DOUBLE-BLIND PHASE II PILOT TRIAL  07/22/21   Patient Olivia Frederick was identified by Dr. Jana Hakim as a potential candidate for the above listed study.  This Clinical Research Coordinator met with [name], [id number], on 07/22/21 in a manner and location that ensures patient privacy to discuss participation in the above listed research study.  Patient is Unaccompanied.  A copy of the informed consent document with embedded HIPAA language was provided to the patient.  Patient reads, speaks, and understands [place].    Patient was provided with the business card of this Coordinator and encouraged to contact the research team with any questions.  Approximately 5 minutes were spent with the patient reviewing the informed consent documents.  Patient was provided the option of taking informed consent documents home to review and was encouraged to review at their convenience with their support network, including other care providers. Patient took the consent documents home to review.  The patient currently rates her neuropathy symptoms as a 5 on a scale of 0-10 in the past week.  Patient requests a follow up phone call next week.  Clabe Seal Clinical Research Coordinator I  07/22/21 4:08 PM

## 2021-07-22 NOTE — Progress Notes (Signed)
[place]  [phone]) [phone] [phone]    ID: ARDYN FORGE DOB: [birth year]  MR#: [id number]  OVZ#:[id number]  Patient Care Team: Binnie Rail, MD as PCP - General (Internal Medicine) Excell Seltzer, MD (Inactive) as Consulting Physician (General Surgery) Ashvin Adelson, Virgie Dad, MD as Consulting Physician (Oncology) Gery Pray, MD as Consulting Physician (Radiation Oncology) Juanita Craver, MD as Consulting Physician (Gastroenterology) Delice Bison, Charlestine Massed, NP as Nurse Practitioner (Hematology and Oncology) Chauncey Cruel, MD OTHER MD:  CHIEF COMPLAINT: Triple positive breast cancer (s/p bilateral mastectomies)  CURRENT TREATMENT: tamoxifen   INTERVAL HISTORY: Saphire returns today for follow up of her triple positive CHEK2 associated breast cancer.  She continues on tamoxifen.  She tolerates this well, however for the past few months she has started to have more hot flashes.  He has also noted that her periods are scant clear and less frequent, occurring perhaps every 3 months.  She is likely entering menopause.  REVIEW OF SYSTEMS: Rafaela tells me she has gained a little weight.  She exercises by walking.  She has a swollen left big toe and the nail she says may be coming loose.  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  A detailed review of systems today was otherwise stable   COVID 19 VACCINATION STATUS: [place] x2, no booster as of September 2022   HISTORY OF CURRENT ILLNESS: From the original intake note:   Dalaina noted a change in her right breast around November 2017. She was not very concerned about it but did mention it at the time of mammography which had been scheduled for 01/30/2017. Accordingly she had bilateral diagnostic mammography with tomography and right breast ultrasonography that day at the [facility]. In the right breast there was an irregular mass measuring approximately 5 cm. On exam  this was firm and palpable at the 12:00 location 2 cm from the nipple targeted ultrasonography showed an area of mixed echogenicity superiorly measuring at least 4.6 cm with a similar appearing adjacent area measuring 1.3 cm thought to be contiguous ultrasound of the right axilla showed one ymph node with cortical thickening.   Biopsy of the right breast mass 01/31/2017 showed ([id number]) invasive ductal carcinoma, grade 2, estrogen receptor 100% positive, with strong staining intensity, progesterone receptor 25% positive, with moderate staining intensity, with an MIB-1 of 25%, and HER-2 amplified with a signals ratio of 2.35, the number per cell being 4.0. Biopsy of the suspicious axillary lymph node showed only reactive   Her subsequent history is as detailed below   PAST MEDICAL HISTORY: Past Medical History:  Diagnosis Date   Genetic testing 04/24/2017   [name] underwent genetic counseling and testing for hereditary cancer syndromes on 03/08/2017. 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   The remaining 45 genes analyzed by [facility]'s 46-gene Common Hereditary Cancers Panel were negative for mutations. Genes analyzed include: APC, ATM, AXIN2, BARD1, BMPR1A, BRCA1, BRCA2, BRIP1, CDH1, CDKN2   History of breast cancer 2018   right   History of chemotherapy    finished chemo 05/2017    PAST SURGICAL HISTORY: Past Surgical History:  Procedure Laterality Date   BREAST RECONSTRUCTION WITH PLACEMENT OF TISSUE EXPANDER AND FLEX HD (ACELLULAR HYDRATED DERMIS) Bilateral 06/27/2017   Procedure: BILATERAL BREAST RECONSTRUCTION WITH PLACEMENT OF TISSUE EXPANDER AND ALLODERM;  Surgeon: Irene Limbo, MD;  Location: [facility];  Service: Plastics;  Laterality: Bilateral;   CESAREAN SECTION  x 2   NIPPLE SPARING MASTECTOMY/SENTINAL LYMPH NODE BIOPSY/RECONSTRUCTION/PLACEMENT OF TISSUE EXPANDER Bilateral 06/27/2017    Procedure: BILATERAL NIPPLE SPARING MASTECTOMY WITH RIGHT SENTINAL LYMPH NODE BIOPSY;  Surgeon: Excell Seltzer, MD;  Location: [facility];  Service: General;  Laterality: Bilateral;   REMOVAL OF BILATERAL TISSUE EXPANDERS WITH PLACEMENT OF BILATERAL BREAST IMPLANTS Bilateral 10/02/2017   Procedure: REMOVAL OF BILATERAL TISSUE EXPANDERS WITH PLACEMENT OF BILATERAL BREAST IMPLANTS;  Surgeon: Irene Limbo, MD;  Location: [facility];  Service: Plastics;  Laterality: Bilateral;    FAMILY HISTORY: Family History  Problem Relation Age of Onset   Hypertension Mother    Breast cancer Mother        diagnosed after her   Thyroid disease Mother    Other Mother        carotid artery dis   Diabetes Father    Leukemia Paternal Grandfather        d.64   Breast cancer Cousin 25       Bilateral breast cancer. Daughter of paternal uncle with leukemia.   Leukemia Paternal Uncle 29   Lung cancer Maternal Uncle 59   Diabetes Brother    Brain cancer Maternal Aunt 65       d.66   Cancer Maternal Uncle        unspecified type   Brain cancer Cousin 68       d.55 daughter of maternal aunt with brain cancer   The patient's parents are living, both in their early 52s as of April 2018. She has one brother and one sister. On the paternal side a grandfather was diagnosed with leukemia at age 27, an uncle also with leukemia at age 49, another uncle at age 50 with colon cancer, and a cousin with breast cancer at age 29. On the mother's side there was an uncle with cancer of some type, not known to the patient, diagnosed age 36    GYNECOLOGIC HISTORY:  No LMP recorded. (Menstrual status: Chemotherapy). Menarche: 50 years old Age at first live birth: 50 years old [place] P 2 LMP Her periods were briefly interrupted with chemotherapy but resumed on October 2019. Contraceptive never used HRT n/a  Hysterectomy? no BSO? 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  Her husband runs a [employer] . Son [name] is 13, daughter Janace Hoard is 76, as of April 2018.    ADVANCED DIRECTIVES: In the absence of any documentation to the contrary, the patient's spouse is their HCPOA.    HEALTH MAINTENANCE: Social History   Tobacco Use   Smoking status: Former    Types: Cigarettes    Quit date: 02/01/2017    Years since quitting: 4.4   Smokeless tobacco: Never   Tobacco comments:    social smoking for 20 years  Vaping Use   Vaping Use: Never used  Substance Use Topics   Alcohol use: Yes    Comment: 3-4 drinks/week   Drug use: No     Colonoscopy: n/a (age)  PAP: not on file  Bone density: n/a (age)   No Known Allergies  Current Outpatient Medications  Medication Sig Dispense Refill   tamoxifen (NOLVADEX) 20 MG tablet TAKE 1 TABLET(20 MG) BY MOUTH DAILY 90 tablet 4   No current facility-administered medications for this visit.    OBJECTIVE: African-American woman who appears well  Vitals:   07/22/21 1448  BP: 125/71  Pulse: 76  Resp: 16  Temp: 97.7 F (  36.5 C)  SpO2: 97%     Body mass index is 23.55 kg/m.   Wt Readings from Last 3 Encounters:  07/22/21 145 lb 14.4 oz (66.2 kg)  03/29/21 143 lb (64.9 kg)  07/22/20 140 lb 8 oz (63.7 kg)      ECOG FS:1 - Symptomatic but completely ambulatory  Sclerae unicteric, EOMs intact Wearing a mask No cervical or supraclavicular adenopathy Lungs no rales or rhonchi Heart regular rate and rhythm Abd soft, nontender, positive bowel sounds MSK no focal spinal tenderness, no upper extremity lymphedema; toe in question appears slightly swollen but not erythematous, and the nail is not distorted or loose and yet does not appear normal Neuro: nonfocal, well oriented, appropriate affect Breasts: Status post bilateral nipple sparing mastectomies with silicone implant reconstruction.  There is no evidence of disease recurrence.  Both axillae are benign.   LAB RESULTS:  CMP     Component  Value Date/Time   NA 140 07/20/2021 1602   NA 141 10/26/2017 1442   K 3.9 07/20/2021 1602   K 4.3 10/26/2017 1442   CL 104 07/20/2021 1602   CO2 26 07/20/2021 1602   CO2 26 10/26/2017 1442   GLUCOSE 94 07/20/2021 1602   GLUCOSE 91 10/26/2017 1442   BUN 7 07/20/2021 1602   BUN 8.1 10/26/2017 1442   CREATININE 0.93 07/20/2021 1602   CREATININE 0.86 07/22/2019 1504   CREATININE 0.8 10/26/2017 1442   CALCIUM 9.6 07/20/2021 1602   CALCIUM 9.4 10/26/2017 1442   PROT 7.3 07/20/2021 1602   PROT 7.3 10/26/2017 1442   ALBUMIN 4.2 07/20/2021 1602   ALBUMIN 4.0 10/26/2017 1442   AST 17 07/20/2021 1602   AST 14 (L) 07/22/2019 1504   AST 13 10/26/2017 1442   ALT 11 07/20/2021 1602   ALT 10 07/22/2019 1504   ALT 8 10/26/2017 1442   ALKPHOS 34 (L) 07/20/2021 1602   ALKPHOS 49 10/26/2017 1442   BILITOT 0.6 07/20/2021 1602   BILITOT 0.3 07/22/2019 1504   BILITOT 0.31 10/26/2017 1442   GFRNONAA >60 07/20/2021 1602   GFRNONAA >60 07/22/2019 1504   GFRAA >60 07/22/2020 1504   GFRAA >60 07/22/2019 1504    No results found for: TOTALPROTELP, ALBUMINELP, A1GS, A2GS, BETS, BETA2SER, GAMS, MSPIKE, SPEI  Lab Results  Component Value Date   WBC 7.7 07/20/2021   NEUTROABS 4.5 07/20/2021   HGB 11.9 (L) 07/20/2021   HCT 34.0 (L) 07/20/2021   MCV 79.3 (L) 07/20/2021   PLT 251 07/20/2021    No results found for: LABCA2  No components found for: HENIDP824  No results for input(s): INR in the last 168 hours.  No results found for: LABCA2  No results found for: MPN361  No results found for: WER154  No results found for: MGQ676  No results found for: CA2729  No components found for: HGQUANT  No results found for: CEA1 / No results found for: CEA1   No results found for: AFPTUMOR  No results found for: CHROMOGRNA  No results found for: KPAFRELGTCHN, LAMBDASER, KAPLAMBRATIO (kappa/lambda light chains)  No results found for: HGBA, HGBA2QUANT, HGBFQUANT,  HGBSQUAN (Hemoglobinopathy evaluation)   No results found for: LDH  Lab Results  Component Value Date   IRON 120 07/20/2021   TIBC 383 07/20/2021   IRONPCTSAT 31 07/20/2021   (Iron and TIBC)  Lab Results  Component Value Date   FERRITIN 29 07/20/2021    Urinalysis    Component Value Date/Time   COLORURINE yellow 01/22/2010  [place] 01/22/2010 1457   LABSPEC 1.015 01/22/2010 1457   PHURINE 5.0 01/22/2010 [place] 05/24/2007 1404   HGBUR trace-lysed 01/22/2010 1457   BILIRUBINUR negative 01/22/2010 [place] 05/24/2007 1404   PROTEINUR NEGATIVE 05/24/2007 1404   UROBILINOGEN 0.2 01/22/2010 1457   NITRITE negative 01/22/2010 1457   LEUKOCYTESUR  05/24/2007 1404    NEGATIVE MICROSCOPIC NOT DONE ON URINES WITH NEGATIVE PROTEIN, BLOOD, LEUKOCYTES, NITRITE, OR GLUCOSE <1000 mg/dL.    STUDIES: No results found.   ELIGIBLE FOR AVAILABLE RESEARCH PROTOCOL: Yes, neuropathy study  ASSESSMENT: 50 y.o. CHEK2 positive [place] woman status post right breast biopsy 02/01/2017 for a cT2 pN0, stage IB invasive ductal carcinoma, grade 2, estrogen and progesterone receptor positive, with an MIB-1 of 25%, and HER-2 amplified              (a) biopsy of a suspicious right axillary lymph node 01/24/2017 showed only reactive lymphoid tissue             (b) biopsy of right breast lower outer quadrant 02/17/2017 showed invasive ductal carcinoma, grade 3, estrogen and progesterone receptor positive, HER-2 not amplified, with an Mib-1 of 50%   (1) genetics testing 46-gene Common Hereditary Cancer Panel offered by [facility] identified a single, heterozygous pathogenic gene mutation called CHEK2, c.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 There were no additional mutations in the remaining 45 genes analyzed: APC, ATM, AXIN2, BARD1, BMPR1A, BRCA1, BRCA2, BRIP1, CDH1, CDKN2A, CTNNA1, DICER1, EPCAM, GREM1, HOXB13, KIT, MEN1, MLH1, MSH2, MSH3, MSH6, MUTYH, NBN, NF1,  NTHL1, PALB2, PDGFRA, PMS2, POLD1, POLE, PTEN, RAD50, RAD51C, RAD51D, SDHA, SDHB, SDHC, SDHD, SMAD4, SMARCA4, STK11, TP53, TSC1, TSC2, and VHL.             (a) will need either bilateral mastectomies or intensified screening             (b) colon cancer screening started 2018 (colonoscopy) under Dr. Collene Mares   (2) neoadjuvant chemotherapy with carboplatin, docetaxel, and trastuzumab with pertuzumab given every 3 weeks 6, starting 02/16/2017, completed 06/01/2017   (3) anti-HER-2 immunotherapy completed to total 1 year (through 02/08/2018)             (a) baseline echocardiogram 02/13/2017 found an ejection fraction of 55-60%             (b) echocardiogram 05/23/2017 found an ejection fraction of 55-60%.             (c) echo on 08/18/2017 demonstrates LVEF of 60-65%             (d) echo on 11/27/2017 showed an ejection fraction in the 55-60 % range\             (e) echocardiogram on 03/06/2018 showed an ejection fraction in the 55-60% range    (4) status post bilateral nipple sparing mastectomies with right sentinel lymph node sampling 06/27/2017, showing             (a) on the left, no evidence of malignancy             (b) on the right, residual ympT2 ypN0 invasive ductal carcinoma, grade 2, with ample margins             (c) status post bilateral silicone implant placement 10/02/2017   (5) Offered adjuvant radiation by Dr. Sondra Come, however after thoughtful discussion, patient declined   (6) Tamoxifen started September 2018.  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  This is very favorable.  She is tolerating tamoxifen well.  The plan is to continue that a minimum of 5  years.    I reviewed her MCVs all of which are in the 78-80 range in the setting of normal iron studies.  We discussed what thalassemia is and what it is not, namely it is not a disease but a condition.  It is inherited and if she wishes to know whether her children also have it all she needs to do is look at their complete blood counts and check the MCVs.  I think she would benefit from a podiatry referral and went over how she may obtain this.  I sent a note to her GI doctor, Dr. Collene Mares, suggesting that colonoscopy next year may be prudent.  Finally Alda Berthold does have neuropathy secondary to her chemotherapy.  I think she would be a good candidate for our PEA study and that referral was placed  Total encounter time 35 minutes.Sarajane Jews C. Letisha Yera, MD 07/22/2021 6:03 PM Medical Oncology and Hematology [facility] [place], [place] [address] Tel. [phone]    Fax. [phone]   This document serves as a record of services personally performed by Lurline Del, MD. It was created on his behalf by Wilburn Mylar, a trained medical scribe. The creation of this record is based on the scribe's personal observations and the provider's statements to them.   I, Lurline Del MD, have reviewed the above documentation for accuracy and completeness, and I agree with the above.   *Total Encounter Time as defined by the Centers for Medicare and Medicaid Services includes, in addition to the face-to-face time of a patient visit (documented in the note above) non-face-to-face time: obtaining and reviewing outside history, ordering and reviewing medications, tests or procedures, care coordination (communications with other health care professionals or caregivers) and documentation in the medical record.

## 2021-07-26 ENCOUNTER — Telehealth: Payer: Self-pay | Admitting: Emergency Medicine

## 2021-07-26 NOTE — Telephone Encounter (Signed)
[facility]-[place]-2102 - TREATMENT OF ESTABLISHED CHEMOTHERAPY-INDUCED NEUROPATHY WITH N-PALMITOYLETHANOLAMIDE, A CANNABIMIMETIC NUTRACEUTICAL: A RANDOMIZED DOUBLE-BLIND PHASE II PILOT TRIAL  07/26/21  Called to follow up concerning this patient's interest in this study.  Patient states she has not had time to review the informed consent documents and would like some more time to consider the study.  She was given this coordinator's contact information and advised to call with any questions.   Clabe Seal Clinical Research Coordinator I  07/26/21 1:53 PM

## 2021-08-09 ENCOUNTER — Telehealth: Payer: Self-pay | Admitting: Emergency Medicine

## 2021-08-09 NOTE — Telephone Encounter (Signed)
[facility]-[place]-2102 - TREATMENT OF ESTABLISHED CHEMOTHERAPY-INDUCED NEUROPATHY WITH N-PALMITOYLETHANOLAMIDE, A CANNABIMIMETIC NUTRACEUTICAL: A RANDOMIZED DOUBLE-BLIND PHASE II PILOT TRIAL  08/09/21  2:03pm: Called to follow up concerning this study.  Patient did not answer, left voicemail requesting call.   Clabe Seal Clinical Research Coordinator I  08/09/21 2:05 PM

## 2021-08-10 ENCOUNTER — Other Ambulatory Visit: Payer: Self-pay | Admitting: Oncology

## 2021-08-10 DIAGNOSIS — C50411 Malignant neoplasm of upper-outer quadrant of right female breast: Secondary | ICD-10-CM

## 2021-08-10 DIAGNOSIS — Z1589 Genetic susceptibility to other disease: Secondary | ICD-10-CM

## 2021-08-10 DIAGNOSIS — Z170 Estrogen receptor positive status [ER+]: Secondary | ICD-10-CM

## 2021-08-10 DIAGNOSIS — C50911 Malignant neoplasm of unspecified site of right female breast: Secondary | ICD-10-CM

## 2021-08-10 DIAGNOSIS — C50919 Malignant neoplasm of unspecified site of unspecified female breast: Secondary | ICD-10-CM

## 2021-08-16 ENCOUNTER — Telehealth: Payer: Self-pay | Admitting: Emergency Medicine

## 2021-08-16 NOTE — Telephone Encounter (Signed)
[facility]-[place]-2102 - TREATMENT OF ESTABLISHED CHEMOTHERAPY-INDUCED NEUROPATHY WITH N-PALMITOYLETHANOLAMIDE, A CANNABIMIMETIC NUTRACEUTICAL: A RANDOMIZED DOUBLE-BLIND PHASE II PILOT TRIAL  08/16/21  3:45pm: Called to follow up concerning this study.  Patient did not answer, left voicemail requesting call.   Clabe Seal Clinical Research Coordinator I  08/16/21 3:46 PM

## 2021-08-18 ENCOUNTER — Telehealth: Payer: Self-pay | Admitting: Emergency Medicine

## 2021-08-18 NOTE — Telephone Encounter (Signed)
[facility]-[place]-2102 - TREATMENT OF ESTABLISHED CHEMOTHERAPY-INDUCED NEUROPATHY WITH N-PALMITOYLETHANOLAMIDE, A CANNABIMIMETIC NUTRACEUTICAL: A RANDOMIZED DOUBLE-BLIND PHASE II PILOT TRIAL  08/18/21  This patient called to follow up concerning this study.  Patient states she has decided to not participate in the study at this time.  She states she doesn't want to start taking another medication at the moment.  The patient has this coordinators contact information states she will call if she changes her mind regarding this study.  Clabe Seal Clinical Research Coordinator I  08/18/21  12:25 PM

## 2021-08-23 ENCOUNTER — Encounter: Payer: Self-pay | Admitting: Podiatry

## 2021-08-23 ENCOUNTER — Other Ambulatory Visit: Payer: Self-pay

## 2021-08-23 ENCOUNTER — Ambulatory Visit (INDEPENDENT_AMBULATORY_CARE_PROVIDER_SITE_OTHER): Payer: 59 | Admitting: Podiatry

## 2021-08-23 DIAGNOSIS — L603 Nail dystrophy: Secondary | ICD-10-CM | POA: Diagnosis not present

## 2021-08-23 MED ORDER — UREA NAIL 45 % EX GEL: 1.0000 g | Freq: Every day | CUTANEOUS | 5 refills | Status: DC

## 2021-08-23 NOTE — Progress Notes (Signed)
  Subjective:  Patient ID: Olivia Frederick, female    DOB: [birth year],   MRN: [id number]  Chief Complaint  Patient presents with   Peripheral Neuropathy    Hallux bilateral - neuropathy concerns-chemo induced-mainly numbness in big toes, but also some tingling in the tips of the other toes, noticed toenails have thickened and some discomfort, any medications for nails or neuropathy, questions regarding bunions bilateral as well.   New Patient (Initial Visit)    50 y.o. female presents for dystrophic painful nails and numbness in the toes that have been present since 2018 when she had chemotherapy. Here today to discuss treatment options for the numbness and the thickened toenails.  . Denies any other pedal complaints. Denies n/v/f/c.   Past Medical History:  Diagnosis Date   Genetic testing 04/24/2017   [name] underwent genetic counseling and testing for hereditary cancer syndromes on 03/08/2017. 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   The remaining 45 genes analyzed by [facility]'s 46-gene Common Hereditary Cancers Panel were negative for mutations. Genes analyzed include: APC, ATM, AXIN2, BARD1, BMPR1A, BRCA1, BRCA2, BRIP1, CDH1, CDKN2   History of breast cancer 2018   right   History of chemotherapy    finished chemo 05/2017    Objective:  Physical Exam: Vascular: DP/PT pulses 2/4 bilateral. CFT <3 seconds. Normal hair growth on digits. No edema.  Skin. No lacerations or abrasions bilateral feet. Hallux nails bilateral are thickened discolored and elongated with subungual debris.  Musculoskeletal: MMT 5/5 bilateral lower extremities in DF, PF, Inversion and Eversion. Deceased ROM in DF of ankle joint.  Neurological: Sensation intact to light touch. Protective sensation intact bilateral.   Assessment:   1. Onychodystrophy      Plan:  Patient was evaluated and treated and all questions answered. -Discussed and  educated patient on dystrophic nails and chemotherapy. -Mechanically debrided all nails 1 bilateral using sterile nail nipper and filed with dremel without incident as courtesy. -Answered all patient questions -Prescription for urea nail gel provided to help with nail thickness.  -Patient to return as needed.  -Patient advised to call the office if any problems or questions arise in the meantime.   Lorenda Peck, DPM

## 2021-09-02 ENCOUNTER — Other Ambulatory Visit: Payer: Self-pay | Admitting: Sports Medicine

## 2021-09-02 ENCOUNTER — Telehealth: Payer: Self-pay | Admitting: *Deleted

## 2021-09-02 MED ORDER — UREA NAIL 45 % EX GEL: 1.0000 g | Freq: Every day | CUTANEOUS | 5 refills | Status: DC

## 2021-09-02 NOTE — Progress Notes (Signed)
Reordered urea nail gel for patient

## 2021-09-02 NOTE — Telephone Encounter (Signed)
Patient is calling because medication ordered on 08/23/21 has not been released by physician yet per pharmacy. Please advise.

## 2021-09-08 NOTE — Telephone Encounter (Signed)
Patient notified of prescription.

## 2021-09-09 ENCOUNTER — Telehealth: Payer: Self-pay | Admitting: Podiatry

## 2021-09-09 NOTE — Telephone Encounter (Signed)
Patient called and lvm stating that she did not receive the prescription that was sent to her pharmacy. She stated that she spoke with someone and they stated that it was re-sent again but the pharmacy stated that they have not received it. They offered her to pay out of pocket but she has medical insurance.   Please advise

## 2022-03-29 ENCOUNTER — Encounter: Payer: Self-pay | Admitting: Internal Medicine

## 2022-03-29 NOTE — Patient Instructions (Addendum)
Blood work was ordered.     Medications changes include :  scopolamine patch    Your prescription(s) have been sent to your pharmacy.     Return in about 1 year (around 03/31/2023) for Physical Exam.   Health Maintenance, Female Adopting a healthy lifestyle and getting preventive care are important in promoting health and wellness. Ask your health care provider about: The right schedule for you to have regular tests and exams. Things you can do on your own to prevent diseases and keep yourself healthy. What should I know about diet, weight, and exercise? Eat a healthy diet  Eat a diet that includes plenty of vegetables, fruits, low-fat dairy products, and lean protein. Do not eat a lot of foods that are high in solid fats, added sugars, or sodium. Maintain a healthy weight Body mass index (BMI) is used to identify weight problems. It estimates body fat based on height and weight. Your health care provider can help determine your BMI and help you achieve or maintain a healthy weight. Get regular exercise Get regular exercise. This is one of the most important things you can do for your health. Most adults should: Exercise for at least 150 minutes each week. The exercise should increase your heart rate and make you sweat (moderate-intensity exercise). Do strengthening exercises at least twice a week. This is in addition to the moderate-intensity exercise. Spend less time sitting. Even light physical activity can be beneficial. Watch cholesterol and blood lipids Have your blood tested for lipids and cholesterol at 49 years of age, then have this test every 5 years. Have your cholesterol levels checked more often if: Your lipid or cholesterol levels are high. You are older than 49 years of age. You are at high risk for heart disease. What should I know about cancer screening? Depending on your health history and family history, you may need to have cancer screening at various  ages. This may include screening for: Breast cancer. Cervical cancer. Colorectal cancer. Skin cancer. Lung cancer. What should I know about heart disease, diabetes, and high blood pressure? Blood pressure and heart disease High blood pressure causes heart disease and increases the risk of stroke. This is more likely to develop in people who have high blood pressure readings or are overweight. Have your blood pressure checked: Every 3-5 years if you are 38-36 years of age. Every year if you are 4 years old or older. Diabetes Have regular diabetes screenings. This checks your fasting blood sugar level. Have the screening done: Once every three years after age 57 if you are at a normal weight and have a low risk for diabetes. More often and at a younger age if you are overweight or have a high risk for diabetes. What should I know about preventing infection? Hepatitis B If you have a higher risk for hepatitis B, you should be screened for this virus. Talk with your health care provider to find out if you are at risk for hepatitis B infection. Hepatitis C Testing is recommended for: Everyone born from [birth year] through 1965. Anyone with known risk factors for hepatitis C. Sexually transmitted infections (STIs) Get screened for STIs, including gonorrhea and chlamydia, if: You are sexually active and are younger than 49 years of age. You are older than 49 years of age and your health care provider tells you that you are at risk for this type of infection. Your sexual activity has changed since you were last screened, and you  are at increased risk for chlamydia or gonorrhea. Ask your health care provider if you are at risk. Ask your health care provider about whether you are at high risk for HIV. Your health care provider may recommend a prescription medicine to help prevent HIV infection. If you choose to take medicine to prevent HIV, you should first get tested for HIV. You should then be tested  every 3 months for as long as you are taking the medicine. Pregnancy If you are about to stop having your period (premenopausal) and you may become pregnant, seek counseling before you get pregnant. Take 400 to 800 micrograms (mcg) of folic acid every day if you become pregnant. Ask for birth control (contraception) if you want to prevent pregnancy. Osteoporosis and menopause Osteoporosis is a disease in which the bones lose minerals and strength with aging. This can result in bone fractures. If you are 11 years old or older, or if you are at risk for osteoporosis and fractures, ask your health care provider if you should: Be screened for bone loss. Take a calcium or vitamin D supplement to lower your risk of fractures. Be given hormone replacement therapy (HRT) to treat symptoms of menopause. Follow these instructions at home: Alcohol use Do not drink alcohol if: Your health care provider tells you not to drink. You are pregnant, may be pregnant, or are planning to become pregnant. If you drink alcohol: Limit how much you have to: 0-1 drink a day. Know how much alcohol is in your drink. In the [place] one drink equals one 12 oz bottle of beer (355 mL), one 5 oz glass of wine (148 mL), or one 1 oz glass of hard liquor (44 mL). Lifestyle Do not use any products that contain nicotine or tobacco. These products include cigarettes, chewing tobacco, and vaping devices, such as e-cigarettes. If you need help quitting, ask your health care provider. Do not use street drugs. Do not share needles. Ask your health care provider for help if you need support or information about quitting drugs. General instructions Schedule regular health, dental, and eye exams. Stay current with your vaccines. Tell your health care provider if: You often feel depressed. You have ever been abused or do not feel safe at home. Summary Adopting a healthy lifestyle and getting preventive care are important in promoting  health and wellness. Follow your health care provider's instructions about healthy diet, exercising, and getting tested or screened for diseases. Follow your health care provider's instructions on monitoring your cholesterol and blood pressure. This information is not intended to replace advice given to you by your health care provider. Make sure you discuss any questions you have with your health care provider. Document Revised: 03/15/2021 Document Reviewed: 03/15/2021 Elsevier Patient Education  [place].

## 2022-03-29 NOTE — Progress Notes (Unsigned)
Subjective:    Patient ID: Olivia Frederick, female    DOB: 1971-09-09, 51 y.o.   MRN: [id number]      HPI Viki is here for No chief complaint on file.      Medications and allergies reviewed with patient and updated if appropriate.  Current Outpatient Medications on File Prior to Visit  Medication Sig Dispense Refill   tamoxifen (NOLVADEX) 20 MG tablet TAKE 1 TABLET(20 MG) BY MOUTH DAILY 90 tablet 4   Urea (UREA NAIL) 45 % GEL Apply 1 g topically daily. 28 mL 5   [DISCONTINUED] prochlorperazine (COMPAZINE) 10 MG tablet Take 1 tablet (10 mg total) by mouth every 6 (six) hours as needed (Nausea or vomiting). 30 tablet 1   No current facility-administered medications on file prior to visit.    Review of Systems     Objective:  There were no vitals filed for this visit. There were no vitals filed for this visit. There is no height or weight on file to calculate BMI.  BP Readings from Last 3 Encounters:  07/22/21 125/71  03/29/21 116/78  07/22/20 119/74    Wt Readings from Last 3 Encounters:  07/22/21 145 lb 14.4 oz (66.2 kg)  03/29/21 143 lb (64.9 kg)  07/22/20 140 lb 8 oz (63.7 kg)       03/29/2021    1:28 PM 03/27/2020    1:34 PM 03/26/2019    1:26 PM 07/31/2017    9:38 AM  Depression screen PHQ 2/9  Decreased Interest 0 0 0 0  Down, Depressed, Hopeless 0 0 0 0  PHQ - 2 Score 0 0 0 0  Altered sleeping 0     Tired, decreased energy 0     Change in appetite 0     Feeling bad or failure about yourself  0     Trouble concentrating 0     Moving slowly or fidgety/restless 0     Suicidal thoughts 0     PHQ-9 Score 0           03/29/2021    1:28 PM  GAD 7 : Generalized Anxiety Score  Nervous, Anxious, on Edge 0  Control/stop worrying 0  Worry too much - different things 0  Trouble relaxing 0  Restless 0  Easily annoyed or irritable 0  Afraid - awful might happen 0  Total GAD 7 Score 0        Physical Exam Constitutional: She appears  well-developed and well-nourished. No distress.  HENT:  Head: Normocephalic and atraumatic.  Right Ear: External ear normal. Normal ear canal and TM Left Ear: External ear normal.  Normal ear canal and TM Mouth/Throat: Oropharynx is clear and moist.  Eyes: Conjunctivae normal.  Neck: Neck supple. No tracheal deviation present. No thyromegaly present.  No carotid bruit  Cardiovascular: Normal rate, regular rhythm and normal heart sounds.   No murmur heard.  No edema. Pulmonary/Chest: Effort normal and breath sounds normal. No respiratory distress. She has no wheezes. She has no rales.  Breast: deferred   Abdominal: Soft. She exhibits no distension. There is no tenderness.  Lymphadenopathy: She has no cervical adenopathy.  Skin: Skin is warm and dry. She is not diaphoretic.  Psychiatric: She has a normal mood and affect. Her behavior is normal.     Lab Results  Component Value Date   WBC 7.7 07/20/2021   HGB 11.9 (L) 07/20/2021   HCT 34.0 (L) 07/20/2021   PLT 251 07/20/2021  GLUCOSE 94 07/20/2021   CHOL 179 03/29/2021   TRIG 70.0 03/29/2021   HDL 68.50 03/29/2021   LDLCALC 96 03/29/2021   ALT 11 07/20/2021   AST 17 07/20/2021   NA 140 07/20/2021   K 3.9 07/20/2021   CL 104 07/20/2021   CREATININE 0.93 07/20/2021   BUN 7 07/20/2021   CO2 26 07/20/2021   TSH 1.24 03/29/2021   HGBA1C 5.7 03/29/2021         Assessment & Plan:   Physical exam: Screening blood work  ordered Exercise   Weight   Substance abuse  none   Reviewed recommended immunizations.   Health Maintenance  Topic Date Due   TETANUS/TDAP  Never done   PAP SMEAR-Modifier  11/13/2017   COVID-19 Vaccine (3 - Pfizer risk series) 08/17/2020   HIV Screening  03/26/2059 (Originally 09/17/1988)   Hepatitis C Screening  03/29/2064 (Originally 09/18/1991)   INFLUENZA VACCINE  06/07/2022   COLONOSCOPY (Pts 45-83yr Insurance coverage will need to be confirmed)  10/19/2025   HPV VACCINES  Aged Out           See Problem List for Assessment and Plan of chronic medical problems.

## 2022-03-30 ENCOUNTER — Ambulatory Visit (INDEPENDENT_AMBULATORY_CARE_PROVIDER_SITE_OTHER): Payer: 59 | Admitting: Internal Medicine

## 2022-03-30 VITALS — BP 122/78 | HR 74 | Temp 98.2°F | Ht 66.0 in | Wt 143.0 lb

## 2022-03-30 DIAGNOSIS — C50411 Malignant neoplasm of upper-outer quadrant of right female breast: Secondary | ICD-10-CM | POA: Diagnosis not present

## 2022-03-30 DIAGNOSIS — Z833 Family history of diabetes mellitus: Secondary | ICD-10-CM | POA: Diagnosis not present

## 2022-03-30 DIAGNOSIS — C50919 Malignant neoplasm of unspecified site of unspecified female breast: Secondary | ICD-10-CM

## 2022-03-30 DIAGNOSIS — Z0000 Encounter for general adult medical examination without abnormal findings: Secondary | ICD-10-CM

## 2022-03-30 DIAGNOSIS — D563 Thalassemia minor: Secondary | ICD-10-CM

## 2022-03-30 LAB — COMPREHENSIVE METABOLIC PANEL
ALT: 11 U/L (ref 0–35)
AST: 15 U/L (ref 0–37)
Albumin: 4.6 g/dL (ref 3.5–5.2)
Alkaline Phosphatase: 37 U/L — ABNORMAL LOW (ref 39–117)
BUN: 11 mg/dL (ref 6–23)
CO2: 28 mEq/L (ref 19–32)
Calcium: 9.8 mg/dL (ref 8.4–10.5)
Chloride: 102 mEq/L (ref 96–112)
Creatinine, Ser: 0.83 mg/dL (ref 0.40–1.20)
GFR: 83.3 mL/min (ref >60.00)
Glucose, Bld: 99 mg/dL (ref 70–99)
Potassium: 3.9 mEq/L (ref 3.5–5.1)
Sodium: 138 mEq/L (ref 135–145)
Total Bilirubin: 0.7 mg/dL (ref 0.2–1.2)
Total Protein: 7.7 g/dL (ref 6.0–8.3)

## 2022-03-30 LAB — CBC WITH DIFFERENTIAL/PLATELET
Basophils Absolute: 0.1 10*3/uL (ref 0.0–0.1)
Basophils Relative: 0.9 % (ref 0.0–3.0)
Eosinophils Absolute: 0.1 10*3/uL (ref 0.0–0.7)
Eosinophils Relative: 1.9 % (ref 0.0–5.0)
HCT: 37.5 % (ref 36.0–46.0)
Hemoglobin: 12.9 g/dL (ref 12.0–15.0)
Lymphocytes Relative: 34.9 % (ref 12.0–46.0)
Lymphs Abs: 2.6 10*3/uL (ref 0.7–4.0)
MCHC: 34.3 g/dL (ref 30.0–36.0)
MCV: 80.3 fl (ref 78.0–100.0)
Monocytes Absolute: 0.8 10*3/uL (ref 0.1–1.0)
Monocytes Relative: 10.9 % (ref 3.0–12.0)
Neutro Abs: 3.8 10*3/uL (ref 1.4–7.7)
Neutrophils Relative %: 51.4 % (ref 43.0–77.0)
Platelets: 271 10*3/uL (ref 150.0–400.0)
RBC: 4.67 Mil/uL (ref 3.87–5.11)
RDW: 14.3 % (ref 11.5–15.5)
WBC: 7.4 10*3/uL (ref 4.0–10.5)

## 2022-03-30 LAB — TSH: TSH: 1.2 u[IU]/mL (ref 0.35–5.50)

## 2022-03-30 LAB — LIPID PANEL
Cholesterol: 192 mg/dL (ref 0–200)
HDL: 65 mg/dL (ref >39.00)
LDL Cholesterol: 118 mg/dL — ABNORMAL HIGH (ref 0–99)
NonHDL: 126.87
Total CHOL/HDL Ratio: 3
Triglycerides: 43 mg/dL (ref 0.0–149.0)
VLDL: 8.6 mg/dL (ref 0.0–40.0)

## 2022-03-30 LAB — HEMOGLOBIN A1C: Hgb A1c MFr Bld: 5.6 % (ref 4.6–6.5)

## 2022-03-30 MED ORDER — SCOPOLAMINE 1 MG/3DAYS TD PT72: 1.0000 | MEDICATED_PATCH | TRANSDERMAL | 0 refills | Status: DC

## 2022-03-30 NOTE — Assessment & Plan Note (Addendum)
Chronic CBC 

## 2022-03-30 NOTE — Assessment & Plan Note (Deleted)
Positive for Chek-2 Check TSH

## 2022-03-30 NOTE — Assessment & Plan Note (Signed)
Chronic Following with oncology On tamoxifen

## 2022-03-30 NOTE — Assessment & Plan Note (Signed)
Family history of diabetes Check A1c Encourage regular exercise, low sugar/carbohydrate diet

## 2022-05-18 ENCOUNTER — Telehealth: Payer: Self-pay | Admitting: Internal Medicine

## 2022-05-18 ENCOUNTER — Telehealth: Payer: Self-pay | Admitting: *Deleted

## 2022-05-18 NOTE — Telephone Encounter (Signed)
Pt called in and wants to know if she can have an accomodation form filled out to work from home.   States if the accomodation is not granted, she would like a letter that states under working conditions, allow her to sit to be away from people.   Informed pt that MD is out of office. Requesting call back from assistant ASAP.

## 2022-05-18 NOTE — Telephone Encounter (Signed)
Received call from pt requesting accommodations paperwork be completed by MD for pt to continue to work from home.  RN reviewed with MD and due to pt no longer being under active tx and the pandemic being over, our office can not provide pt with updated work from home accommodations.  Pt educated and verbalized understanding.

## 2022-05-18 NOTE — Telephone Encounter (Signed)
Spoke with patient today and she will bring form on Friday.

## 2022-05-26 ENCOUNTER — Telehealth: Payer: Self-pay

## 2022-05-26 NOTE — Telephone Encounter (Signed)
Left message for patient to return call to clinic regarding paperwork request to work from home.  If she calls back please transfer to me so I can determine if she needs to come back into the office for a follow up or not.

## 2022-07-21 NOTE — Progress Notes (Signed)
Patient Care Team: Binnie Rail, MD as PCP - General (Internal Medicine) Excell Seltzer, MD (Inactive) as Consulting Physician (General Surgery) Magrinat, Virgie Dad, MD (Inactive) as Consulting Physician (Oncology) Gery Pray, MD as Consulting Physician (Radiation Oncology) Juanita Craver, MD as Consulting Physician (Gastroenterology) Delice Bison Charlestine Massed, NP as Nurse Practitioner (Hematology and Oncology)  DIAGNOSIS: No diagnosis found.  SUMMARY OF ONCOLOGIC HISTORY: Oncology History  Malignant neoplasm of upper-outer quadrant of right breast in female, estrogen receptor positive ([place])  01/31/2017 Initial Biopsy   Right bresat biopsy: IDC, grade 2, ER+(100%), PR+(25%), Ki-67 25%m HER-2+(ratio 2.35). 1 axillary lymph node was negative for malignancy.    02/02/2017 Initial Diagnosis   Malignant neoplasm of upper-outer quadrant of right breast in female, estrogen receptor positive ([place])   02/16/2017 - 06/01/2017 Neo-Adjuvant Chemotherapy   Docetaxel, Carboplatin, Trastuzumab, Pertuzumab x 6  Pertuzumab omitted with cycle 5 and 6, Docetaxel omitted with cycle 6.   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   The remaining 45 genes analyzed by [facility]'s 46-gene Common Hereditary Cancers Panel were negative for mutations. Genes analyzed include: APC, ATM, AXIN2, BARD1, BMPR1A, BRCA1, BRCA2, BRIP1, CDH1, CDKN2A, CHEK2, CTNNA1, DICER1, EPCAM, GREM1, HOXB13, KIT, MEN1, MLH1, MSH2, MSH3, MSH6, MUTYH, NBN, NF1, NTHL1, PALB2, PDGFRA, PMS2, POLD1, POLE, PTEN, RAD50, RAD51C, RAD51D, SDHA, SDHB, SDHC, SDHD, SMAD4, SMARCA4, STK11, TP53, TSC1, TSC2, and VHL. The result report is dated for 03/28/2017.   06/22/2017 -  Adjuvant Chemotherapy   Maintenance Trastuzumab to  complete one year   06/27/2017 Surgery   Bilateral nipple sparing Mastectomies with right SLNB: left benign, Right: . 3 foci of residual IDC, 3.2cm, 1.6cm, and 0.7 cm, DCIS, LVI, margins negative, 7 LN negative. ER+(95%), PR+(10%), HER-2+(ratio 2.03).    07/2017 -  Anti-estrogen oral therapy   Tamoxifen daily     CHIEF COMPLIANT: Follow-up triple positive breast cancer  INTERVAL HISTORY: Olivia Frederick is a   ALLERGIES:  has No Known Allergies.  MEDICATIONS:  Current Outpatient Medications  Medication Sig Dispense Refill   scopolamine (TRANSDERM-SCOP) 1 MG/3DAYS Place 1 patch (1.5 mg total) onto the skin every 3 (three) days. 4 patch 0   tamoxifen (NOLVADEX) 20 MG tablet TAKE 1 TABLET(20 MG) BY MOUTH DAILY 90 tablet 4   No current facility-administered medications for this visit.    PHYSICAL EXAMINATION: ECOG PERFORMANCE STATUS: {CHL ONC ECOG PS:[id number]}  There were no vitals filed for this visit. There were no vitals filed for this visit.  BREAST:*** No palpable masses or nodules in either right or left breasts. No palpable axillary supraclavicular or infraclavicular adenopathy no breast tenderness or nipple discharge. (exam performed in the presence of a chaperone)  LABORATORY DATA:  I have reviewed the data as listed    Latest Ref Rng & Units 03/30/2022    1:32 PM 07/20/2021    4:02 PM 03/29/2021    1:31 PM  CMP  Glucose 70 - 99 mg/dL 99  94  92   BUN 6 - 23 mg/dL '11  7  8   ' Creatinine 0.40 - 1.20 mg/dL 0.83  0.93  0.77   Sodium 135 - 145 mEq/L 138  140  141   Potassium 3.5 - 5.1 mEq/L 3.9  3.9  4.1   Chloride 96 - 112 mEq/L 102  104  104   CO2 19 - 32 mEq/L '28  26  29   ' Calcium 8.4 - 10.5 mg/dL 9.8  9.6  9.7   Total Protein 6.0 - 8.3 g/dL 7.7  7.3  7.5   Total Bilirubin 0.2 - 1.2 mg/dL 0.7  0.6  0.5   Alkaline Phos 39 - 117 U/L 37  34  37   AST 0 - 37 U/L '15  17  17   ' ALT 0 - 35 U/L '11  11  10     ' Lab Results  Component Value Date   WBC 7.4  03/30/2022   HGB 12.9 03/30/2022   HCT 37.5 03/30/2022   MCV 80.3 03/30/2022   PLT 271.0 03/30/2022   NEUTROABS 3.8 03/30/2022    ASSESSMENT & PLAN:  No problem-specific Assessment & Plan notes found for this encounter.    No orders of the defined types were placed in this encounter.  The patient has a good understanding of the overall plan. she agrees with it. she will call with any problems that may develop before the next visit here. Total time spent: 30 mins including face to face time and time spent for planning, charting and co-ordination of care   Suzzette Righter, [place] 07/21/22    I Gardiner Coins am scribing for Dr. Lindi Adie  ***

## 2022-07-25 ENCOUNTER — Other Ambulatory Visit: Payer: Self-pay

## 2022-07-25 ENCOUNTER — Ambulatory Visit: Payer: 59 | Admitting: Hematology and Oncology

## 2022-07-25 ENCOUNTER — Other Ambulatory Visit: Payer: 59

## 2022-07-25 DIAGNOSIS — C50411 Malignant neoplasm of upper-outer quadrant of right female breast: Secondary | ICD-10-CM

## 2022-07-25 DIAGNOSIS — T451X5A Adverse effect of antineoplastic and immunosuppressive drugs, initial encounter: Secondary | ICD-10-CM

## 2022-07-26 ENCOUNTER — Inpatient Hospital Stay (HOSPITAL_BASED_OUTPATIENT_CLINIC_OR_DEPARTMENT_OTHER): Payer: 59 | Admitting: Hematology and Oncology

## 2022-07-26 ENCOUNTER — Other Ambulatory Visit: Payer: Self-pay

## 2022-07-26 ENCOUNTER — Inpatient Hospital Stay: Payer: 59 | Attending: Hematology and Oncology

## 2022-07-26 DIAGNOSIS — Z170 Estrogen receptor positive status [ER+]: Secondary | ICD-10-CM

## 2022-07-26 DIAGNOSIS — Z1509 Genetic susceptibility to other malignant neoplasm: Secondary | ICD-10-CM

## 2022-07-26 DIAGNOSIS — C50911 Malignant neoplasm of unspecified site of right female breast: Secondary | ICD-10-CM | POA: Diagnosis not present

## 2022-07-26 DIAGNOSIS — Z1589 Genetic susceptibility to other disease: Secondary | ICD-10-CM

## 2022-07-26 DIAGNOSIS — C50411 Malignant neoplasm of upper-outer quadrant of right female breast: Secondary | ICD-10-CM

## 2022-07-26 DIAGNOSIS — Z79810 Long term (current) use of selective estrogen receptor modulators (SERMs): Secondary | ICD-10-CM | POA: Insufficient documentation

## 2022-07-26 DIAGNOSIS — Z1502 Genetic susceptibility to malignant neoplasm of ovary: Secondary | ICD-10-CM

## 2022-07-26 DIAGNOSIS — C50919 Malignant neoplasm of unspecified site of unspecified female breast: Secondary | ICD-10-CM

## 2022-07-26 DIAGNOSIS — Z853 Personal history of malignant neoplasm of breast: Secondary | ICD-10-CM | POA: Insufficient documentation

## 2022-07-26 DIAGNOSIS — Z9013 Acquired absence of bilateral breasts and nipples: Secondary | ICD-10-CM | POA: Diagnosis not present

## 2022-07-26 LAB — CBC WITH DIFFERENTIAL (CANCER CENTER ONLY)
Abs Immature Granulocytes: 0.02 10*3/uL (ref 0.00–0.07)
Basophils Absolute: 0.1 10*3/uL (ref 0.0–0.1)
Basophils Relative: 1 %
Eosinophils Absolute: 0.1 10*3/uL (ref 0.0–0.5)
Eosinophils Relative: 1 %
HCT: 35 % — ABNORMAL LOW (ref 36.0–46.0)
Hemoglobin: 12.7 g/dL (ref 12.0–15.0)
Immature Granulocytes: 0 %
Lymphocytes Relative: 27 %
Lymphs Abs: 2.1 10*3/uL (ref 0.7–4.0)
MCH: 28.6 pg (ref 26.0–34.0)
MCHC: 36.3 g/dL — ABNORMAL HIGH (ref 30.0–36.0)
MCV: 78.8 fL — ABNORMAL LOW (ref 80.0–100.0)
Monocytes Absolute: 0.5 10*3/uL (ref 0.1–1.0)
Monocytes Relative: 7 %
Neutro Abs: 5.1 10*3/uL (ref 1.7–7.7)
Neutrophils Relative %: 64 %
Platelet Count: 258 10*3/uL (ref 150–400)
RBC: 4.44 MIL/uL (ref 3.87–5.11)
RDW: 12.8 % (ref 11.5–15.5)
WBC Count: 7.8 10*3/uL (ref 4.0–10.5)
nRBC: 0 % (ref 0.0–0.2)

## 2022-07-26 LAB — IRON AND IRON BINDING CAPACITY (CC-WL,HP ONLY)
Iron: 106 ug/dL (ref 28–170)
Saturation Ratios: 25 % (ref 10.4–31.8)
TIBC: 427 ug/dL (ref 250–450)
UIBC: 321 ug/dL (ref 148–442)

## 2022-07-26 LAB — CMP (CANCER CENTER ONLY)
ALT: 10 U/L (ref 0–44)
AST: 15 U/L (ref 15–41)
Albumin: 4.4 g/dL (ref 3.5–5.0)
Alkaline Phosphatase: 32 U/L — ABNORMAL LOW (ref 38–126)
Anion gap: 6 (ref 5–15)
BUN: 6 mg/dL (ref 6–20)
CO2: 29 mmol/L (ref 22–32)
Calcium: 9.4 mg/dL (ref 8.9–10.3)
Chloride: 105 mmol/L (ref 98–111)
Creatinine: 0.72 mg/dL (ref 0.44–1.00)
GFR, Estimated: >60 mL/min (ref >60)
Glucose, Bld: 154 mg/dL — ABNORMAL HIGH (ref 70–99)
Potassium: 3.6 mmol/L (ref 3.5–5.1)
Sodium: 140 mmol/L (ref 135–145)
Total Bilirubin: 0.5 mg/dL (ref 0.3–1.2)
Total Protein: 7.2 g/dL (ref 6.5–8.1)

## 2022-07-26 LAB — FERRITIN: Ferritin: 22 ng/mL (ref 11–307)

## 2022-07-26 MED ORDER — TAMOXIFEN CITRATE 20 MG PO TABS: ORAL_TABLET | ORAL | 4 refills | Status: DC

## 2022-07-26 NOTE — Assessment & Plan Note (Addendum)
01/24/2017: T2N0 stage Ib grade 2 IDC ER/PR positive HER2 positive Ki-67 25%, CHEK2 gene mutation 06/01/2017: TCHP x6 cycles 06/27/2017: Bilateral mastectomies: Left: No malignancy, right: Residual T2N0 grade 2 IDC implants 02/08/2018: Adjuvant Herceptin completed Patient declined adjuvant radiation  Current treatment: Tamoxifen started September 2018 Tamoxifen toxicities: Tolerating it extremely well without any problems or concerns.  Denies any hot flashes.  Breast cancer surveillance: 1.  Breast exam 07/26/2022: Benign 2. no role of mammograms since she had bilateral mastectomies 3.  [employer] testing for minimal residual disease  Return to clinic in 1 year for follow-up

## 2022-07-27 ENCOUNTER — Other Ambulatory Visit: Payer: Self-pay

## 2022-07-27 DIAGNOSIS — Z170 Estrogen receptor positive status [ER+]: Secondary | ICD-10-CM

## 2022-07-27 NOTE — Progress Notes (Signed)
Orders entered for signatera testing per MD. Requisition and all supporting documents faxed to [phone] with fax confirmation.   

## 2022-08-08 ENCOUNTER — Encounter: Payer: Self-pay | Admitting: *Deleted

## 2022-08-08 NOTE — Progress Notes (Signed)
Receive a message from [employer] representative stating patient did not respond to their call to schedule mobile phlebotomy draw.  Representative states patient will need to reach out to [employer] directly at [phone] option #[phone] to schedule if patient wishes to proceed.   

## 2022-08-17 ENCOUNTER — Encounter: Payer: Self-pay | Admitting: Hematology and Oncology

## 2022-10-13 ENCOUNTER — Other Ambulatory Visit: Payer: Self-pay | Admitting: *Deleted

## 2022-10-13 MED ORDER — TAMOXIFEN CITRATE 20 MG PO TABS: 20.0000 mg | ORAL_TABLET | Freq: Every day | ORAL | 4 refills | Status: DC

## 2022-11-14 ENCOUNTER — Other Ambulatory Visit: Payer: Self-pay | Admitting: *Deleted

## 2022-11-14 MED ORDER — TAMOXIFEN CITRATE 20 MG PO TABS: 20.0000 mg | ORAL_TABLET | Freq: Every day | ORAL | 3 refills | Status: DC

## 2022-11-15 ENCOUNTER — Encounter: Payer: Self-pay | Admitting: *Deleted

## 2022-11-15 NOTE — Progress Notes (Signed)
Receive a message from [employer] representative stating patient did not respond to their call to schedule mobile phlebotomy draw.  Representative states patient will need to reach out to [employer] directly at [phone] option #[phone] to schedule if patient wishes to proceed.   

## 2023-04-03 ENCOUNTER — Encounter: Payer: Self-pay | Admitting: Internal Medicine

## 2023-04-03 DIAGNOSIS — R7303 Prediabetes: Secondary | ICD-10-CM | POA: Insufficient documentation

## 2023-04-03 DIAGNOSIS — R739 Hyperglycemia, unspecified: Secondary | ICD-10-CM | POA: Insufficient documentation

## 2023-04-03 NOTE — Patient Instructions (Addendum)
Tetanus vaccine today    Blood work was ordered.   The lab is on the first floor.    Medications changes include :   None     Return in about 1 year (around 04/03/2024) for Physical Exam.   Health Maintenance, Female Adopting a healthy lifestyle and getting preventive care are important in promoting health and wellness. Ask your health care provider about: The right schedule for you to have regular tests and exams. Things you can do on your own to prevent diseases and keep yourself healthy. What should I know about diet, weight, and exercise? Eat a healthy diet  Eat a diet that includes plenty of vegetables, fruits, low-fat dairy products, and lean protein. Do not eat a lot of foods that are high in solid fats, added sugars, or sodium. Maintain a healthy weight Body mass index (BMI) is used to identify weight problems. It estimates body fat based on height and weight. Your health care provider can help determine your BMI and help you achieve or maintain a healthy weight. Get regular exercise Get regular exercise. This is one of the most important things you can do for your health. Most adults should: Exercise for at least 150 minutes each week. The exercise should increase your heart rate and make you sweat (moderate-intensity exercise). Do strengthening exercises at least twice a week. This is in addition to the moderate-intensity exercise. Spend less time sitting. Even light physical activity can be beneficial. Watch cholesterol and blood lipids Have your blood tested for lipids and cholesterol at 50 years of age, then have this test every 5 years. Have your cholesterol levels checked more often if: Your lipid or cholesterol levels are high. You are older than 50 years of age. You are at high risk for heart disease. What should I know about cancer screening? Depending on your health history and family history, you may need to have cancer screening at various ages. This  may include screening for: Breast cancer. Cervical cancer. Colorectal cancer. Skin cancer. Lung cancer. What should I know about heart disease, diabetes, and high blood pressure? Blood pressure and heart disease High blood pressure causes heart disease and increases the risk of stroke. This is more likely to develop in people who have high blood pressure readings or are overweight. Have your blood pressure checked: Every 3-5 years if you are 65-41 years of age. Every year if you are 31 years old or older. Diabetes Have regular diabetes screenings. This checks your fasting blood sugar level. Have the screening done: Once every three years after age 45 if you are at a normal weight and have a low risk for diabetes. More often and at a younger age if you are overweight or have a high risk for diabetes. What should I know about preventing infection? Hepatitis B If you have a higher risk for hepatitis B, you should be screened for this virus. Talk with your health care provider to find out if you are at risk for hepatitis B infection. Hepatitis C Testing is recommended for: Everyone born from [birth year] through 1965. Anyone with known risk factors for hepatitis C. Sexually transmitted infections (STIs) Get screened for STIs, including gonorrhea and chlamydia, if: You are sexually active and are younger than 50 years of age. You are older than 50 years of age and your health care provider tells you that you are at risk for this type of infection. Your sexual activity has changed since you were last screened,  and you are at increased risk for chlamydia or gonorrhea. Ask your health care provider if you are at risk. Ask your health care provider about whether you are at high risk for HIV. Your health care provider may recommend a prescription medicine to help prevent HIV infection. If you choose to take medicine to prevent HIV, you should first get tested for HIV. You should then be tested every 3  months for as long as you are taking the medicine. Pregnancy If you are about to stop having your period (premenopausal) and you may become pregnant, seek counseling before you get pregnant. Take 400 to 800 micrograms (mcg) of folic acid every day if you become pregnant. Ask for birth control (contraception) if you want to prevent pregnancy. Osteoporosis and menopause Osteoporosis is a disease in which the bones lose minerals and strength with aging. This can result in bone fractures. If you are 64 years old or older, or if you are at risk for osteoporosis and fractures, ask your health care provider if you should: Be screened for bone loss. Take a calcium or vitamin D supplement to lower your risk of fractures. Be given hormone replacement therapy (HRT) to treat symptoms of menopause. Follow these instructions at home: Alcohol use Do not drink alcohol if: Your health care provider tells you not to drink. You are pregnant, may be pregnant, or are planning to become pregnant. If you drink alcohol: Limit how much you have to: 0-1 drink a day. Know how much alcohol is in your drink. In the [place] one drink equals one 12 oz bottle of beer (355 mL), one 5 oz glass of wine (148 mL), or one 1 oz glass of hard liquor (44 mL). Lifestyle Do not use any products that contain nicotine or tobacco. These products include cigarettes, chewing tobacco, and vaping devices, such as e-cigarettes. If you need help quitting, ask your health care provider. Do not use street drugs. Do not share needles. Ask your health care provider for help if you need support or information about quitting drugs. General instructions Schedule regular health, dental, and eye exams. Stay current with your vaccines. Tell your health care provider if: You often feel depressed. You have ever been abused or do not feel safe at home. Summary Adopting a healthy lifestyle and getting preventive care are important in promoting health  and wellness. Follow your health care provider's instructions about healthy diet, exercising, and getting tested or screened for diseases. Follow your health care provider's instructions on monitoring your cholesterol and blood pressure. This information is not intended to replace advice given to you by your health care provider. Make sure you discuss any questions you have with your health care provider. Document Revised: 03/15/2021 Document Reviewed: 03/15/2021 Elsevier Patient Education  2024 [employer].

## 2023-04-04 ENCOUNTER — Ambulatory Visit (INDEPENDENT_AMBULATORY_CARE_PROVIDER_SITE_OTHER): Payer: 59 | Admitting: Internal Medicine

## 2023-04-04 VITALS — BP 118/74 | HR 80 | Temp 98.2°F | Ht 66.0 in | Wt 150.0 lb

## 2023-04-04 DIAGNOSIS — Z23 Encounter for immunization: Secondary | ICD-10-CM

## 2023-04-04 DIAGNOSIS — C50411 Malignant neoplasm of upper-outer quadrant of right female breast: Secondary | ICD-10-CM | POA: Diagnosis not present

## 2023-04-04 DIAGNOSIS — Z0001 Encounter for general adult medical examination with abnormal findings: Secondary | ICD-10-CM

## 2023-04-04 DIAGNOSIS — R1032 Left lower quadrant pain: Secondary | ICD-10-CM

## 2023-04-04 DIAGNOSIS — R002 Palpitations: Secondary | ICD-10-CM | POA: Insufficient documentation

## 2023-04-04 DIAGNOSIS — Z833 Family history of diabetes mellitus: Secondary | ICD-10-CM

## 2023-04-04 DIAGNOSIS — R1031 Right lower quadrant pain: Secondary | ICD-10-CM

## 2023-04-04 DIAGNOSIS — R739 Hyperglycemia, unspecified: Secondary | ICD-10-CM

## 2023-04-04 DIAGNOSIS — Z0000 Encounter for general adult medical examination without abnormal findings: Secondary | ICD-10-CM

## 2023-04-04 LAB — LIPID PANEL
Cholesterol: 188 mg/dL (ref 0–200)
HDL: 71.7 mg/dL (ref >39.00)
LDL Cholesterol: 102 mg/dL — ABNORMAL HIGH (ref 0–99)
NonHDL: 116.77
Total CHOL/HDL Ratio: 3
Triglycerides: 75 mg/dL (ref 0.0–149.0)
VLDL: 15 mg/dL (ref 0.0–40.0)

## 2023-04-04 LAB — CBC WITH DIFFERENTIAL/PLATELET
Basophils Absolute: 0 10*3/uL (ref 0.0–0.1)
Basophils Relative: 0.3 % (ref 0.0–3.0)
Eosinophils Absolute: 0.1 10*3/uL (ref 0.0–0.7)
Eosinophils Relative: 0.7 % (ref 0.0–5.0)
HCT: 37.1 % (ref 36.0–46.0)
Hemoglobin: 12.6 g/dL (ref 12.0–15.0)
Lymphocytes Relative: 22.4 % (ref 12.0–46.0)
Lymphs Abs: 2.2 10*3/uL (ref 0.7–4.0)
MCHC: 34 g/dL (ref 30.0–36.0)
MCV: 81.7 fl (ref 78.0–100.0)
Monocytes Absolute: 0.8 10*3/uL (ref 0.1–1.0)
Monocytes Relative: 8 % (ref 3.0–12.0)
Neutro Abs: 6.7 10*3/uL (ref 1.4–7.7)
Neutrophils Relative %: 68.6 % (ref 43.0–77.0)
Platelets: 234 10*3/uL (ref 150.0–400.0)
RBC: 4.54 Mil/uL (ref 3.87–5.11)
RDW: 13.5 % (ref 11.5–15.5)
WBC: 9.7 10*3/uL (ref 4.0–10.5)

## 2023-04-04 LAB — COMPREHENSIVE METABOLIC PANEL
ALT: 10 U/L (ref 0–35)
AST: 17 U/L (ref 0–37)
Albumin: 4.3 g/dL (ref 3.5–5.2)
Alkaline Phosphatase: 35 U/L — ABNORMAL LOW (ref 39–117)
BUN: 10 mg/dL (ref 6–23)
CO2: 28 mEq/L (ref 19–32)
Calcium: 9.7 mg/dL (ref 8.4–10.5)
Chloride: 101 mEq/L (ref 96–112)
Creatinine, Ser: 0.94 mg/dL (ref 0.40–1.20)
GFR: 71.23 mL/min (ref >60.00)
Glucose, Bld: 101 mg/dL — ABNORMAL HIGH (ref 70–99)
Potassium: 4.4 mEq/L (ref 3.5–5.1)
Sodium: 138 mEq/L (ref 135–145)
Total Bilirubin: 0.4 mg/dL (ref 0.2–1.2)
Total Protein: 7.3 g/dL (ref 6.0–8.3)

## 2023-04-04 LAB — TSH: TSH: 1.04 u[IU]/mL (ref 0.35–5.50)

## 2023-04-04 LAB — HEMOGLOBIN A1C: Hgb A1c MFr Bld: 5.8 % (ref 4.6–6.5)

## 2023-04-04 NOTE — Progress Notes (Signed)
Subjective:    Patient ID: Olivia Frederick, female    DOB: June 28, 1971, 52 y.o.   MRN: [id number]      HPI Olivia Frederick is here for a Physical exam and her chronic medical problems.   Overall doing well.  Increased pressure in lower abd - ? When about to ovulate.  Has fribroids-wondered if it was related to that.  The pressure and bloating do go away but then returned about the time she can ovulate again Up-to-date with GYN-sees them in August.  Has had ultrasounds in the past.  Having hot flashes  Hiking - flutter in chest and neck when more strenuous activity.  This is something she noticed more recently.  Does not notice it outside of strenuous activity.   Medications and allergies reviewed with patient and updated if appropriate.  Current Outpatient Medications on File Prior to Visit  Medication Sig Dispense Refill   tamoxifen (NOLVADEX) 20 MG tablet Take 1 tablet (20 mg total) by mouth daily. 90 tablet 3   [DISCONTINUED] prochlorperazine (COMPAZINE) 10 MG tablet Take 1 tablet (10 mg total) by mouth every 6 (six) hours as needed (Nausea or vomiting). 30 tablet 1   No current facility-administered medications on file prior to visit.    Review of Systems  Constitutional:  Negative for fever.  Eyes:  Negative for visual disturbance.  Respiratory:  Negative for cough, shortness of breath and wheezing.   Cardiovascular:  Positive for palpitations (with strenuous activity). Negative for chest pain and leg swelling.  Gastrointestinal:  Positive for abdominal distention (pressure and bloating at time of ovulation). Negative for abdominal pain, blood in stool, constipation and diarrhea.       No gerd  Genitourinary:  Negative for dysuria.  Musculoskeletal:  Negative for arthralgias and back pain.  Skin:  Negative for rash.  Neurological:  Negative for light-headedness and headaches.  Psychiatric/Behavioral:  Negative for dysphoric mood. The patient is not nervous/anxious.         Objective:   Vitals:   04/04/23 1417  BP: 118/74  Pulse: 80  Temp: 98.2 F (36.8 C)  SpO2: 98%   Filed Weights   04/04/23 1417  Weight: 150 lb (68 kg)   Body mass index is 24.21 kg/m.  BP Readings from Last 3 Encounters:  04/04/23 118/74  07/26/22 123/67  03/30/22 122/78    Wt Readings from Last 3 Encounters:  04/04/23 150 lb (68 kg)  07/26/22 150 lb 4.8 oz (68.2 kg)  03/30/22 143 lb (64.9 kg)       Physical Exam Constitutional: She appears well-developed and well-nourished. No distress.  HENT:  Head: Normocephalic and atraumatic.  Right Ear: External ear normal. Normal ear canal and TM Left Ear: External ear normal.  Normal ear canal and TM Mouth/Throat: Oropharynx is clear and moist.  Eyes: Conjunctivae normal.  Neck: Neck supple. No tracheal deviation present. No thyromegaly present.  No carotid bruit  Cardiovascular: Normal rate, regular rhythm and normal heart sounds.   No murmur heard.  No edema. Pulmonary/Chest: Effort normal and breath sounds normal. No respiratory distress. She has no wheezes. She has no rales.  Breast: deferred   Abdominal: Soft. She exhibits no distension. There is tenderness in her lower abdomen and she states she is from the time of ovulation and feels is related to that..  Lymphadenopathy: She has no cervical adenopathy.  Skin: Skin is warm and dry. She is not diaphoretic.  Psychiatric: She has a normal mood and affect.  Her behavior is normal.     Lab Results  Component Value Date   WBC 7.8 07/26/2022   HGB 12.7 07/26/2022   HCT 35.0 (L) 07/26/2022   PLT 258 07/26/2022   GLUCOSE 154 (H) 07/26/2022   CHOL 192 03/30/2022   TRIG 43.0 03/30/2022   HDL 65.00 03/30/2022   LDLCALC 118 (H) 03/30/2022   ALT 10 07/26/2022   AST 15 07/26/2022   NA 140 07/26/2022   K 3.6 07/26/2022   CL 105 07/26/2022   CREATININE 0.72 07/26/2022   BUN 6 07/26/2022   CO2 29 07/26/2022   TSH 1.20 03/30/2022   HGBA1C 5.6 03/30/2022          Assessment & Plan:   Physical exam: Screening blood work  ordered Exercise  walking, hiking Weight  normal Substance abuse  none   Reviewed recommended immunizations.  Tdap today   Health Maintenance  Topic Date Due   PAP SMEAR-Modifier  11/14/2019   COVID-19 Vaccine (3 - Pfizer risk series) 04/20/2023 (Originally 08/17/2020)   HIV Screening  03/26/2059 (Originally 09/17/1988)   Hepatitis C Screening  03/29/2064 (Originally 09/18/1991)   INFLUENZA VACCINE  06/08/2023   Colonoscopy  10/19/2025   DTaP/Tdap/Td (2 - Td or Tdap) 04/03/2033   HPV VACCINES  Aged Out          See Problem List for Assessment and Plan of chronic medical problems.

## 2023-04-04 NOTE — Assessment & Plan Note (Signed)
New Having discomfort and bloating across her lower abdomen-typically occurs at the time of ovulation, resolves and then returns around the time of ovulation again Has fibroids and likely related to that Due for next GYN visit in August-she will call them now to see if she can get that scheduled so they can evaluate-if they are not able to get her in at that time she will let me know and I will order the ultrasound

## 2023-04-04 NOTE — Assessment & Plan Note (Signed)
Chronic Check a1c, CBC, CMP, lipid panel Low sugar / carb diet Stressed regular exercise 

## 2023-04-04 NOTE — Assessment & Plan Note (Signed)
Chronic Following with oncology On tamoxifen 20 mg daily

## 2023-04-04 NOTE — Assessment & Plan Note (Signed)
New Noticed recently she has had some flutters or palpitations in her chest and neck with strenuous exercise-hiking up steep hill, but no other time She will monitor symptoms only occurring with strenuous exercise If palpitations persist or worsen she will let me know we can order a Holter monitor Basic blood work today including CBC, CMP and TSH

## 2023-04-05 ENCOUNTER — Encounter: Payer: Self-pay | Admitting: Internal Medicine

## 2023-05-19 ENCOUNTER — Ambulatory Visit (INDEPENDENT_AMBULATORY_CARE_PROVIDER_SITE_OTHER): Payer: 59 | Admitting: Family Medicine

## 2023-05-19 ENCOUNTER — Encounter: Payer: Self-pay | Admitting: Family Medicine

## 2023-05-19 VITALS — BP 108/68 | HR 70 | Temp 97.6°F | Ht 66.0 in | Wt 148.0 lb

## 2023-05-19 DIAGNOSIS — L298 Other pruritus: Secondary | ICD-10-CM

## 2023-05-19 DIAGNOSIS — L509 Urticaria, unspecified: Secondary | ICD-10-CM | POA: Diagnosis not present

## 2023-05-19 DIAGNOSIS — L2989 Other pruritus: Secondary | ICD-10-CM

## 2023-05-19 LAB — CBC WITH DIFFERENTIAL/PLATELET
Basophils Absolute: 0 10*3/uL (ref 0.0–0.1)
Basophils Relative: 0.2 % (ref 0.0–3.0)
Eosinophils Absolute: 0.2 10*3/uL (ref 0.0–0.7)
Eosinophils Relative: 2.6 % (ref 0.0–5.0)
HCT: 37.2 % (ref 36.0–46.0)
Hemoglobin: 12.7 g/dL (ref 12.0–15.0)
Lymphocytes Relative: 28.5 % (ref 12.0–46.0)
Lymphs Abs: 1.9 10*3/uL (ref 0.7–4.0)
MCHC: 34.3 g/dL (ref 30.0–36.0)
MCV: 82.2 fl (ref 78.0–100.0)
Monocytes Absolute: 0.8 10*3/uL (ref 0.1–1.0)
Monocytes Relative: 11.8 % (ref 3.0–12.0)
Neutro Abs: 3.8 10*3/uL (ref 1.4–7.7)
Neutrophils Relative %: 56.9 % (ref 43.0–77.0)
Platelets: 254 10*3/uL (ref 150.0–400.0)
RBC: 4.52 Mil/uL (ref 3.87–5.11)
RDW: 13.9 % (ref 11.5–15.5)
WBC: 6.6 10*3/uL (ref 4.0–10.5)

## 2023-05-19 LAB — C-REACTIVE PROTEIN: CRP: <1 mg/dL (ref 0.5–20.0)

## 2023-05-19 MED ORDER — MONTELUKAST SODIUM 10 MG PO TABS
10.0000 mg | ORAL_TABLET | Freq: Every day | ORAL | 1 refills | Status: DC
Start: 2023-05-19 — End: 2023-07-31

## 2023-05-19 MED ORDER — TRIAMCINOLONE ACETONIDE 0.025 % EX OINT
1.0000 | TOPICAL_OINTMENT | Freq: Two times a day (BID) | CUTANEOUS | 0 refills | Status: DC
Start: 2023-05-19 — End: 2023-07-31

## 2023-05-19 MED ORDER — DOXEPIN HCL 10 MG PO CAPS
10.0000 mg | ORAL_CAPSULE | Freq: Every evening | ORAL | 0 refills | Status: DC | PRN
Start: 2023-05-19 — End: 2023-07-31

## 2023-05-19 NOTE — Progress Notes (Signed)
Subjective:     Patient ID: Olivia Frederick, female    DOB: 1971-05-21, 52 y.o.   MRN: [id number]  Chief Complaint  Patient presents with   Rash    Rash on leg, started on chest in May when she saw Dr. Lawerance Bach. Moved from chest to foot, then back of legs, upper legs, now butt. Has tried hydrocortisone cream and calamine lotion.     Rash Pertinent negatives include no cough, diarrhea, fever, joint pain, shortness of breath, sore throat or vomiting.    Discussed the use of AI scribe software for clinical note transcription with the patient, who gave verbal consent to proceed.  History of Present Illness          Complains of pleuritic rash that was initially on her chest in May.  That rash resolved and now she has a pruritic raised rash on her ankles, upper legs, arms and neck.  She has been seen small bugs and does sit outside often.  She has been taking Benadryl and using calamine lotion without relief.  History of breast cancer and on tamoxifen.    Health Maintenance Due  Topic Date Due   PAP SMEAR-Modifier  11/14/2019    Past Medical History:  Diagnosis Date   Genetic testing 04/24/2017   [name] underwent genetic counseling and testing for hereditary cancer syndromes on 03/08/2017. 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   The remaining 45 genes analyzed by [facility]'s 46-gene Common Hereditary Cancers Panel were negative for mutations. Genes analyzed include: APC, ATM, AXIN2, BARD1, BMPR1A, BRCA1, BRCA2, BRIP1, CDH1, CDKN2   History of breast cancer 2018   right   History of chemotherapy    finished chemo 05/2017    Past Surgical History:  Procedure Laterality Date   BREAST RECONSTRUCTION WITH PLACEMENT OF TISSUE EXPANDER AND FLEX HD (ACELLULAR HYDRATED DERMIS) Bilateral 06/27/2017   Procedure: BILATERAL BREAST RECONSTRUCTION WITH PLACEMENT OF TISSUE EXPANDER AND ALLODERM;  Surgeon: Glenna Fellows, MD;   Location: [facility];  Service: Plastics;  Laterality: Bilateral;   CESAREAN SECTION     x 2   NIPPLE SPARING MASTECTOMY/SENTINAL LYMPH NODE BIOPSY/RECONSTRUCTION/PLACEMENT OF TISSUE EXPANDER Bilateral 06/27/2017   Procedure: BILATERAL NIPPLE SPARING MASTECTOMY WITH RIGHT SENTINAL LYMPH NODE BIOPSY;  Surgeon: Glenna Fellows, MD;  Location: [facility];  Service: General;  Laterality: Bilateral;   REMOVAL OF BILATERAL TISSUE EXPANDERS WITH PLACEMENT OF BILATERAL BREAST IMPLANTS Bilateral 10/02/2017   Procedure: REMOVAL OF BILATERAL TISSUE EXPANDERS WITH PLACEMENT OF BILATERAL BREAST IMPLANTS;  Surgeon: Glenna Fellows, MD;  Location:  SURGERY CENTER;  Service: Plastics;  Laterality: Bilateral;    Family History  Problem Relation Age of Onset   Hypertension Mother    Breast cancer Mother        diagnosed after her   Thyroid disease Mother    Other Mother        carotid artery dis   Diabetes Father    Leukemia Paternal Grandfather        d.64   Breast cancer Cousin 40       Bilateral breast cancer. Daughter of paternal uncle with leukemia.   Leukemia Paternal Uncle 33   Lung cancer Maternal Uncle 48   Diabetes Brother    Brain cancer Maternal Aunt 27       d.66   Cancer Maternal Uncle        unspecified type   Brain cancer Cousin 97  d.55 daughter of maternal aunt with brain cancer    Social History   Socioeconomic History   Marital status: Married    Spouse name: Not on file   Number of children: 2   Years of education: Not on file   Highest education level: Not on file  Occupational History   Not on file  Tobacco Use   Smoking status: Former    Current packs/day: 0.00    Types: Cigarettes    Quit date: 02/01/2017    Years since quitting: 6.2   Smokeless tobacco: Never   Tobacco comments:    social smoking for 20 years  Vaping Use   Vaping status: Never Used  Substance and Sexual Activity   Alcohol use: Yes     Comment: 3-4 drinks/week   Drug use: No   Sexual activity: Not on file  Other Topics Concern   Not on file  Social History Narrative   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 90 tablet 3   No facility-administered medications prior to visit.    No Known Allergies  Review of Systems  Constitutional:  Negative for chills, diaphoresis, fever and weight loss.  HENT:  Negative for sore throat.   Respiratory:  Negative for cough, shortness of breath and wheezing.   Cardiovascular:  Negative for chest pain, palpitations and leg swelling.  Gastrointestinal:  Negative for abdominal pain, constipation, diarrhea, nausea and vomiting.  Genitourinary:  Negative for dysuria, frequency and urgency.  Musculoskeletal:  Negative for joint pain and myalgias.  Skin:  Positive for itching and rash.  Neurological:  Negative for dizziness, tingling, sensory change, focal weakness and headaches.       Objective:    Physical Exam Constitutional:      General: She is not in acute distress.    Appearance: She is not ill-appearing.  HENT:     Nose: Nose normal.     Mouth/Throat:     Mouth: Mucous membranes are moist.  Eyes:     Extraocular Movements: Extraocular movements intact.     Conjunctiva/sclera: Conjunctivae normal.  Cardiovascular:     Rate and Rhythm: Normal rate.  Pulmonary:     Effort: Pulmonary effort is normal.  Musculoskeletal:     Cervical back: Normal range of motion and neck supple.  No tenderness.  Lymphadenopathy:     Cervical: No cervical adenopathy.  Skin:    General: Skin is warm and dry.     Findings: Rash present.     Comments: Diffuse erythematous raised papules and localized urticaria type reactions on right neck, forearms, wrists, thighs and ankles. No blisters, drainage or sign of secondary bacterial infection   Neurological:     General: No focal deficit present.     Mental Status: She is alert and oriented to person, place, and time.     Cranial Nerves: No cranial nerve deficit.     Sensory: No sensory deficit.  Motor: No weakness.     Coordination: Coordination normal.  Psychiatric:        Mood and Affect: Mood normal.        Behavior: Behavior normal.        Thought Content: Thought content normal.      BP 108/68 (BP Location: Left Arm, Patient Position: Sitting, Cuff Size: Normal)   Pulse 70   Temp 97.6 F (36.4 C) (Temporal)   Ht 5\' 6"  (1.676 m)   Wt 148 lb (67.1 kg)   SpO2 98%   BMI 23.89 kg/m  Wt Readings from Last 3 Encounters:  05/19/23 148 lb (67.1 kg)  04/04/23 150 lb (68 kg)  07/26/22 150 lb 4.8 oz (68.2 kg)       Assessment & Plan:   Problem List Items Addressed This Visit   None Visit Diagnoses     Pruritic erythematous rash    -  Primary   Relevant Medications   doxepin (SINEQUAN) 10 MG capsule   montelukast (SINGULAIR) 10 MG tablet   triamcinolone (KENALOG) 0.025 % ointment   Other Relevant Orders   CBC with Differential/Platelet   C-reactive protein   Urticaria of unknown origin          Pruritic rash with urticaria ongoing for approximately 2 months.  Check CBC and CRP.  No sign of secondary bacterial infection.  She will stop Benadryl and start doxepin.  She will let me know if this is too sedating and we can switch to hydroxyzine.  Start Singulair nightly.  May use topical triamcinolone short-term.  Discussed cool showers and compresses for itching. Dr. Yetta Barre also examined patient She will also start  being more aware of insects and using deterrent.  I am having Olivia Frederick start on doxepin, montelukast, and triamcinolone. I am also having her maintain her tamoxifen.  Meds ordered this encounter  Medications   doxepin (SINEQUAN) 10 MG capsule    Sig: Take 1 capsule (10 mg total) by mouth at bedtime as needed.    Dispense:  30 capsule    Refill:  0    Order Specific Question:   Supervising Provider    Answer:   Hillard Danker A [[id number]]   montelukast (SINGULAIR) 10 MG tablet    Sig: Take 1 tablet (10 mg total) by mouth at bedtime.    Dispense:  30 tablet    Refill:  1    Order Specific Question:   Supervising Provider    Answer:   Hillard Danker A [[id number]]   triamcinolone (KENALOG) 0.025 % ointment    Sig: Apply 1 Application topically 2 (two) times daily.    Dispense:  30 g    Refill:  0    Order Specific Question:   Supervising Provider    Answer:   Hillard Danker A [[id number]]

## 2023-05-19 NOTE — Progress Notes (Signed)
Labs are normal. Good news. No change needed to the treatment plan we discussed.

## 2023-05-19 NOTE — Patient Instructions (Addendum)
Please go downstairs for labs.   Your rash may be due to a viral condition and may take several weeks to completely resolve.   Stop Benadryl and try Doxepin at bedtime for itching.   Start Singulair as prescribed daily at bedtime.   Use cool showers, bathes, compresses for itching. Hot water makes itching worse.   Use bug deterrent/spray/clothing to prevent bug bites in case this is contributory

## 2023-07-31 ENCOUNTER — Inpatient Hospital Stay: Payer: 59 | Attending: Hematology and Oncology | Admitting: Hematology and Oncology

## 2023-07-31 VITALS — BP 128/60 | HR 63 | Temp 98.1°F | Resp 16 | Wt 149.1 lb

## 2023-07-31 DIAGNOSIS — C50411 Malignant neoplasm of upper-outer quadrant of right female breast: Secondary | ICD-10-CM | POA: Insufficient documentation

## 2023-07-31 DIAGNOSIS — Z9013 Acquired absence of bilateral breasts and nipples: Secondary | ICD-10-CM | POA: Diagnosis not present

## 2023-07-31 DIAGNOSIS — Z79810 Long term (current) use of selective estrogen receptor modulators (SERMs): Secondary | ICD-10-CM | POA: Insufficient documentation

## 2023-07-31 DIAGNOSIS — Z9221 Personal history of antineoplastic chemotherapy: Secondary | ICD-10-CM | POA: Diagnosis not present

## 2023-07-31 DIAGNOSIS — Z170 Estrogen receptor positive status [ER+]: Secondary | ICD-10-CM | POA: Diagnosis not present

## 2023-07-31 MED ORDER — TAMOXIFEN CITRATE 20 MG PO TABS: 20.0000 mg | ORAL_TABLET | Freq: Every day | ORAL | 3 refills | Status: DC

## 2023-07-31 NOTE — Assessment & Plan Note (Addendum)
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  Denies any hot flashes.   Breast cancer surveillance: 1.  Breast exam 07/31/2023: Benign 2. no role of mammograms since she had bilateral mastectomies 3.  Signatera testing for minimal residual disease   She is planning to go to [place] for her 52th birthday. Return to clinic in 1 year for follow-up

## 2023-07-31 NOTE — Progress Notes (Signed)
Patient Care Team: Pincus Sanes, MD as PCP - General (Internal Medicine) Glenna Fellows, MD (Inactive) as Consulting Physician (General Surgery) Magrinat, Valentino Hue, MD (Inactive) as Consulting Physician (Oncology) Antony Blackbird, MD as Consulting Physician (Radiation Oncology) Charna Elizabeth, MD as Consulting Physician (Gastroenterology) Axel Filler Larna Daughters, NP as Nurse Practitioner (Hematology and Oncology)  DIAGNOSIS:  Encounter Diagnosis  Name Primary?   Malignant neoplasm of upper-outer quadrant of right breast in female, estrogen receptor positive (HCC) Yes    SUMMARY OF ONCOLOGIC HISTORY: Oncology History  Malignant neoplasm of upper-outer quadrant of right breast in female, estrogen receptor positive (HCC)  01/31/2017 Initial Biopsy   Right bresat biopsy: IDC, grade 2, ER+(100%), PR+(25%), Ki-67 25%m HER-2+(ratio 2.35). 1 axillary lymph node was negative for malignancy.    02/02/2017 Initial Diagnosis   Malignant neoplasm of upper-outer quadrant of right breast in female, estrogen receptor positive (HCC)   02/16/2017 - 06/01/2017 Neo-Adjuvant Chemotherapy   Docetaxel, Carboplatin, Trastuzumab, Pertuzumab x 6  Pertuzumab omitted with cycle 5 and 6, Docetaxel omitted with cycle 6.   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   The remaining 45 genes analyzed by [facility]'s 46-gene Common Hereditary Cancers Panel were negative for mutations. Genes analyzed include: APC, ATM, AXIN2, BARD1, BMPR1A, BRCA1, BRCA2, BRIP1, CDH1, CDKN2A, CHEK2, CTNNA1, DICER1, EPCAM, GREM1, HOXB13, KIT, MEN1, MLH1, MSH2, MSH3, MSH6, MUTYH, NBN, NF1, NTHL1, PALB2, PDGFRA, PMS2, POLD1, POLE, PTEN, RAD50, RAD51C, RAD51D, SDHA, SDHB, SDHC, SDHD, SMAD4, SMARCA4, STK11, TP53, TSC1,  TSC2, and VHL. The result report is dated for 03/28/2017.   06/22/2017 -  Adjuvant Chemotherapy   Maintenance Trastuzumab to complete one year   06/27/2017 Surgery   Bilateral nipple sparing Mastectomies with right SLNB: left benign, Right: . 3 foci of residual IDC, 3.2cm, 1.6cm, and 0.7 cm, DCIS, LVI, margins negative, 7 LN negative. ER+(95%), PR+(10%), HER-2+(ratio 2.03).    07/2017 -  Anti-estrogen oral therapy   Tamoxifen daily     CHIEF COMPLIANT: Follow-up on tamoxifen  Discussed the use of AI scribe software for clinical note transcription with the patient, who gave verbal consent to proceed.  History of Present Illness   The patient, with a history of breast cancer, presents for a routine follow-up. She has been on Tamoxifen for nearly seven years without any new or different side effects. She reports experiencing hot flashes, which she attributes to natural aging and perimenopause, as discussed with her primary care physician. She is committed to completing the full ten-year course of Tamoxifen to derive maximum benefit.  A few months ago, she had an allergic reaction and was on medication for it, but she has since stopped taking that medication. She is otherwise healthy and does not take any other medications.         ALLERGIES:  has No Known Allergies.  MEDICATIONS:  Current Outpatient Medications  Medication Sig Dispense Refill   tamoxifen (NOLVADEX) 20 MG tablet Take 1 tablet (20 mg total) by mouth daily. 90 tablet 3   No current facility-administered medications for this visit.    PHYSICAL EXAMINATION: ECOG PERFORMANCE STATUS: 1 - Symptomatic but completely ambulatory  Vitals:   07/31/23 1518  BP: 128/60  Pulse: 63  Resp: 16  Temp: 98.1 F (36.7 C)  SpO2: 100%   Filed Weights  07/31/23 1518  Weight: 149 lb 1.6 oz (67.6 kg)      LABORATORY DATA:  I have reviewed the data as listed    Latest Ref Rng & Units 04/04/2023    3:00 PM 07/26/2022    3:01 PM  03/30/2022    1:32 PM  CMP  Glucose 70 - 99 mg/dL 161  096  99   BUN 6 - 23 mg/dL 10  6  11    Creatinine 0.40 - 1.20 mg/dL 0.45  4.09  8.11   Sodium 135 - 145 mEq/L 138  140  138   Potassium 3.5 - 5.1 mEq/L 4.4  3.6  3.9   Chloride 96 - 112 mEq/L 101  105  102   CO2 19 - 32 mEq/L 28  29  28    Calcium 8.4 - 10.5 mg/dL 9.7  9.4  9.8   Total Protein 6.0 - 8.3 g/dL 7.3  7.2  7.7   Total Bilirubin 0.2 - 1.2 mg/dL 0.4  0.5  0.7   Alkaline Phos 39 - 117 U/L 35  32  37   AST 0 - 37 U/L 17  15  15    ALT 0 - 35 U/L 10  10  11      Lab Results  Component Value Date   WBC 6.6 05/19/2023   HGB 12.7 05/19/2023   HCT 37.2 05/19/2023   MCV 82.2 05/19/2023   PLT 254.0 05/19/2023   NEUTROABS 3.8 05/19/2023    ASSESSMENT & PLAN:  Malignant neoplasm of upper-outer quadrant of right breast in female, estrogen receptor positive (HCC) 01/24/2017: 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  Denies any hot flashes.   Breast cancer surveillance: 1.  Breast exam 07/31/2023: Benign 2. no role of mammograms since she had bilateral mastectomies 3.  Signatera testing for minimal residual disease   She is planning to go to [place] for her 52th birthday. Return to clinic in 1 year for follow-up   No orders of the defined types were placed in this encounter.  The patient has a good understanding of the overall plan. she agrees with it. she will call with any problems that may develop before the next visit here. Total time spent: 30 mins including face to face time and time spent for planning, charting and co-ordination of  care   Tamsen Meek, MD 07/31/23

## 2023-08-07 ENCOUNTER — Other Ambulatory Visit: Payer: Self-pay | Admitting: Hematology and Oncology

## 2024-04-04 ENCOUNTER — Encounter: Payer: Self-pay | Admitting: Internal Medicine

## 2024-04-04 NOTE — Progress Notes (Unsigned)
 Subjective:    Patient ID: Olivia Frederick, female    DOB: 27-Dec-1970, 53 y.o.   MRN: [id number]      HPI Olivia Frederick is here for a Physical exam and her chronic medical problems.   Doing well   Toenails still have not recovered since chemo  Having acne and never had that before.    Medications and allergies reviewed with patient and updated if appropriate.  Current Outpatient Medications on File Prior to Visit  Medication Sig Dispense Refill   tamoxifen  (NOLVADEX ) 20 MG tablet TAKE 1 TABLET BY MOUTH DAILY 90 tablet 3   [DISCONTINUED] prochlorperazine  (COMPAZINE ) 10 MG tablet Take 1 tablet (10 mg total) by mouth every 6 (six) hours as needed (Nausea or vomiting). 30 tablet 1   No current facility-administered medications on file prior to visit.    Review of Systems  Constitutional:  Negative for fever.  Eyes:  Negative for visual disturbance.  Respiratory:  Negative for cough, shortness of breath and wheezing.   Cardiovascular:  Negative for chest pain, palpitations and leg swelling.  Gastrointestinal:  Negative for abdominal pain, blood in stool (had black tar stool w a little blood in it in Feb x 2 weeks), constipation and diarrhea.       No gerd  Genitourinary:  Negative for dysuria.  Musculoskeletal:  Negative for arthralgias and back pain.  Skin:  Negative for rash.       Pimples   Neurological:  Negative for light-headedness and headaches.  Psychiatric/Behavioral:  Positive for sleep disturbance (get hot flashes). Negative for dysphoric mood. The patient is not nervous/anxious.        Objective:   Vitals:   04/05/24 1449  BP: 124/78  Pulse: 80  Temp: 98.6 F (37 C)  SpO2: 97%   Filed Weights   04/05/24 1449  Weight: 150 lb (68 kg)   Body mass index is 24.21 kg/m.  BP Readings from Last 3 Encounters:  04/05/24 124/78  07/31/23 128/60  05/19/23 108/68    Wt Readings from Last 3 Encounters:  04/05/24 150 lb (68 kg)  07/31/23 149 lb 1.6 oz  (67.6 kg)  05/19/23 148 lb (67.1 kg)       Physical Exam Constitutional: She appears well-developed and well-nourished. No distress.  HENT:  Head: Normocephalic and atraumatic.  Right Ear: External ear normal. Normal ear canal and TM Left Ear: External ear normal.  Normal ear canal and TM Mouth/Throat: Oropharynx is clear and moist.  Eyes: Conjunctivae normal.  Neck: Neck supple. No tracheal deviation present. No thyromegaly present.  No carotid bruit  Cardiovascular: Normal rate, regular rhythm and normal heart sounds.   No murmur heard.  No edema. Pulmonary/Chest: Effort normal and breath sounds normal. No respiratory distress. She has no wheezes. She has no rales.  Breast: deferred   Abdominal: Soft. She exhibits no distension. There is no tenderness.  Lymphadenopathy: She has no cervical adenopathy.  Skin: Skin is warm and dry. She is not diaphoretic.  Psychiatric: She has a normal mood and affect. Her behavior is normal.     Lab Results  Component Value Date   WBC 6.6 05/19/2023   HGB 12.7 05/19/2023   HCT 37.2 05/19/2023   PLT 254.0 05/19/2023   GLUCOSE 101 (H) 04/04/2023   CHOL 188 04/04/2023   TRIG 75.0 04/04/2023   HDL 71.70 04/04/2023   LDLCALC 102 (H) 04/04/2023   ALT 10 04/04/2023   AST 17 04/04/2023   NA 138 04/04/2023  K 4.4 04/04/2023   CL 101 04/04/2023   CREATININE 0.94 04/04/2023   BUN 10 04/04/2023   CO2 28 04/04/2023   TSH 1.04 04/04/2023   HGBA1C 5.8 04/04/2023         Assessment & Plan:   Physical exam: Screening blood work  ordered Exercise  waling Weight  normal - stable Substance abuse  none   Reviewed recommended immunizations.   Health Maintenance  Topic Date Due   Zoster Vaccines- Shingrix (1 of 2) Never done   Cervical Cancer Screening (HPV/Pap Cotest)  11/13/2017   COVID-19 Vaccine (3 - Pfizer risk series) 04/20/2024 (Originally 08/17/2020)   HIV Screening  03/26/2059 (Originally 09/17/1988)   Hepatitis C Screening   03/29/2064 (Originally 09/18/1991)   INFLUENZA VACCINE  06/07/2024   Colonoscopy  10/19/2025   DTaP/Tdap/Td (2 - Td or Tdap) 04/03/2033   HPV VACCINES  Aged Out   Meningococcal B Vaccine  Aged Out          See Problem List for Assessment and Plan of chronic medical problems.

## 2024-04-04 NOTE — Patient Instructions (Addendum)
 Blood work was ordered.       Medications changes include :   urea  gel and topical acne medication    A referral was ordered Dr Tova Fresh and someone will call you to schedule an appointment.     Return in about 1 year (around 04/05/2025) for Physical Exam.    Health Maintenance, Female Adopting a healthy lifestyle and getting preventive care are important in promoting health and wellness. Ask your health care provider about: The right schedule for you to have regular tests and exams. Things you can do on your own to prevent diseases and keep yourself healthy. What should I know about diet, weight, and exercise? Eat a healthy diet  Eat a diet that includes plenty of vegetables, fruits, low-fat dairy products, and lean protein. Do not eat a lot of foods that are high in solid fats, added sugars, or sodium. Maintain a healthy weight Body mass index (BMI) is used to identify weight problems. It estimates body fat based on height and weight. Your health care provider can help determine your BMI and help you achieve or maintain a healthy weight. Get regular exercise Get regular exercise. This is one of the most important things you can do for your health. Most adults should: Exercise for at least 150 minutes each week. The exercise should increase your heart rate and make you sweat (moderate-intensity exercise). Do strengthening exercises at least twice a week. This is in addition to the moderate-intensity exercise. Spend less time sitting. Even light physical activity can be beneficial. Watch cholesterol and blood lipids Have your blood tested for lipids and cholesterol at 51 years of age, then have this test every 5 years. Have your cholesterol levels checked more often if: Your lipid or cholesterol levels are high. You are older than 51 years of age. You are at high risk for heart disease. What should I know about cancer screening? Depending on your health history and family  history, you may need to have cancer screening at various ages. This may include screening for: Breast cancer. Cervical cancer. Colorectal cancer. Skin cancer. Lung cancer. What should I know about heart disease, diabetes, and high blood pressure? Blood pressure and heart disease High blood pressure causes heart disease and increases the risk of stroke. This is more likely to develop in people who have high blood pressure readings or are overweight. Have your blood pressure checked: Every 3-5 years if you are 15-61 years of age. Every year if you are 38 years old or older. Diabetes Have regular diabetes screenings. This checks your fasting blood sugar level. Have the screening done: Once every three years after age 70 if you are at a normal weight and have a low risk for diabetes. More often and at a younger age if you are overweight or have a high risk for diabetes. What should I know about preventing infection? Hepatitis B If you have a higher risk for hepatitis B, you should be screened for this virus. Talk with your health care provider to find out if you are at risk for hepatitis B infection. Hepatitis C Testing is recommended for: Everyone born from [birth year] through 1963. Anyone with known risk factors for hepatitis C. Sexually transmitted infections (STIs) Get screened for STIs, including gonorrhea and chlamydia, if: You are sexually active and are younger than 51 years of age. You are older than 51 years of age and your health care provider tells you that you are at risk for  this type of infection. Your sexual activity has changed since you were last screened, and you are at increased risk for chlamydia or gonorrhea. Ask your health care provider if you are at risk. Ask your health care provider about whether you are at high risk for HIV. Your health care provider may recommend a prescription medicine to help prevent HIV infection. If you choose to take medicine to prevent HIV, you  should first get tested for HIV. You should then be tested every 3 months for as long as you are taking the medicine. Pregnancy If you are about to stop having your period (premenopausal) and you may become pregnant, seek counseling before you get pregnant. Take 400 to 800 micrograms (mcg) of folic acid every day if you become pregnant. Ask for birth control (contraception) if you want to prevent pregnancy. Osteoporosis and menopause Osteoporosis is a disease in which the bones lose minerals and strength with aging. This can result in bone fractures. If you are 27 years old or older, or if you are at risk for osteoporosis and fractures, ask your health care provider if you should: Be screened for bone loss. Take a calcium or vitamin D supplement to lower your risk of fractures. Be given hormone replacement therapy (HRT) to treat symptoms of menopause. Follow these instructions at home: Alcohol use Do not drink alcohol if: Your health care provider tells you not to drink. You are pregnant, may be pregnant, or are planning to become pregnant. If you drink alcohol: Limit how much you have to: 0-1 drink a day. Know how much alcohol is in your drink. In the [place] one drink equals one 12 oz bottle of beer (355 mL), one 5 oz glass of wine (148 mL), or one 1 oz glass of hard liquor (44 mL). Lifestyle Do not use any products that contain nicotine or tobacco. These products include cigarettes, chewing tobacco, and vaping devices, such as e-cigarettes. If you need help quitting, ask your health care provider. Do not use street drugs. Do not share needles. Ask your health care provider for help if you need support or information about quitting drugs. General instructions Schedule regular health, dental, and eye exams. Stay current with your vaccines. Tell your health care provider if: You often feel depressed. You have ever been abused or do not feel safe at home. Summary Adopting a healthy  lifestyle and getting preventive care are important in promoting health and wellness. Follow your health care provider's instructions about healthy diet, exercising, and getting tested or screened for diseases. Follow your health care provider's instructions on monitoring your cholesterol and blood pressure. This information is not intended to replace advice given to you by your health care provider. Make sure you discuss any questions you have with your health care provider. Document Revised: 03/15/2021 Document Reviewed: 03/15/2021 Elsevier Patient Education  2024 [employer].

## 2024-04-05 ENCOUNTER — Ambulatory Visit: Payer: Self-pay | Admitting: Internal Medicine

## 2024-04-05 ENCOUNTER — Ambulatory Visit: Admitting: Internal Medicine

## 2024-04-05 VITALS — BP 124/78 | HR 80 | Temp 98.6°F | Ht 66.0 in | Wt 150.0 lb

## 2024-04-05 DIAGNOSIS — L603 Nail dystrophy: Secondary | ICD-10-CM

## 2024-04-05 DIAGNOSIS — L709 Acne, unspecified: Secondary | ICD-10-CM | POA: Insufficient documentation

## 2024-04-05 DIAGNOSIS — C50411 Malignant neoplasm of upper-outer quadrant of right female breast: Secondary | ICD-10-CM | POA: Diagnosis not present

## 2024-04-05 DIAGNOSIS — R7303 Prediabetes: Secondary | ICD-10-CM

## 2024-04-05 DIAGNOSIS — K921 Melena: Secondary | ICD-10-CM

## 2024-04-05 DIAGNOSIS — Z0000 Encounter for general adult medical examination without abnormal findings: Secondary | ICD-10-CM | POA: Diagnosis not present

## 2024-04-05 DIAGNOSIS — L700 Acne vulgaris: Secondary | ICD-10-CM

## 2024-04-05 LAB — COMPREHENSIVE METABOLIC PANEL WITH GFR
ALT: 13 U/L (ref 0–35)
AST: 18 U/L (ref 0–37)
Albumin: 4.4 g/dL (ref 3.5–5.2)
Alkaline Phosphatase: 33 U/L — ABNORMAL LOW (ref 39–117)
BUN: 12 mg/dL (ref 6–23)
CO2: 27 meq/L (ref 19–32)
Calcium: 9.4 mg/dL (ref 8.4–10.5)
Chloride: 104 meq/L (ref 96–112)
Creatinine, Ser: 0.83 mg/dL (ref 0.40–1.20)
GFR: 82.13 mL/min (ref >60.00)
Glucose, Bld: 99 mg/dL (ref 70–99)
Potassium: 4 meq/L (ref 3.5–5.1)
Sodium: 139 meq/L (ref 135–145)
Total Bilirubin: 0.4 mg/dL (ref 0.2–1.2)
Total Protein: 7.3 g/dL (ref 6.0–8.3)

## 2024-04-05 LAB — CBC WITH DIFFERENTIAL/PLATELET
Basophils Absolute: 0.1 10*3/uL (ref 0.0–0.1)
Basophils Relative: 0.7 % (ref 0.0–3.0)
Eosinophils Absolute: 0.2 10*3/uL (ref 0.0–0.7)
Eosinophils Relative: 2.1 % (ref 0.0–5.0)
HCT: 37.9 % (ref 36.0–46.0)
Hemoglobin: 13.1 g/dL (ref 12.0–15.0)
Lymphocytes Relative: 29.7 % (ref 12.0–46.0)
Lymphs Abs: 2.6 10*3/uL (ref 0.7–4.0)
MCHC: 34.5 g/dL (ref 30.0–36.0)
MCV: 81 fl (ref 78.0–100.0)
Monocytes Absolute: 0.7 10*3/uL (ref 0.1–1.0)
Monocytes Relative: 8.4 % (ref 3.0–12.0)
Neutro Abs: 5.2 10*3/uL (ref 1.4–7.7)
Neutrophils Relative %: 59.1 % (ref 43.0–77.0)
Platelets: 275 10*3/uL (ref 150.0–400.0)
RBC: 4.68 Mil/uL (ref 3.87–5.11)
RDW: 13.4 % (ref 11.5–15.5)
WBC: 8.9 10*3/uL (ref 4.0–10.5)

## 2024-04-05 LAB — LIPID PANEL
Cholesterol: 192 mg/dL (ref 0–200)
HDL: 75.5 mg/dL (ref >39.00)
LDL Cholesterol: 106 mg/dL — ABNORMAL HIGH (ref 0–99)
NonHDL: 116.63
Total CHOL/HDL Ratio: 3
Triglycerides: 55 mg/dL (ref 0.0–149.0)
VLDL: 11 mg/dL (ref 0.0–40.0)

## 2024-04-05 LAB — TSH: TSH: 0.71 u[IU]/mL (ref 0.35–5.50)

## 2024-04-05 LAB — HEMOGLOBIN A1C: Hgb A1c MFr Bld: 5.8 % (ref 4.6–6.5)

## 2024-04-05 MED ORDER — UREA NAIL 45 % EX GEL: 1.0000 g | Freq: Every day | CUTANEOUS | 5 refills | Status: AC

## 2024-04-05 MED ORDER — CLINDAMYCIN PHOS-BENZOYL PEROX 1.2-3.75 % EX GEL: CUTANEOUS | 3 refills | Status: DC

## 2024-04-05 NOTE — Assessment & Plan Note (Signed)
 Chronic Thickened toenails Stared after chemo Saw podiatry and was prescribed urea  gel which helped - allowed her to trim the nail Not growing out-retry urea  gel

## 2024-04-05 NOTE — Assessment & Plan Note (Signed)
 Chronic Lab Results  Component Value Date   HGBA1C 5.8 04/04/2023   Check a1c, CBC, CMP, lipid panel, tsh Low sugar / carb diet Stressed regular exercise

## 2024-04-05 NOTE — Assessment & Plan Note (Signed)
Chronic Following with oncology On tamoxifen 20 mg daily

## 2024-04-05 NOTE — Assessment & Plan Note (Signed)
 New Related to perimenopause Start clindamycin phos - benzoyl gel bid prn

## 2024-04-12 ENCOUNTER — Encounter: Payer: Self-pay | Admitting: Oncology

## 2024-04-12 ENCOUNTER — Other Ambulatory Visit (HOSPITAL_COMMUNITY): Payer: Self-pay

## 2024-04-12 ENCOUNTER — Telehealth: Payer: Self-pay

## 2024-04-12 NOTE — Telephone Encounter (Signed)
 Pharmacy Patient Advocate Encounter   Received notification from CoverMyMeds that prior authorization for Onexton 1.2-3.75% gel  is required/requested.   Insurance verification completed.   The patient is insured through [employer] .   Per test claim:  ONEXTON 1.2-3.75%GEL(BRAND NAME) is preferred by the insurance. COPAY OF $10.00 MAXIMUM DAY SUPPLY OF 31 DAYS PA IS NOT NEEDED AT THIS TIME PLEASE BE ADVISED I CALLED PHARMACY TO RESUBMIT WITH DAW 9 PER PLAN REQUIRED FOR BRAND AND THEY GOT A PAID CLAIM AND SHE WILL ORDER FOR MONDAY AND WILL NOTIFY PT WHEN IT IS READY.

## 2024-07-30 ENCOUNTER — Inpatient Hospital Stay: Payer: 59 | Attending: Hematology and Oncology | Admitting: Hematology and Oncology

## 2024-07-30 VITALS — BP 100/80 | HR 63 | Temp 98.4°F | Resp 18 | Ht 66.0 in | Wt 149.1 lb

## 2024-07-30 DIAGNOSIS — Z170 Estrogen receptor positive status [ER+]: Secondary | ICD-10-CM | POA: Diagnosis not present

## 2024-07-30 DIAGNOSIS — Z79810 Long term (current) use of selective estrogen receptor modulators (SERMs): Secondary | ICD-10-CM | POA: Insufficient documentation

## 2024-07-30 DIAGNOSIS — Z1721 Progesterone receptor positive status: Secondary | ICD-10-CM | POA: Insufficient documentation

## 2024-07-30 DIAGNOSIS — C50411 Malignant neoplasm of upper-outer quadrant of right female breast: Secondary | ICD-10-CM | POA: Diagnosis present

## 2024-07-30 DIAGNOSIS — Z9013 Acquired absence of bilateral breasts and nipples: Secondary | ICD-10-CM | POA: Diagnosis not present

## 2024-07-30 DIAGNOSIS — Z9221 Personal history of antineoplastic chemotherapy: Secondary | ICD-10-CM | POA: Insufficient documentation

## 2024-07-30 DIAGNOSIS — Z1731 Human epidermal growth factor receptor 2 positive status: Secondary | ICD-10-CM | POA: Insufficient documentation

## 2024-07-30 MED ORDER — TAMOXIFEN CITRATE 20 MG PO TABS: 20.0000 mg | ORAL_TABLET | Freq: Every day | ORAL | 3 refills | Status: AC

## 2024-07-30 NOTE — Progress Notes (Signed)
 Patient Care Team: Geofm Glade PARAS, MD as PCP - General (Internal Medicine) Shannon Agent, MD as Consulting Physician (Radiation Oncology) Kristie Lamprey, MD as Consulting Physician (Gastroenterology) Crawford Morna Pickle, NP as Nurse Practitioner (Hematology and Oncology)  DIAGNOSIS:  Encounter Diagnosis  Name Primary?   Malignant neoplasm of upper-outer quadrant of right breast in female, estrogen receptor positive (HCC) Yes    SUMMARY OF ONCOLOGIC HISTORY: Oncology History  Malignant neoplasm of upper-outer quadrant of right breast in female, estrogen receptor positive (HCC)  01/31/2017 Initial Biopsy   Right bresat biopsy: IDC, grade 2, ER+(100%), PR+(25%), Ki-67 25%m HER-2+(ratio 2.35). 1 axillary lymph node was negative for malignancy.    02/02/2017 Initial Diagnosis   Malignant neoplasm of upper-outer quadrant of right breast in female, estrogen receptor positive (HCC)   02/16/2017 - 06/01/2017 Neo-Adjuvant Chemotherapy   Docetaxel , Carboplatin , Trastuzumab , Pertuzumab  x 6  Pertuzumab  omitted with cycle 5 and 6, Docetaxel  omitted with cycle 6.   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   The remaining 45 genes analyzed by [facility]'s 46-gene Common Hereditary Cancers Panel were negative for mutations. Genes analyzed include: APC, ATM, AXIN2, BARD1, BMPR1A, BRCA1, BRCA2, BRIP1, CDH1, CDKN2A, CHEK2, CTNNA1, DICER1, EPCAM, GREM1, HOXB13, KIT, MEN1, MLH1, MSH2, MSH3, MSH6, MUTYH, NBN, NF1, NTHL1, PALB2, PDGFRA, PMS2, POLD1, POLE, PTEN, RAD50, RAD51C, RAD51D, SDHA, SDHB, SDHC, SDHD, SMAD4, SMARCA4, STK11, TP53, TSC1, TSC2, and VHL. The result report is dated for 03/28/2017.   06/22/2017 -  Adjuvant Chemotherapy   Maintenance Trastuzumab  to complete one year    06/27/2017 Surgery   Bilateral nipple sparing Mastectomies with right SLNB: left benign, Right: . 3 foci of residual IDC, 3.2cm, 1.6cm, and 0.7 cm, DCIS, LVI, margins negative, 7 LN negative. ER+(95%), PR+(10%), HER-2+(ratio 2.03).    07/2017 -  Anti-estrogen oral therapy   Tamoxifen  daily     CHIEF COMPLIANT: Follow-up on tamoxifen  therapy  HISTORY OF PRESENT ILLNESS:   History of Present Illness Olivia Frederick is a 53 year old female with breast cancer who presents for follow-up.  She has been on tamoxifen  therapy since 2018 with no significant side effects.  She did not pursue the [employer] blood test due to overwhelming follow-up from the company.     ALLERGIES:  has no known allergies.  MEDICATIONS:  Current Outpatient Medications  Medication Sig Dispense Refill   tamoxifen  (NOLVADEX ) 20 MG tablet TAKE 1 TABLET BY MOUTH DAILY 90 tablet 3   No current facility-administered medications for this visit.    PHYSICAL EXAMINATION: ECOG PERFORMANCE STATUS: 1 - Symptomatic but completely ambulatory  Vitals:   07/30/24 1513  BP: 100/80  Pulse: 63  Resp: 18  Temp: 98.4 F (36.9 C)  SpO2: 100%   Filed Weights   07/30/24 1513  Weight: 149 lb 1.6 oz (67.6 kg)    Physical Exam No palpable lumps or nodules in bilateral reconstructed breasts or axilla  (exam performed in the presence of a chaperone)  LABORATORY DATA:  I have reviewed the data as listed    Latest Ref Rng & Units 04/05/2024    3:24 PM 04/04/2023    3:00 PM 07/26/2022    3:01 PM  CMP  Glucose 70 - 99 mg/dL 99  898  845   BUN 6 - 23 mg/dL 12  10  6   Creatinine 0.40 - 1.20 mg/dL 9.16  9.05  9.27   Sodium 135 - 145 mEq/L 139  138  140   Potassium 3.5 - 5.1 mEq/L 4.0  4.4  3.6   Chloride 96 - 112 mEq/L 104  101  105   CO2 19 - 32 mEq/L 27  28  29    Calcium 8.4 - 10.5 mg/dL 9.4  9.7  9.4   Total Protein 6.0 - 8.3 g/dL 7.3  7.3  7.2   Total Bilirubin 0.2 - 1.2 mg/dL 0.4  0.4  0.5   Alkaline Phos 39  - 117 U/L 33  35  32   AST 0 - 37 U/L 18  17  15    ALT 0 - 35 U/L 13  10  10      Lab Results  Component Value Date   WBC 8.9 04/05/2024   HGB 13.1 04/05/2024   HCT 37.9 04/05/2024   MCV 81.0 04/05/2024   PLT 275.0 04/05/2024   NEUTROABS 5.2 04/05/2024    ASSESSMENT & PLAN:  Malignant neoplasm of upper-outer quadrant of right breast in female, estrogen receptor positive (HCC) 01/24/2017: T2N0 stage Ib grade 2 IDC ER/PR positive HER2 positive Ki-67 25%, CHEK2 gene mutation 06/01/2017: TCHP x6 cycles 06/27/2017: Bilateral mastectomies: Left: No malignancy, right: Residual T2N0 grade 2 IDC implants 02/08/2018: Adjuvant Herceptin  completed Patient declined adjuvant radiation   Current treatment: Tamoxifen  started September 2018 Tamoxifen  toxicities: Tolerating it extremely well without any problems or concerns.  Denies any hot flashes.   Breast cancer surveillance: 1.  Breast exam 07/30/2024: Benign 2. no role of mammograms since she had bilateral mastectomies 3.  [employer]ater[employer] testing for minimal residual disease   She is planning to go to [place] for her 52th birthday. Return to clinic in 1 year for follow-up     No orders of the defined types were placed in this encounter.  The patient has a good understanding of the overall plan. she agrees with it. she will call with any problems that may develop before the next visit here. Total time spent: 30 mins including face to face time and time spent for planning, charting and co-ordination of care   Naomi MARLA Chad, MD 07/30/24

## 2024-07-30 NOTE — Assessment & Plan Note (Signed)
 01/24/2017: T2N0 stage Ib grade 2 IDC ER/PR positive HER2 positive Ki-67 25%, CHEK2 gene mutation 06/01/2017: TCHP x6 cycles 06/27/2017: Bilateral mastectomies: Left: No malignancy, right: Residual T2N0 grade 2 IDC implants 02/08/2018: Adjuvant Herceptin  completed Patient declined adjuvant radiation   Current treatment: Tamoxifen  started September 2018 Tamoxifen  toxicities: Tolerating it extremely well without any problems or concerns.  Denies any hot flashes.   Breast cancer surveillance: 1.  Breast exam 07/30/2024: Benign 2. no role of mammograms since she had bilateral mastectomies 3.  Signatera testing for minimal residual disease   She is planning to go to [place] for her 52th birthday. Return to clinic in 1 year for follow-up

## 2025-07-31 ENCOUNTER — Telehealth: Admitting: Hematology and Oncology
